# Patient Record
Sex: Male | Born: 1950
Health system: Southern US, Community
[De-identification: ages and names within clinical notes are randomized; demographics above are authoritative.]

## PROBLEM LIST (undated history)

## (undated) DIAGNOSIS — L719 Rosacea, unspecified: Secondary | ICD-10-CM

## (undated) DIAGNOSIS — N400 Enlarged prostate without lower urinary tract symptoms: Secondary | ICD-10-CM

## (undated) DIAGNOSIS — I5020 Unspecified systolic (congestive) heart failure: Secondary | ICD-10-CM

## (undated) DIAGNOSIS — K2100 Gastro-esophageal reflux disease with esophagitis, without bleeding: Secondary | ICD-10-CM

## (undated) DIAGNOSIS — Z87442 Personal history of urinary calculi: Secondary | ICD-10-CM

## (undated) DIAGNOSIS — N200 Calculus of kidney: Secondary | ICD-10-CM

## (undated) DIAGNOSIS — Z8619 Personal history of other infectious and parasitic diseases: Secondary | ICD-10-CM

## (undated) DIAGNOSIS — H409 Unspecified glaucoma: Secondary | ICD-10-CM

## (undated) HISTORY — PX: TONSILLECTOMY: SUR1361

## (undated) HISTORY — DX: Gastro-esophageal reflux disease with esophagitis: K21.0

## (undated) HISTORY — PX: TONSILLECTOMY AND ADENOIDECTOMY: SHX28

## (undated) HISTORY — PX: COLON SURGERY: SHX602

## (undated) HISTORY — DX: Rosacea, unspecified: L71.9

## (undated) HISTORY — DX: Gastro-esophageal reflux disease with esophagitis, without bleeding: K21.00

## (undated) HISTORY — DX: Unspecified systolic (congestive) heart failure: I50.20

## (undated) HISTORY — DX: Benign prostatic hyperplasia without lower urinary tract symptoms: N40.0

## (undated) HISTORY — DX: Calculus of kidney: N20.0

## (undated) HISTORY — DX: Unspecified glaucoma: H40.9

## (undated) HISTORY — PX: COLONOSCOPY: SHX174

---
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
## 1992-08-26 HISTORY — PX: KNEE SURGERY: SHX244

## 2003-08-27 HISTORY — PX: CHOLECYSTECTOMY: SHX55

## 2003-10-17 ENCOUNTER — Encounter: Admission: RE | Admit: 2003-10-17 | Discharge: 2003-10-17 | Payer: Self-pay | Admitting: Family Medicine

## 2003-12-12 ENCOUNTER — Observation Stay (HOSPITAL_COMMUNITY): Admission: RE | Admit: 2003-12-12 | Discharge: 2003-12-13 | Payer: Self-pay | Admitting: Surgery

## 2003-12-19 ENCOUNTER — Observation Stay (HOSPITAL_COMMUNITY): Admission: EM | Admit: 2003-12-19 | Discharge: 2003-12-20 | Payer: Self-pay | Admitting: Emergency Medicine

## 2004-08-13 ENCOUNTER — Ambulatory Visit: Payer: Self-pay | Admitting: Family Medicine

## 2004-08-26 HISTORY — PX: RIGHT COLECTOMY: SHX853

## 2005-07-22 ENCOUNTER — Ambulatory Visit: Payer: Self-pay | Admitting: General Surgery

## 2005-08-07 ENCOUNTER — Inpatient Hospital Stay: Payer: Self-pay | Admitting: General Surgery

## 2006-03-26 ENCOUNTER — Ambulatory Visit: Payer: Self-pay | Admitting: Gastroenterology

## 2006-08-08 ENCOUNTER — Ambulatory Visit: Payer: Self-pay | Admitting: General Surgery

## 2007-05-03 DIAGNOSIS — B351 Tinea unguium: Secondary | ICD-10-CM | POA: Insufficient documentation

## 2008-08-26 HISTORY — PX: UPPER GI ENDOSCOPY: SHX6162

## 2008-08-26 HISTORY — PX: OTHER SURGICAL HISTORY: SHX169

## 2008-12-05 ENCOUNTER — Ambulatory Visit: Payer: Self-pay | Admitting: General Surgery

## 2008-12-05 HISTORY — PX: OTHER SURGICAL HISTORY: SHX169

## 2008-12-13 ENCOUNTER — Ambulatory Visit: Payer: Self-pay | Admitting: General Surgery

## 2008-12-13 LAB — HM COLONOSCOPY

## 2009-01-02 ENCOUNTER — Ambulatory Visit: Payer: Self-pay | Admitting: General Surgery

## 2009-01-12 ENCOUNTER — Ambulatory Visit: Payer: Self-pay | Admitting: General Surgery

## 2009-05-09 ENCOUNTER — Ambulatory Visit: Payer: Self-pay | Admitting: General Surgery

## 2009-05-10 DIAGNOSIS — K222 Esophageal obstruction: Secondary | ICD-10-CM | POA: Insufficient documentation

## 2009-05-23 ENCOUNTER — Ambulatory Visit: Payer: Self-pay | Admitting: General Surgery

## 2010-09-18 IMAGING — CT CT ABD-PELV W/ CM
1 of 3 series · 13 of 32 positions shown, 18 images · non-contrast
Comparison: none

REASON FOR EXAM: ABD WALL MASS HX COLON CA [id number]
COMMENTS:

[Series 2: abdomen · axial · 0.83mm/px · z∈[-199,+191]mm · 13 of 90 slices shown, 18 images]
[im 6/90  soft-tissue]
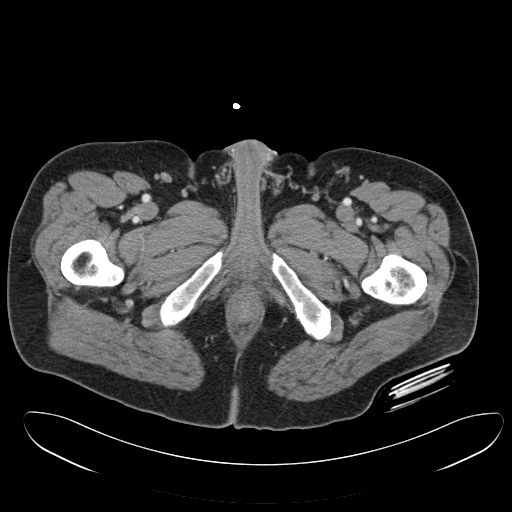
[im 6/90  bone]
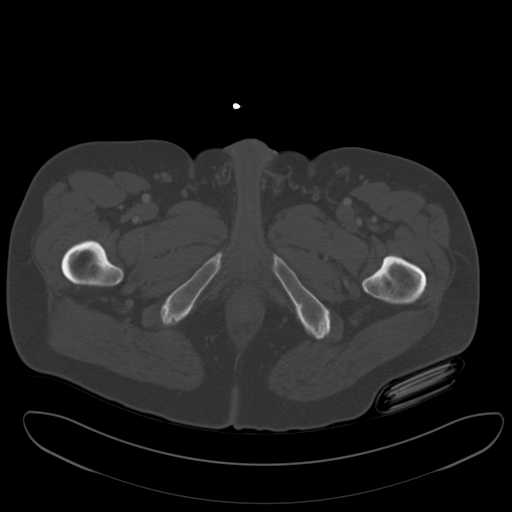
[im 16/90  soft-tissue]
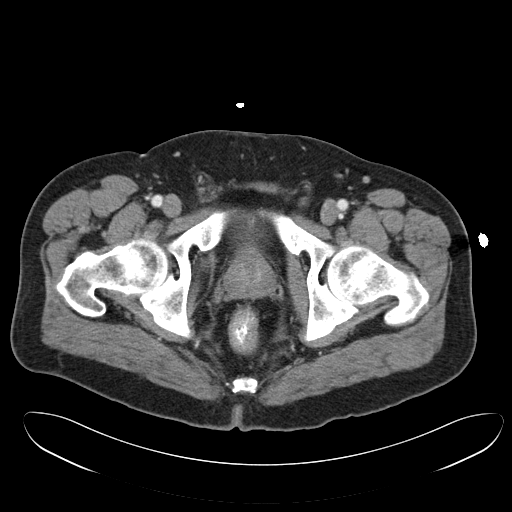
[im 21/90  soft-tissue]
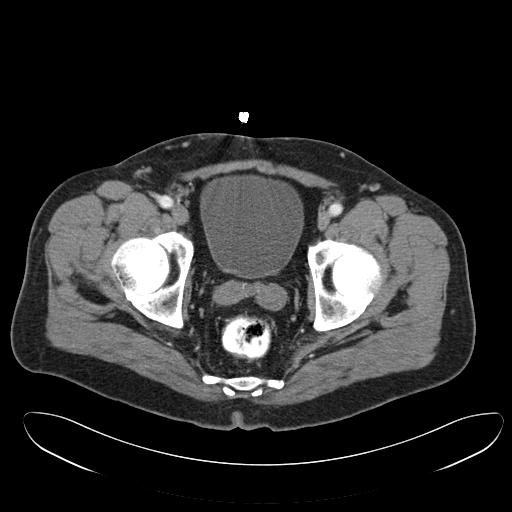
[im 27/90  soft-tissue]
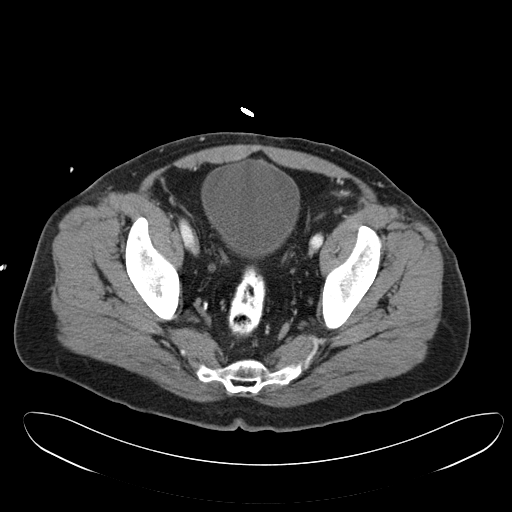
[im 37/90  soft-tissue]
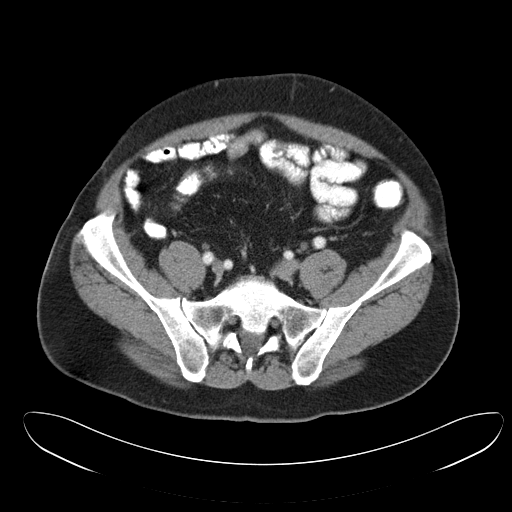
[im 42/90  soft-tissue]
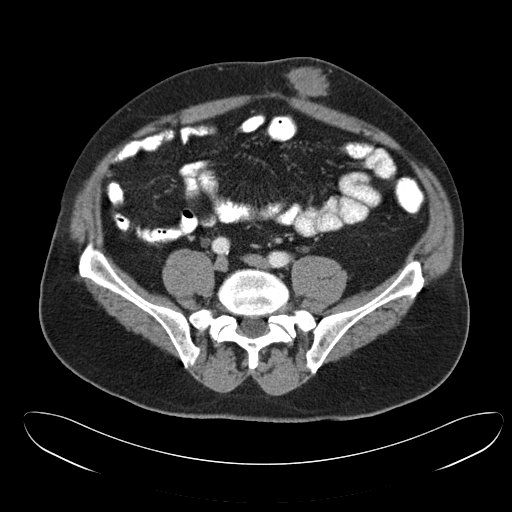
[im 48/90  soft-tissue]
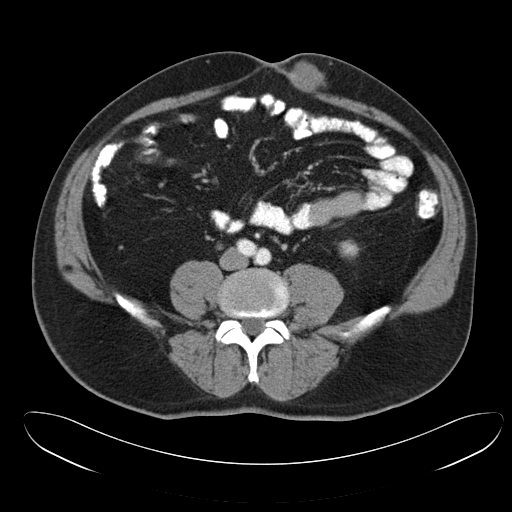
[im 58/90  soft-tissue]
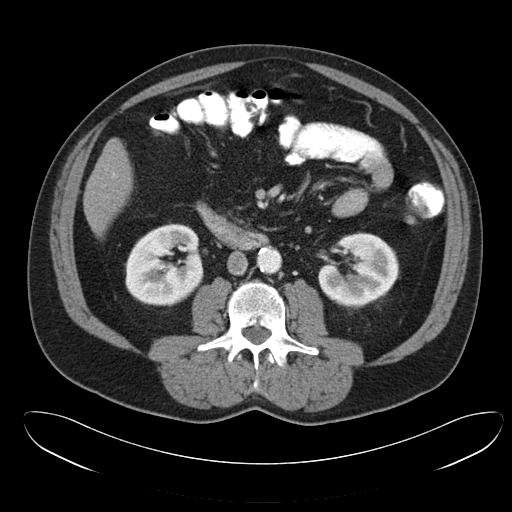
[im 63/90  soft-tissue]
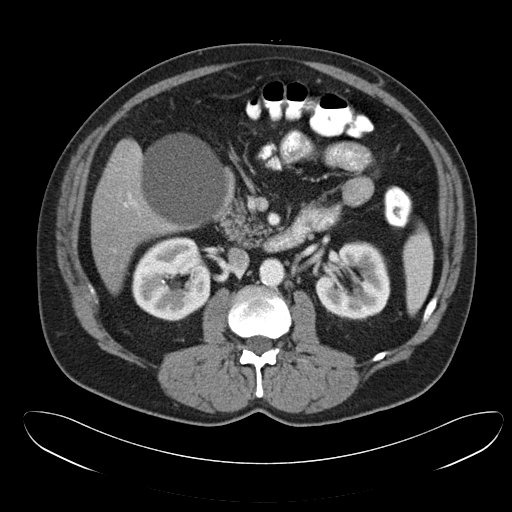
[im 63/90  bone]
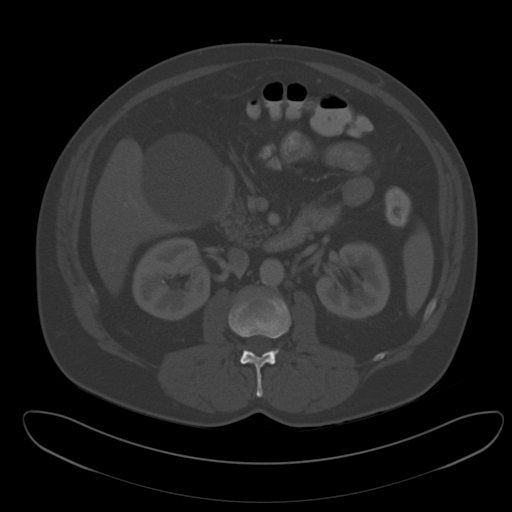
[im 69/90  soft-tissue]
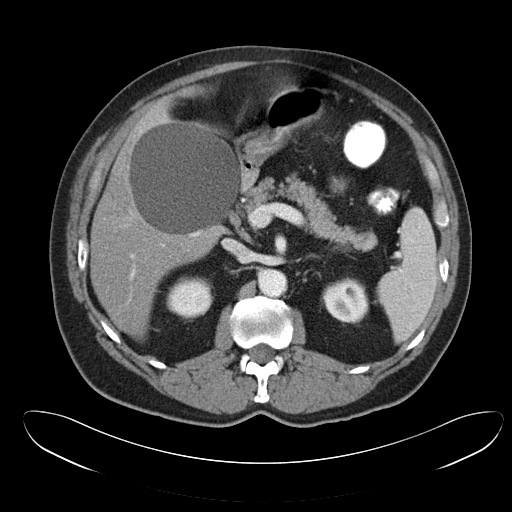
[im 69/90  lung]
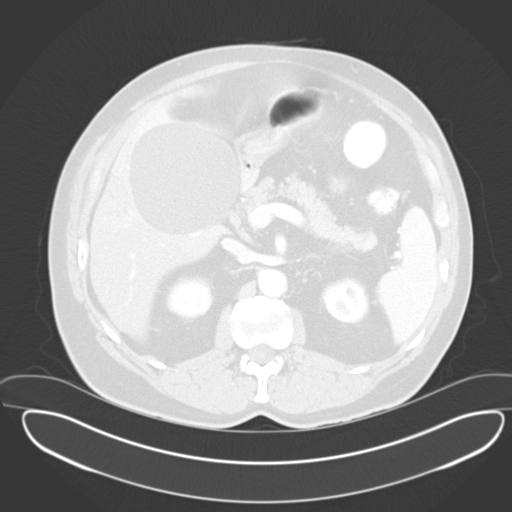
[im 74/90  lung]
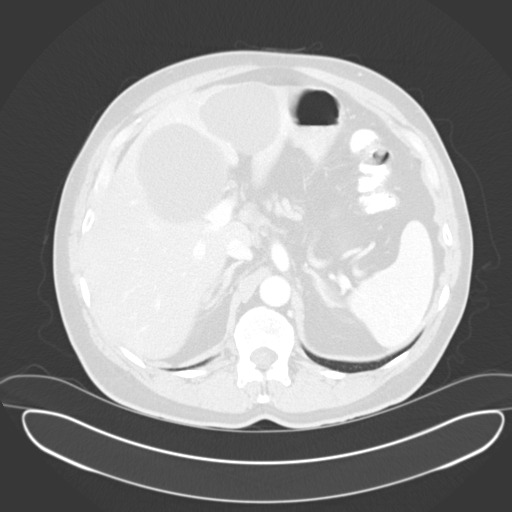
[im 79/90  soft-tissue]
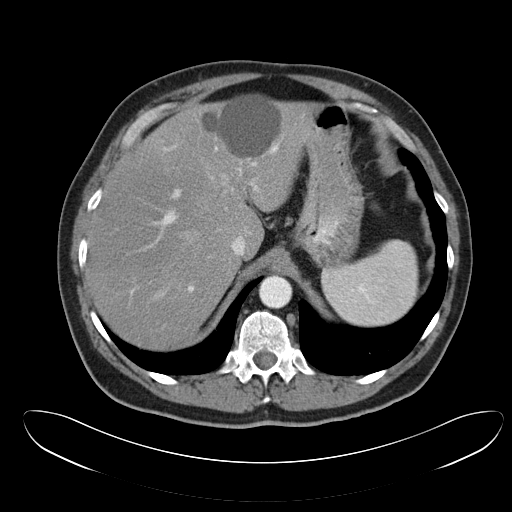
[im 79/90  lung]
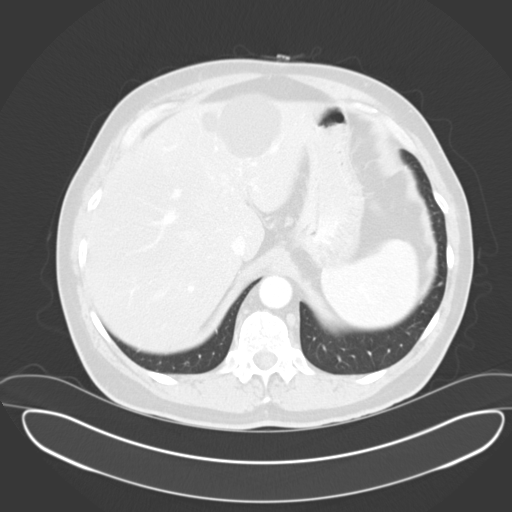
[im 84/90  soft-tissue]
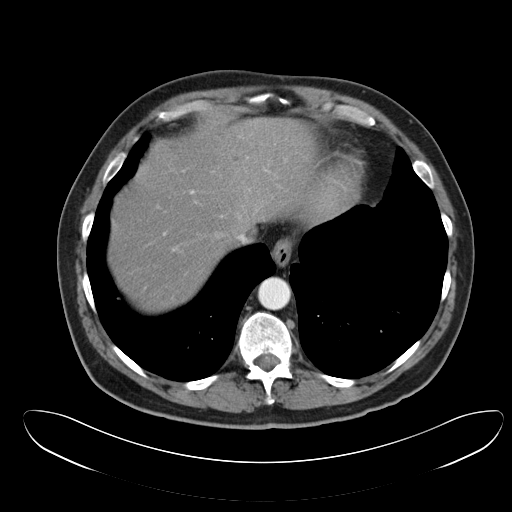
[im 84/90  lung]
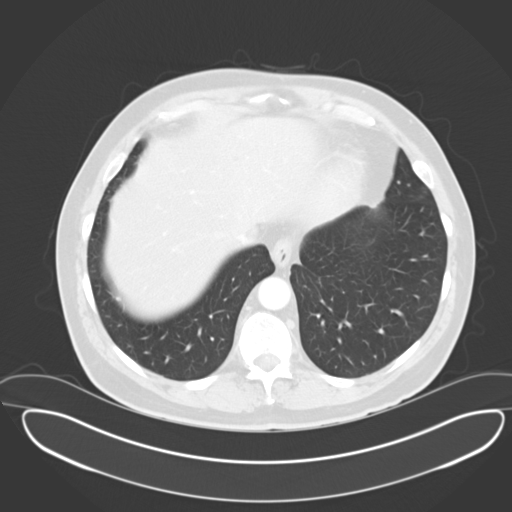

[13 of 32 positions shown; findings below may reference images not displayed]

PROCEDURE:     CT  - CT ABDOMEN / PELVIS  W  - December 05, 2008  [DATE]

RESULT:     Helical, 5 mm sections were obtained from the lung bases through
the pubic symphysis status post intravenous administration of 100 ml of
Dsovue-AZR and oral contrast.

Evaluation of the lung bases demonstrates no gross abnormalities.

The liver demonstrates multiple, low attenuating foci scattered throughout
the liver. Immediate IV and delayed imaging was obtained of the liver. The
areas in question demonstrate Hounsfield units on the post and delayed
imaging consistent with cysts and likely represent large liver cysts. The
largest measures 9 x 9 cm and projects within the inferior aspect of the
right lobe of the liver. No further liver masses are identified.

The spleen, adrenals, pancreas and kidneys are unremarkable. There is no CT
evidence of intra-abdominal free fluid, drainable loculated fluid
collections, masses or adenopathy or evidence of an abdominal aortic
aneurysm. A 2.42 x 3.98 cm soft tissue appearing mass projects just lateral
to the umbilicus on the left. This area demonstrates Hounsfield units of 40
and 57. Considering the patient's history, differential considerations are a
post op seroma complex or a resolving hematoma, if clinically appropriate,
if chronologically appropriate with the patient's surgical history.
Alternatively, more ominous etiologies such as a soft tissue mass
particularly considering the patient's history of colon neoplasm cannot be
excluded. No further masses, free fluid or drainable, loculated fluid
collections are identified. Evaluation of the pelvis demonstrates no
evidence of free fluid, drainable loculated fluid collections, masses or
adenopathy. There is no CT evidence of bowel obstruction or diverticulitis.
IMPRESSION: 1.     Soft tissue mass projecting in the region of palpable concern.
Differential considerations are benign etiologies which may be primarily
post surgical though more ominous etiologies as described above,
particularly if chronologically the benign etiologies do not appear
appropriate is also a diagnostic consideration.
2.     Cysts involving the liver.
3.     No further focal or acute abnormalities.
4.     If clinically warranted, tissue sampling of the periumbilical mass is
recommended.

## 2012-02-13 LAB — HM COLONOSCOPY: HM Colonoscopy: NORMAL

## 2013-03-11 ENCOUNTER — Encounter: Payer: Self-pay | Admitting: *Deleted

## 2014-07-06 ENCOUNTER — Ambulatory Visit: Payer: Self-pay | Admitting: Family Medicine

## 2014-07-11 ENCOUNTER — Ambulatory Visit: Payer: Self-pay | Admitting: Internal Medicine

## 2014-07-20 ENCOUNTER — Ambulatory Visit: Payer: Self-pay | Admitting: Interventional Cardiology

## 2014-07-25 DIAGNOSIS — E782 Mixed hyperlipidemia: Secondary | ICD-10-CM | POA: Insufficient documentation

## 2014-07-26 HISTORY — PX: CARDIAC CATHETERIZATION: SHX172

## 2014-08-26 DIAGNOSIS — I5020 Unspecified systolic (congestive) heart failure: Secondary | ICD-10-CM

## 2014-08-26 HISTORY — DX: Unspecified systolic (congestive) heart failure: I50.20

## 2014-12-12 DIAGNOSIS — I10 Essential (primary) hypertension: Secondary | ICD-10-CM | POA: Insufficient documentation

## 2014-12-14 LAB — HEPATIC FUNCTION PANEL
ALT: 36 U/L (ref 10–40)
AST: 24 U/L (ref 14–40)

## 2014-12-14 LAB — PSA: PSA: 2.3

## 2014-12-14 LAB — BASIC METABOLIC PANEL
BUN: 15 mg/dL (ref 4–21)
Creatinine: 0.8 mg/dL (ref 0.6–1.3)
GLUCOSE: 102 mg/dL
POTASSIUM: 4.9 mmol/L (ref 3.4–5.3)
SODIUM: 137 mmol/L (ref 137–147)

## 2014-12-14 LAB — CBC AND DIFFERENTIAL
HCT: 43 % (ref 41–53)
Hemoglobin: 15 g/dL (ref 13.5–17.5)
PLATELETS: 316 10*3/uL (ref 150–399)
WBC: 7.1 10^3/mL

## 2014-12-14 LAB — LIPID PANEL
Cholesterol: 173 mg/dL (ref 0–200)
HDL: 57 mg/dL (ref 35–70)
LDL CALC: 94 mg/dL
Triglycerides: 111 mg/dL (ref 40–160)

## 2014-12-14 LAB — TSH: TSH: 1.38 u[IU]/mL (ref 0.41–5.90)

## 2015-01-30 ENCOUNTER — Other Ambulatory Visit: Payer: Self-pay | Admitting: Family Medicine

## 2015-04-27 ENCOUNTER — Ambulatory Visit (INDEPENDENT_AMBULATORY_CARE_PROVIDER_SITE_OTHER): Payer: BLUE CROSS/BLUE SHIELD | Admitting: Family Medicine

## 2015-04-27 ENCOUNTER — Encounter: Payer: Self-pay | Admitting: Family Medicine

## 2015-04-27 VITALS — BP 122/74 | HR 54 | Temp 98.8°F | Resp 16 | Ht 73.0 in | Wt 223.0 lb

## 2015-04-27 DIAGNOSIS — Z85038 Personal history of other malignant neoplasm of large intestine: Secondary | ICD-10-CM | POA: Insufficient documentation

## 2015-04-27 DIAGNOSIS — R0602 Shortness of breath: Secondary | ICD-10-CM | POA: Insufficient documentation

## 2015-04-27 DIAGNOSIS — Z8619 Personal history of other infectious and parasitic diseases: Secondary | ICD-10-CM

## 2015-04-27 DIAGNOSIS — R0609 Other forms of dyspnea: Secondary | ICD-10-CM

## 2015-04-27 DIAGNOSIS — I5022 Chronic systolic (congestive) heart failure: Secondary | ICD-10-CM

## 2015-04-27 DIAGNOSIS — R5383 Other fatigue: Secondary | ICD-10-CM | POA: Insufficient documentation

## 2015-04-27 DIAGNOSIS — J329 Chronic sinusitis, unspecified: Secondary | ICD-10-CM

## 2015-04-27 DIAGNOSIS — R079 Chest pain, unspecified: Secondary | ICD-10-CM | POA: Insufficient documentation

## 2015-04-27 DIAGNOSIS — Z87442 Personal history of urinary calculi: Secondary | ICD-10-CM | POA: Insufficient documentation

## 2015-04-27 DIAGNOSIS — N400 Enlarged prostate without lower urinary tract symptoms: Secondary | ICD-10-CM | POA: Insufficient documentation

## 2015-04-27 DIAGNOSIS — I5020 Unspecified systolic (congestive) heart failure: Secondary | ICD-10-CM | POA: Insufficient documentation

## 2015-04-27 DIAGNOSIS — H9319 Tinnitus, unspecified ear: Secondary | ICD-10-CM | POA: Insufficient documentation

## 2015-04-27 HISTORY — DX: Personal history of other infectious and parasitic diseases: Z86.19

## 2015-04-27 MED ORDER — AMOXICILLIN 500 MG PO CAPS: 1000.0000 mg | ORAL_CAPSULE | Freq: Two times a day (BID) | ORAL | Status: AC

## 2015-04-27 NOTE — Progress Notes (Signed)
Patient: Derek Jackson Male    DOB: [birth year]   64 y.o.   MRN: [id number] Visit Date: 04/27/2015  Today's Provider: Lelon Huh, MD   Chief Complaint  Patient presents with  . Cough  . Sinusitis   Subjective:    Cough This is a new problem. The current episode started 1 to 4 weeks ago (3 weeks ago). The problem has been gradually worsening. The problem occurs every few minutes. The cough is productive of sputum. Associated symptoms include headaches, a sore throat and wheezing. Pertinent negatives include no chest pain, chills, ear congestion, ear pain, fever, shortness of breath or sweats. The symptoms are aggravated by other (first thing in the morning). He has tried OTC cough suppressant for the symptoms. The treatment provided mild relief.  Sinusitis Associated symptoms include coughing, headaches, sinus pressure and a sore throat. Pertinent negatives include no chills, ear pain or shortness of breath.      No Known Allergies Previous Medications   LISINOPRIL (PRINIVIL,ZESTRIL) 20 MG TABLET    Take 1 tablet by mouth daily.   METOPROLOL TARTRATE (LOPRESSOR) 25 MG TABLET    Take 1 tablet by mouth 2 (two) times daily.   RANITIDINE (ZANTAC) 300 MG TABLET    TAKE ONE TABLET BY MOUTH TWICE DAILY   SPIRONOLACTONE (ALDACTONE) 25 MG TABLET    Take 25 mg by mouth daily.   TERBINAFINE (LAMISIL) 250 MG TABLET    Take 2 tablets by mouth. Daily for 7 days each month    Review of Systems  Constitutional: Negative for fever and chills.  HENT: Positive for sinus pressure and sore throat. Negative for ear pain and trouble swallowing.   Respiratory: Positive for cough and wheezing. Negative for shortness of breath.   Cardiovascular: Negative for chest pain and palpitations.  Neurological: Positive for light-headedness and headaches.    Social History  Substance Use Topics  . Smoking status: Former Smoker    Quit date: 04/26/1990  . Smokeless tobacco: Never Used  . Alcohol Use:  0.0 oz/week    0 Standard drinks or equivalent per week   Objective:   BP 122/74 mmHg  Pulse 54  Temp(Src) 98.8 F (37.1 C) (Oral)  Resp 16  Ht 6\' 1"  (1.854 m)  Wt 223 lb (101.152 kg)  BMI 29.43 kg/m2  SpO2 96%  Physical Exam  General Appearance:    Alert, cooperative, no distress  HENT:   bilateral TM normal without fluid or infection, neck without nodes, throat normal without erythema or exudate and bilateral frontal sinus tender. Bilateral frontal sinus tenderness.   Eyes:    PERRL, conjunctiva/corneas clear, EOM's intact       Lungs:     Clear to auscultation bilaterally, respirations unlabored  Heart:    Regular rate and rhythm  Neurologic:   Awake, alert, oriented x 3. No apparent focal neurological           defect.           Assessment & Plan:     1. Sinusitis, unspecified chronicity, unspecified location  - amoxicillin (AMOXIL) 500 MG capsule; Take 2 capsules (1,000 mg total) by mouth 2 (two) times daily.  Dispense: 40 capsule; Refill: 0  2. Chronic systolic heart failure He was prescribed aldactone by Dr. Nehemiah Massed last week and is supposed to have renal functions checked after 4 weeks, He prefers to have it drawn at [place] we can order lab around September 25th if he  likes, and will send copy to Dr. Nehemiah Massed.        Lelon Huh, MD  [facility]

## 2015-05-15 ENCOUNTER — Telehealth: Payer: Self-pay | Admitting: Family Medicine

## 2015-05-15 ENCOUNTER — Other Ambulatory Visit: Payer: Self-pay | Admitting: Family Medicine

## 2015-05-15 DIAGNOSIS — I5022 Chronic systolic (congestive) heart failure: Secondary | ICD-10-CM

## 2015-05-15 NOTE — Telephone Encounter (Signed)
Please advise patient it is time to check kidney function panel. Have printed lab order. Please leave at front desk for patient to pick up. Does not need to fast.

## 2015-05-15 NOTE — Telephone Encounter (Signed)
-----   Message from Birdie Sons, MD sent at 04/27/2015 10:29 AM EDT ----- Regarding: make sure checks renal panel before 9-25 Since Dr. Raliegh Ip started on spironolactone

## 2015-05-16 NOTE — Telephone Encounter (Signed)
Printed lab requisitions as directed below and placed at front desk.  Advised pt as directed below, pt verbalized fully understanding.  Thanks,

## 2015-05-17 LAB — RENAL FUNCTION PANEL
Albumin: 4.4 g/dL (ref 3.6–4.8)
BUN/Creatinine Ratio: 19 (ref 10–22)
BUN: 14 mg/dL (ref 8–27)
CHLORIDE: 98 mmol/L (ref 97–108)
CO2: 24 mmol/L (ref 18–29)
Calcium: 9.9 mg/dL (ref 8.6–10.2)
Creatinine, Ser: 0.75 mg/dL — ABNORMAL LOW (ref 0.76–1.27)
GFR, EST AFRICAN AMERICAN: 112 mL/min/{1.73_m2} (ref >59)
GFR, EST NON AFRICAN AMERICAN: 97 mL/min/{1.73_m2} (ref >59)
GLUCOSE: 118 mg/dL — AB (ref 65–99)
PHOSPHORUS: 2.8 mg/dL (ref 2.5–4.5)
POTASSIUM: 4.5 mmol/L (ref 3.5–5.2)
SODIUM: 137 mmol/L (ref 134–144)

## 2015-06-09 ENCOUNTER — Other Ambulatory Visit: Payer: Self-pay | Admitting: Family Medicine

## 2015-11-23 ENCOUNTER — Other Ambulatory Visit: Payer: Self-pay

## 2015-11-23 MED ORDER — RANITIDINE HCL 300 MG PO TABS: 300.0000 mg | ORAL_TABLET | Freq: Two times a day (BID) | ORAL | Status: DC

## 2015-11-23 NOTE — Telephone Encounter (Signed)
Chris from [facility] is requesting Zantac to be filled for patient. He reports the reason they did not send a e-fill is because he is new to the pharmacy. Last time medication was filled was 12/14/2014. Patient is requesting 3 month supply qty of 180 capsules. Thanks!

## 2015-12-18 ENCOUNTER — Encounter: Payer: Self-pay | Admitting: Family Medicine

## 2015-12-18 ENCOUNTER — Ambulatory Visit (INDEPENDENT_AMBULATORY_CARE_PROVIDER_SITE_OTHER): Payer: PPO | Admitting: Family Medicine

## 2015-12-18 VITALS — BP 94/60 | HR 63 | Temp 98.3°F | Resp 16 | Ht 73.5 in | Wt 224.0 lb

## 2015-12-18 DIAGNOSIS — Z0000 Encounter for general adult medical examination without abnormal findings: Secondary | ICD-10-CM | POA: Diagnosis not present

## 2015-12-18 DIAGNOSIS — R0609 Other forms of dyspnea: Secondary | ICD-10-CM

## 2015-12-18 DIAGNOSIS — Z125 Encounter for screening for malignant neoplasm of prostate: Secondary | ICD-10-CM

## 2015-12-18 DIAGNOSIS — K2100 Gastro-esophageal reflux disease with esophagitis, without bleeding: Secondary | ICD-10-CM

## 2015-12-18 DIAGNOSIS — R5382 Chronic fatigue, unspecified: Secondary | ICD-10-CM

## 2015-12-18 DIAGNOSIS — Z23 Encounter for immunization: Secondary | ICD-10-CM

## 2015-12-18 DIAGNOSIS — I5022 Chronic systolic (congestive) heart failure: Secondary | ICD-10-CM | POA: Diagnosis not present

## 2015-12-18 NOTE — Progress Notes (Signed)
Patient: Derek Jackson, Male    DOB: 1951/01/19, 65 y.o.   MRN: [id number] Visit Date: 12/18/2015  Today's Provider: Lelon Huh, MD   Chief Complaint  Patient presents with  . Annual Exam  . Gastroesophageal Reflux    follow up  . Congestive Heart Failure    follow up  . Benign Prostatic Hypertrophy    follow up   Subjective:   Yearly physical   Derek Jackson is a 65 y.o. male who presents today for his Annual fphysical. He feels fairly well. He reports exercising 4 days a week walking. He reports he is sleeping well.   Follow up Reflux: Last office visit was 1 year ago and no changes were made. Patient reports good compliance with treatment. He states he retired last month, but is still working 3 days a week. He has been more fatigued lately with shortness of breath on exertion. He states Dr. Nehemiah Massed has scheduled echo, aortic ultrasound and stress test.   Follow up CHF: Last office visit was 7 months ago and no changes were made. Patient reports that he has a follow up appointment to see Dr. Nehemiah Massed soon. He states that they will be doing a  Stress test on him.   Review of Systems  Constitutional: Positive for activity change, appetite change and fatigue. Negative for fever and chills.  HENT: Positive for hearing loss, nosebleeds, rhinorrhea, sinus pressure and sneezing. Negative for congestion, ear pain and trouble swallowing.   Eyes: Negative for pain and visual disturbance.  Respiratory: Positive for shortness of breath. Negative for cough and chest tightness.   Cardiovascular: Negative for chest pain, palpitations and leg swelling.  Gastrointestinal: Positive for abdominal distention. Negative for nausea, vomiting, abdominal pain, diarrhea, constipation and blood in stool.  Endocrine: Negative for polydipsia, polyphagia and polyuria.  Genitourinary: Positive for difficulty urinating. Negative for dysuria and flank pain.  Musculoskeletal: Negative for  myalgias, back pain, joint swelling, arthralgias and neck stiffness.  Skin: Negative for color change, rash and wound.  Neurological: Positive for weakness. Negative for dizziness, tremors, seizures, speech difficulty, light-headedness and headaches.  Psychiatric/Behavioral: Negative for behavioral problems, confusion, sleep disturbance, dysphoric mood and decreased concentration. The patient is not nervous/anxious.   All other systems reviewed and are negative.   Social History   Social History  . Marital Status: Single    Spouse Name: N/A  . Number of Children: 3  . Years of Education: N/A   Occupational History  . Employed     Works at [employer]  . Smoking status: Former Smoker -- 1.00 packs/day for 8 years    Types: Cigarettes    Quit date: 04/26/1990  . Smokeless tobacco: Never Used  . Alcohol Use: No  . Drug Use: Yes  . Sexual Activity: Not on file   Other Topics Concern  . Not on file   Social History Narrative    Past Medical History  Diagnosis Date  . Stroke ([place])   . Kidney stone   . Systolic CHF ([place])   . BPH (benign prostatic hypertrophy)   . Esophagitis, reflux   . Rosacea      Patient Active Problem List   Diagnosis Date Noted  . BPH (benign prostatic hypertrophy) 04/27/2015  . Chest pain 04/27/2015  . Dyspnea on exertion 04/27/2015  . Fatigue 04/27/2015  . History of colon cancer 04/27/2015  . History of shingles 04/27/2015  .  History of kidney stones 04/27/2015  . Systolic CHF ([place]) 0000000  . Tinnitus 04/27/2015  . Stricture and stenosis of esophagus 05/10/2009  . Fam hx-ischem heart disease 07/13/2008  . Dermatophytosis, nail 05/03/2007  . Adjustment disorder with mixed anxiety and depressed mood 09/09/2006  . Esophagitis, reflux 06/20/2006  . Rosacea 06/20/2006    Past Surgical History  Procedure Laterality Date  . Upper gi endoscopy  2010  . Colonoscopy  2010  . Knee surgery  1994      Arthroscopy. Surgery by Dr. Marry Guan  . Abdominal wall mass excision  2010    Dr. Bary Castilla  . Right colectomy  2006    Dr.Byrnett  . Cholecystectomy  2005    Lapraroscopic  . Tonsillectomy and adenoidectomy  1960's  . Cardiac catheterization  07/2014    No blockages. per patient one side of heart was not functioning well, CHF  . Ct scan of abdomen  12/05/2008    Soft Tissue mass 2.42cm x 3.98 cm of Abdominal wall lateral to umbilicus, pathology shows fibromatosis. Multiple large liver cysts up to 9cm x 9cm    His family history includes Aneurysm in his brother; CAD in his father; Colon polyps in his father; Congestive Heart Failure in his mother; Dementia in his father; Diabetes in his father and paternal grandmother; Heart attack in his maternal grandfather and paternal grandfather; Heart disease in his brother; Stroke in his father.    Previous Medications   LISINOPRIL (PRINIVIL,ZESTRIL) 20 MG TABLET    Take 1 tablet by mouth daily.   METOPROLOL TARTRATE (LOPRESSOR) 25 MG TABLET    Take 1 tablet by mouth 2 (two) times daily.   RANITIDINE (ZANTAC) 300 MG TABLET    Take 1 tablet (300 mg total) by mouth 2 (two) times daily.   SPIRONOLACTONE (ALDACTONE) 25 MG TABLET    Take 25 mg by mouth daily.    Patient Care Team: Birdie Sons, MD as PCP - General (Family Medicine) Corey Skains, MD as Consulting Physician (Internal Medicine) Robert Bellow, MD (General Surgery)     Objective:   Vitals: BP 94/60 mmHg  Pulse 63  Temp(Src) 98.3 F (36.8 C) (Oral)  Resp 16  Ht 6' 1.5" (1.867 m)  Wt 224 lb (101.606 kg)  BMI 29.15 kg/m2  SpO2 96%  Physical Exam   General Appearance:    Alert, cooperative, no distress, appears stated age  Head:    Normocephalic, without obvious abnormality, atraumatic  Eyes:    PERRL, conjunctiva/corneas clear, EOM's intact, fundi    benign, both eyes       Ears:    Normal TM's and external ear canals, both ears  Nose:   Nares normal, septum  midline, mucosa normal, no drainage   or sinus tenderness  Throat:   Lips, mucosa, and tongue normal; teeth and gums normal  Neck:   Supple, symmetrical, trachea midline, no adenopathy;       thyroid:  No enlargement/tenderness/nodules; no carotid   bruit or JVD  Back:     Symmetric, no curvature, ROM normal, no CVA tenderness  Lungs:     Clear to auscultation bilaterally, respirations unlabored  Chest wall:    No tenderness or deformity  Heart:    Regular rate and rhythm, S1 and S2 normal, no murmur, rub   or gallop  Abdomen:     Soft, non-tender, bowel sounds active all four quadrants,    no masses, no organomegaly  Genitalia:  deferred  Rectal:    deferred  Extremities:   Extremities normal, atraumatic, no cyanosis or edema  Pulses:   2+ and symmetric all extremities  Skin:   Skin color, texture, turgor normal, no rashes or lesions  Lymph nodes:   Cervical, supraclavicular, and axillary nodes normal  Neurologic:   CNII-XII intact. Normal strength, sensation and reflexes      throughout     Visual Acuity Screening   Right eye Left eye Both eyes  Without correction: 20/40 20/40 20/30   With correction: 20/20 20/13 20/13   Comments: Patient saw all colors   Activities of Daily Living In your present state of health, do you have any difficulty performing the following activities: 12/18/2015  Hearing? N  Vision? N  Difficulty concentrating or making decisions? Y  Walking or climbing stairs? Y  Dressing or bathing? N  Doing errands, shopping? N    Fall Risk Assessment Fall Risk  12/18/2015  Falls in the past year? No     Depression Screen PHQ 2/9 Scores 12/18/2015  PHQ - 2 Score 2  PHQ- 9 Score 4    Cognitive Testing - 6-CIT  Correct? Score   What year is it? yes 0 0 or 4  What month is it? yes 0 0 or 3  Memorize:    Pia Mau,  41,  [place],      What time is it? (within 1 hour) yes 0 0 or 3  Count backwards from 20 yes 0 0, 2, or 4  Name the months  of the year yes 0 0, 2, or 4  Repeat name & address above no 3 0, 2, 4, 6, 8, or 10       TOTAL SCORE  3/28   Interpretation:  Normal  Normal (0-7) Abnormal (8-28)    Audit-C Alcohol Use Screening  Question Answer Points  How often do you have alcoholic drink? 1 times monthly 1  On days you do drink alcohol, how many drinks do you typically consume? 1 or 2 0  How oftey will you drink 6 or more in a total? never 0  Total Score:  1   A score of 3 or more in women, and 4 or more in men indicates increased risk for alcohol abuse, EXCEPT if all of the points are from question 1.   Current Exercise Habits: Home exercise routine, Type of exercise: walking, Time (Minutes): 40, Frequency (Times/Week): 4, Weekly Exercise (Minutes/Week): 160, Intensity: Moderate Exercise limited by: None identified   Assessment & Plan:    Health maintenence Reviewed patient's Family Medical History Reviewed and updated list of patient's medical providers Assessment of cognitive impairment was done Assessed patient's functional ability Established a written schedule for health screening [place] Completed and Reviewed  Exercise Activities and Dietary recommendations Goals    None      Immunization History  Administered Date(s) Administered  . Td 11/25/2003  . Zoster 10/22/2010    Health Maintenance  Topic Date Due  . HIV Screening  05/26/1965  . TETANUS/TDAP  11/24/2013  . INFLUENZA VACCINE  03/26/2016  . PNA vac Low Risk Adult (2 of 2 - PPSV23) 12/17/2016  . COLONOSCOPY  02/12/2017  . ZOSTAVAX  Completed  . Hepatitis C Screening  Completed      Discussed health benefits of physical activity, and encouraged him to engage in regular exercise appropriate for his age and condition.    ------------------------------------------------------------------------------------------------------------  1. Annual physical  exam  - Comprehensive metabolic panel  2. Chronic  systolic congestive heart failure (HCC) Asymptomatic. Compliant with medication.  Continue aggressive risk factor modification.  Continue routine follow up with Dr, Nehemiah Massed - Lipid panel  3. Prostate cancer screening   - PSA  4. Dyspnea on exertion Is already scheduled for echo and cardiology follow up next week.   5. Esophagitis, reflux States that ranitidine continues to work well .   6. Chronic fatigue  - CBC - TSH  7. Need for pneumococcal vaccination  - Pneumococcal conjugate vaccine 13-valent IM

## 2015-12-19 LAB — TSH: TSH: 1.88 u[IU]/mL (ref 0.450–4.500)

## 2015-12-19 LAB — COMPREHENSIVE METABOLIC PANEL
A/G RATIO: 2.3 — AB (ref 1.2–2.2)
ALT: 50 IU/L — AB (ref 0–44)
AST: 33 IU/L (ref 0–40)
Albumin: 4.6 g/dL (ref 3.6–4.8)
Alkaline Phosphatase: 120 IU/L — ABNORMAL HIGH (ref 39–117)
BILIRUBIN TOTAL: 0.5 mg/dL (ref 0.0–1.2)
BUN/Creatinine Ratio: 22 (ref 10–24)
BUN: 21 mg/dL (ref 8–27)
CHLORIDE: 98 mmol/L (ref 96–106)
CO2: 23 mmol/L (ref 18–29)
Calcium: 9.5 mg/dL (ref 8.6–10.2)
Creatinine, Ser: 0.97 mg/dL (ref 0.76–1.27)
GFR, EST AFRICAN AMERICAN: 94 mL/min/{1.73_m2} (ref >59)
GFR, EST NON AFRICAN AMERICAN: 82 mL/min/{1.73_m2} (ref >59)
GLOBULIN, TOTAL: 2 g/dL (ref 1.5–4.5)
Glucose: 110 mg/dL — ABNORMAL HIGH (ref 65–99)
POTASSIUM: 5.5 mmol/L — AB (ref 3.5–5.2)
SODIUM: 136 mmol/L (ref 134–144)
TOTAL PROTEIN: 6.6 g/dL (ref 6.0–8.5)

## 2015-12-19 LAB — CBC
HEMATOCRIT: 44.8 % (ref 37.5–51.0)
HEMOGLOBIN: 15 g/dL (ref 12.6–17.7)
MCH: 30.5 pg (ref 26.6–33.0)
MCHC: 33.5 g/dL (ref 31.5–35.7)
MCV: 91 fL (ref 79–97)
Platelets: 305 10*3/uL (ref 150–379)
RBC: 4.91 x10E6/uL (ref 4.14–5.80)
RDW: 13.8 % (ref 12.3–15.4)
WBC: 6.6 10*3/uL (ref 3.4–10.8)

## 2015-12-19 LAB — LIPID PANEL
CHOL/HDL RATIO: 3.7 ratio (ref 0.0–5.0)
Cholesterol, Total: 179 mg/dL (ref 100–199)
HDL: 49 mg/dL (ref >39)
LDL CALC: 94 mg/dL (ref 0–99)
TRIGLYCERIDES: 179 mg/dL — AB (ref 0–149)
VLDL Cholesterol Cal: 36 mg/dL (ref 5–40)

## 2015-12-19 LAB — PSA: Prostate Specific Ag, Serum: 3 ng/mL (ref 0.0–4.0)

## 2015-12-20 ENCOUNTER — Telehealth: Payer: Self-pay

## 2015-12-20 NOTE — Telephone Encounter (Signed)
-----   Message from Birdie Sons, MD sent at 12/19/2015  4:07 PM EDT ----- His potassium level is a little high at 5.5, should be under 5.2   This is a side effect of spironolactone and lisinopril. AVOIDhigh potassium foods such as bananas and fruit juices, He should discuss medications when he follows up with his cardiologist. Otherwise labs are all completely normal.

## 2015-12-20 NOTE — Telephone Encounter (Signed)
Patient advised as directed below. Patient verbalized understanding.  

## 2016-01-12 DIAGNOSIS — I5022 Chronic systolic (congestive) heart failure: Secondary | ICD-10-CM | POA: Diagnosis not present

## 2016-01-12 DIAGNOSIS — I10 Essential (primary) hypertension: Secondary | ICD-10-CM | POA: Diagnosis not present

## 2016-01-12 DIAGNOSIS — E875 Hyperkalemia: Secondary | ICD-10-CM | POA: Diagnosis not present

## 2016-01-12 DIAGNOSIS — E782 Mixed hyperlipidemia: Secondary | ICD-10-CM | POA: Diagnosis not present

## 2016-01-15 DIAGNOSIS — H2513 Age-related nuclear cataract, bilateral: Secondary | ICD-10-CM | POA: Diagnosis not present

## 2016-01-29 DIAGNOSIS — K7689 Other specified diseases of liver: Secondary | ICD-10-CM | POA: Diagnosis not present

## 2016-01-29 DIAGNOSIS — E782 Mixed hyperlipidemia: Secondary | ICD-10-CM | POA: Diagnosis not present

## 2016-01-29 DIAGNOSIS — I5022 Chronic systolic (congestive) heart failure: Secondary | ICD-10-CM | POA: Diagnosis not present

## 2016-01-29 DIAGNOSIS — I10 Essential (primary) hypertension: Secondary | ICD-10-CM | POA: Diagnosis not present

## 2016-03-04 DIAGNOSIS — K7689 Other specified diseases of liver: Secondary | ICD-10-CM | POA: Diagnosis not present

## 2016-03-05 ENCOUNTER — Other Ambulatory Visit: Payer: Self-pay | Admitting: Student

## 2016-03-05 DIAGNOSIS — K7689 Other specified diseases of liver: Secondary | ICD-10-CM

## 2016-03-11 ENCOUNTER — Ambulatory Visit
Admission: RE | Admit: 2016-03-11 | Discharge: 2016-03-11 | Disposition: A | Discharge status: Home / Self Care | Payer: PPO | Source: Ambulatory Visit | Attending: Student | Admitting: Student

## 2016-03-11 DIAGNOSIS — K7689 Other specified diseases of liver: Secondary | ICD-10-CM | POA: Diagnosis not present

## 2016-04-18 IMAGING — CR DG CHEST 2V
2 series · 2 of 2 positions shown · non-contrast
Comparison: None.

CLINICAL DATA: Shortness of breath upon exertion. ; no history of
cardiopulmonary abnormality; nonsmoker

EXAM:
CHEST  2 VIEW

[left lateral]
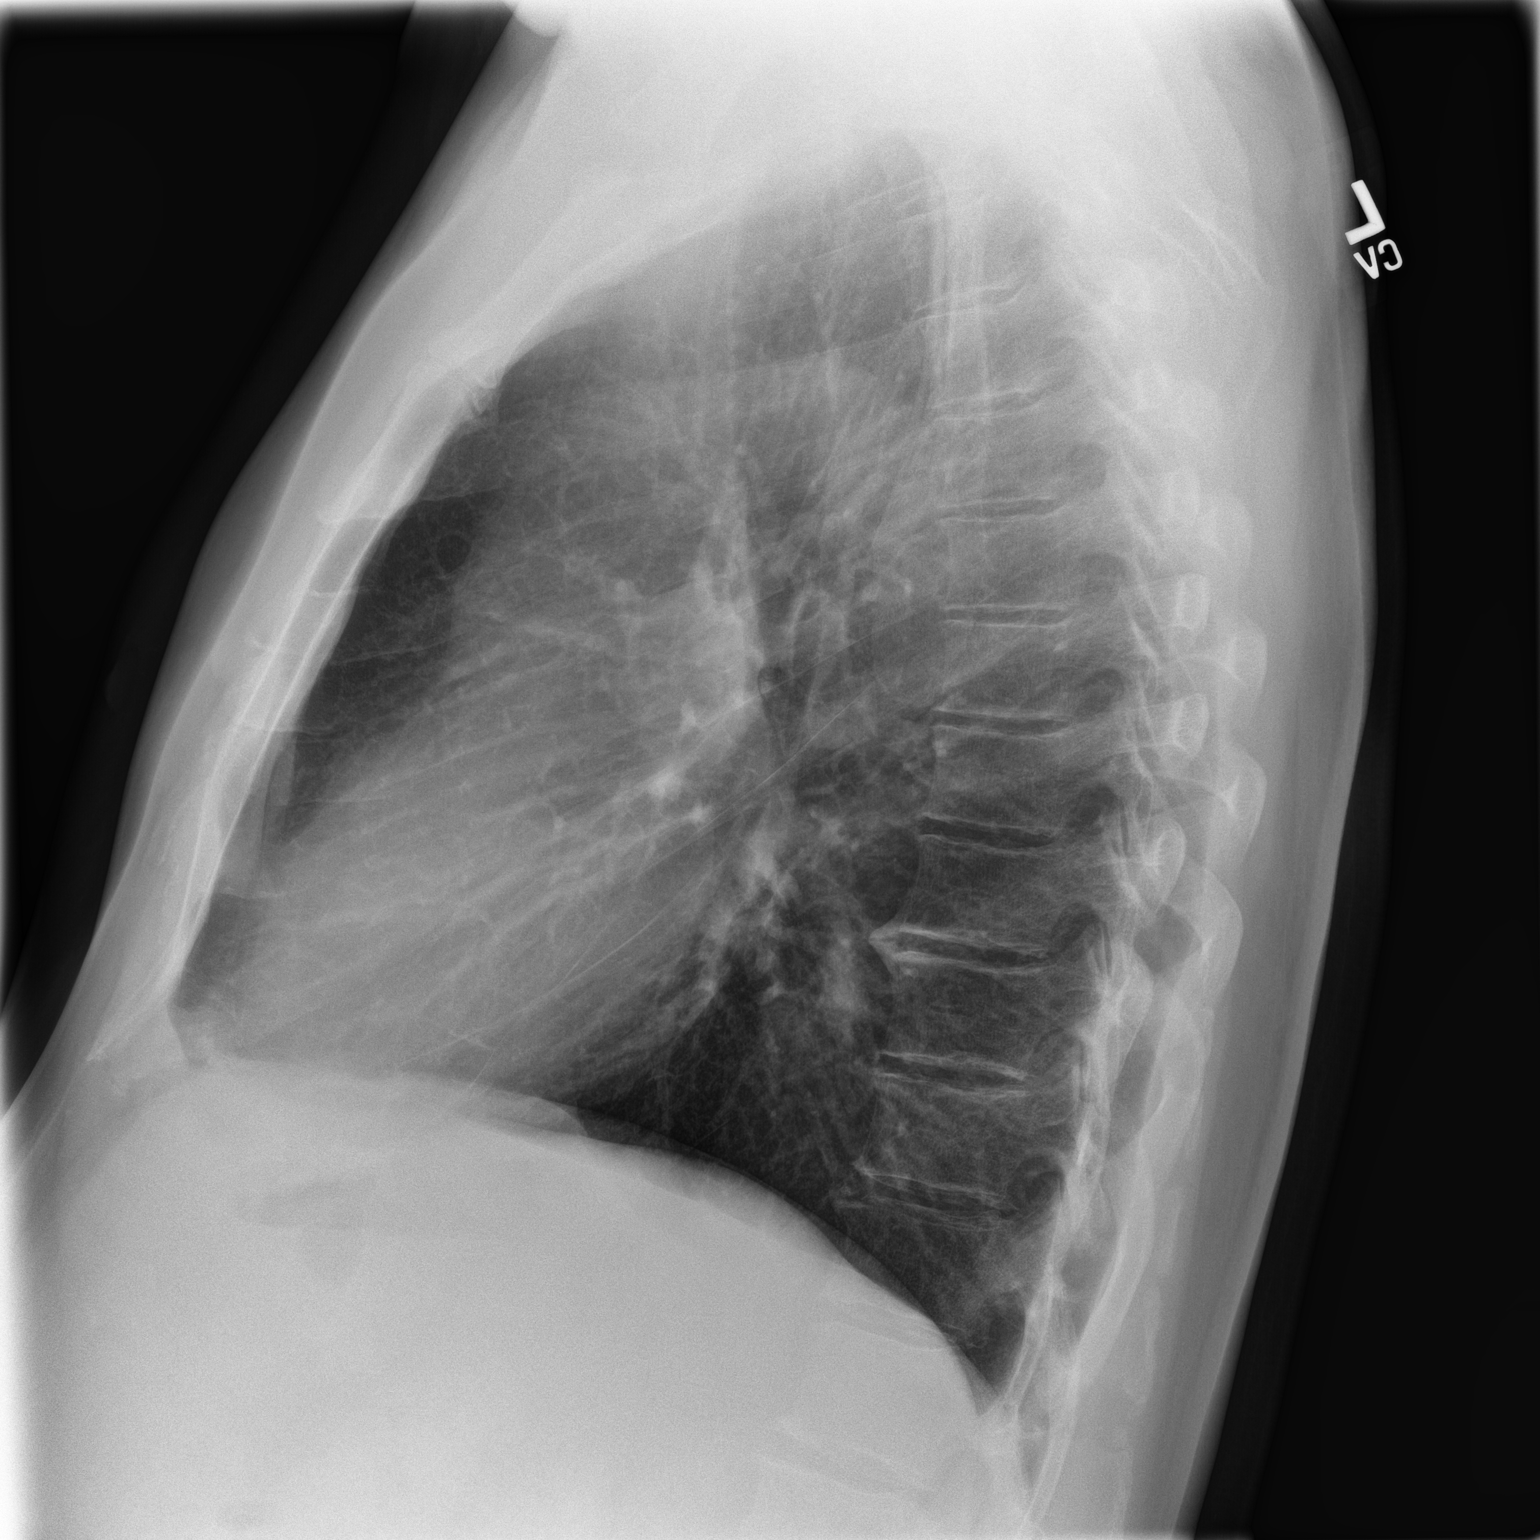

[kdxr chest pa (or ap) and lat]
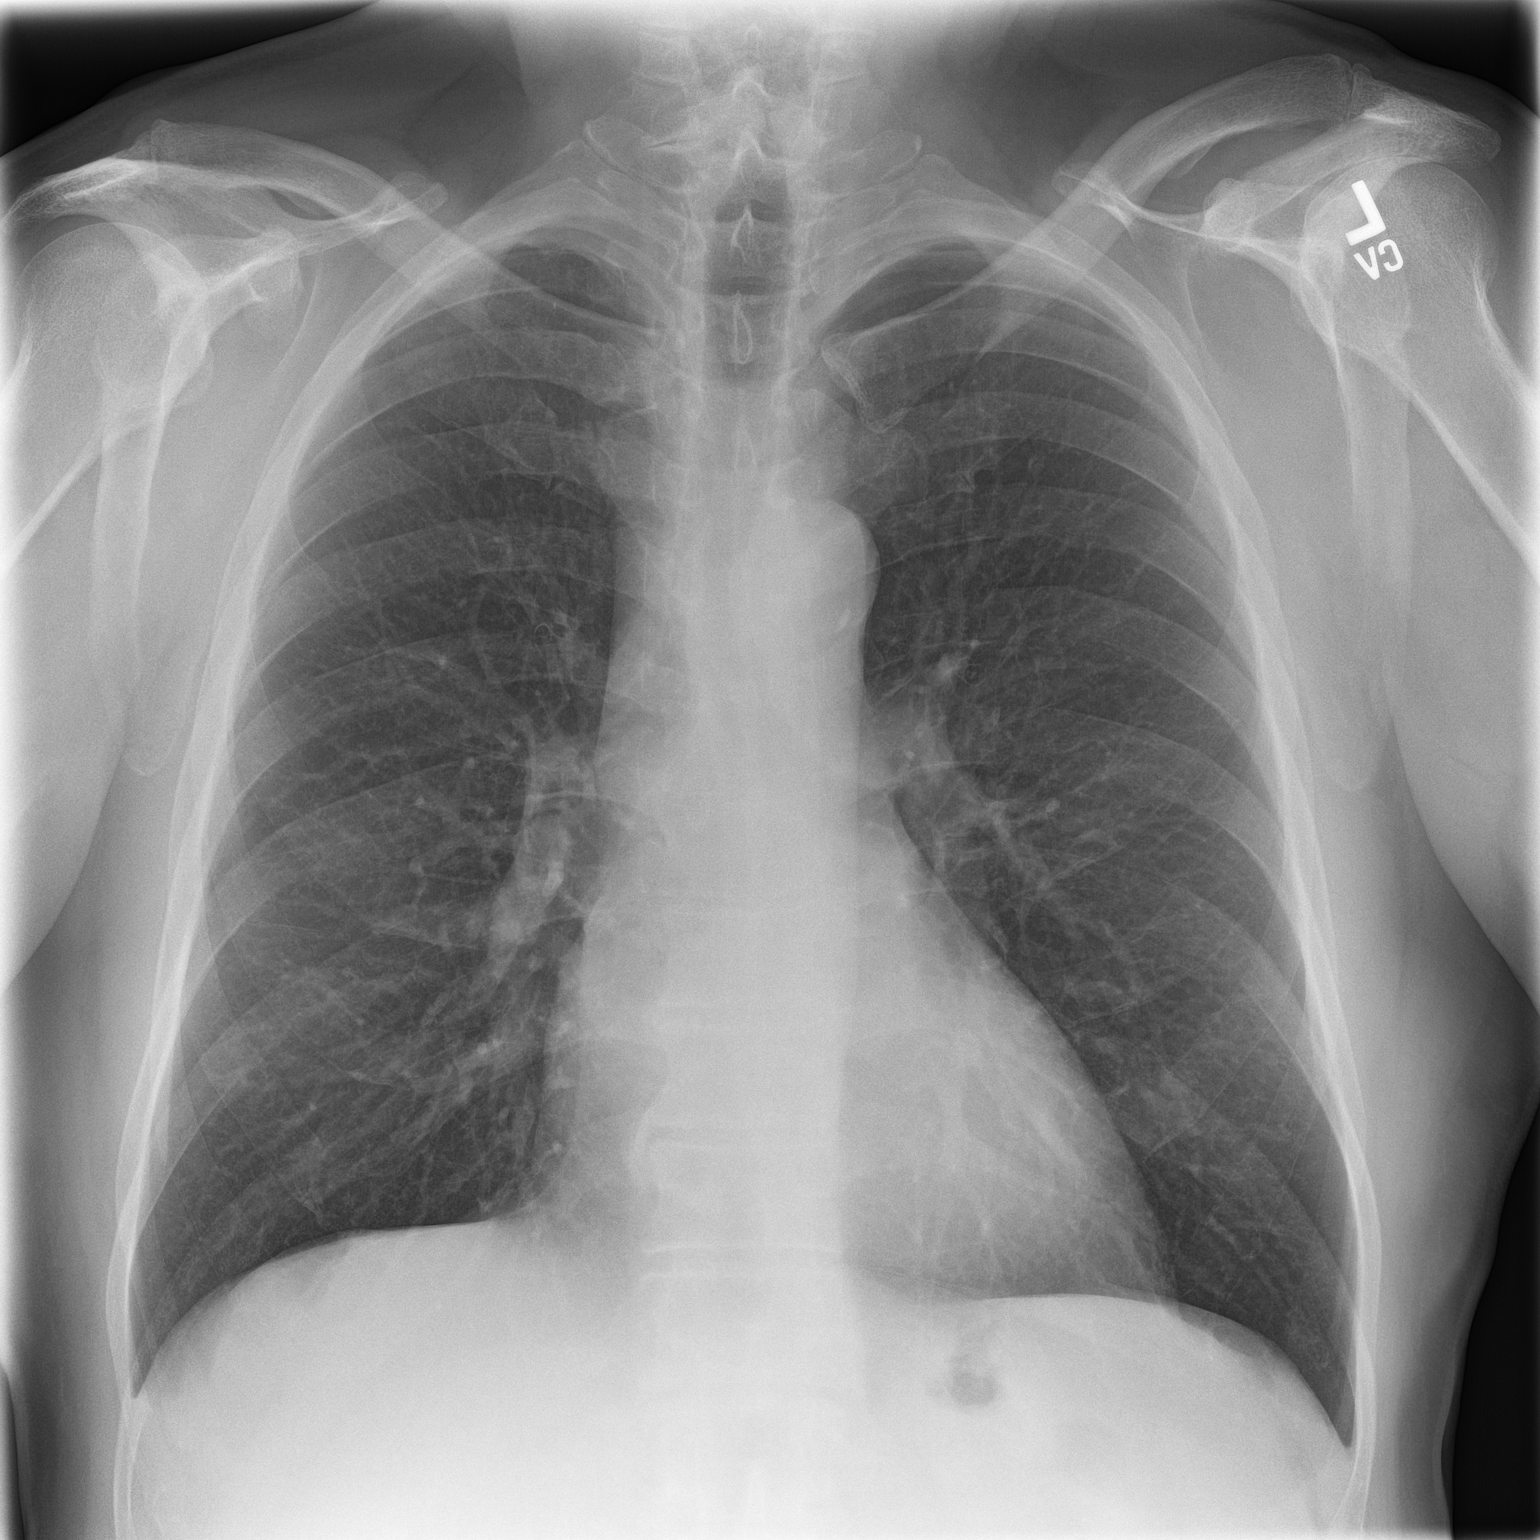

[2 of 2 positions shown; findings below may reference images not displayed]

FINDINGS: The lungs are well-expanded and clear. The heart and pulmonary
vascularity are normal. The mediastinum is normal in width. There is
no pleural effusion or pneumothorax. The bony thorax is
unremarkable.
IMPRESSION: There is no active cardiopulmonary disease.

## 2016-05-08 DIAGNOSIS — L570 Actinic keratosis: Secondary | ICD-10-CM | POA: Diagnosis not present

## 2016-05-08 DIAGNOSIS — L821 Other seborrheic keratosis: Secondary | ICD-10-CM | POA: Diagnosis not present

## 2016-05-08 DIAGNOSIS — L565 Disseminated superficial actinic porokeratosis (DSAP): Secondary | ICD-10-CM | POA: Diagnosis not present

## 2016-05-08 DIAGNOSIS — Z85828 Personal history of other malignant neoplasm of skin: Secondary | ICD-10-CM | POA: Diagnosis not present

## 2016-05-08 DIAGNOSIS — X32XXXA Exposure to sunlight, initial encounter: Secondary | ICD-10-CM | POA: Diagnosis not present

## 2016-06-10 DIAGNOSIS — K7689 Other specified diseases of liver: Secondary | ICD-10-CM | POA: Diagnosis not present

## 2016-06-10 DIAGNOSIS — Z85038 Personal history of other malignant neoplasm of large intestine: Secondary | ICD-10-CM | POA: Diagnosis not present

## 2016-06-10 DIAGNOSIS — I5022 Chronic systolic (congestive) heart failure: Secondary | ICD-10-CM | POA: Diagnosis not present

## 2016-06-11 ENCOUNTER — Other Ambulatory Visit: Payer: Self-pay | Admitting: Family Medicine

## 2016-06-28 DIAGNOSIS — R932 Abnormal findings on diagnostic imaging of liver and biliary tract: Secondary | ICD-10-CM | POA: Diagnosis not present

## 2016-06-28 DIAGNOSIS — K7689 Other specified diseases of liver: Secondary | ICD-10-CM | POA: Diagnosis not present

## 2016-06-28 DIAGNOSIS — N281 Cyst of kidney, acquired: Secondary | ICD-10-CM | POA: Diagnosis not present

## 2016-07-29 DIAGNOSIS — I10 Essential (primary) hypertension: Secondary | ICD-10-CM | POA: Diagnosis not present

## 2016-07-29 DIAGNOSIS — R9431 Abnormal electrocardiogram [ECG] [EKG]: Secondary | ICD-10-CM | POA: Diagnosis not present

## 2016-07-29 DIAGNOSIS — E782 Mixed hyperlipidemia: Secondary | ICD-10-CM | POA: Diagnosis not present

## 2016-07-29 DIAGNOSIS — I5022 Chronic systolic (congestive) heart failure: Secondary | ICD-10-CM | POA: Diagnosis not present

## 2016-07-29 DIAGNOSIS — E875 Hyperkalemia: Secondary | ICD-10-CM | POA: Diagnosis not present

## 2016-08-30 ENCOUNTER — Ambulatory Visit (INDEPENDENT_AMBULATORY_CARE_PROVIDER_SITE_OTHER): Payer: PPO | Admitting: Family Medicine

## 2016-08-30 ENCOUNTER — Encounter: Payer: Self-pay | Admitting: Family Medicine

## 2016-08-30 VITALS — BP 130/74 | HR 72 | Temp 98.4°F | Resp 16 | Wt 226.0 lb

## 2016-08-30 DIAGNOSIS — S46911A Strain of unspecified muscle, fascia and tendon at shoulder and upper arm level, right arm, initial encounter: Secondary | ICD-10-CM

## 2016-08-30 NOTE — Progress Notes (Signed)
       Patient: Derek Jackson Male    DOB: 09-13-1950   66 y.o.   MRN: [id number] Visit Date: 08/30/2016  Today's Provider: Lelon Huh, MD   Chief Complaint  Patient presents with  . Shoulder Pain   Subjective:    HPI Patient presents today c/o pain in his right shoulder. Patient reports that he did fall about 3 months ago at [facility]. He tripped off of a wall he was walking on and tried to catch himself with his right arm. Patient reports that the pain is constant. However, the pain can be severe depending on the way he moves his right arm. Patient reports that he has been taking Aleve for the pain which provides mild relief for a couple of hours.     No Known Allergies   Current Outpatient Prescriptions:  .  lisinopril (PRINIVIL,ZESTRIL) 20 MG tablet, Take 1 tablet by mouth daily., Disp: , Rfl:  .  metoprolol tartrate (LOPRESSOR) 25 MG tablet, Take 1 tablet by mouth 2 (two) times daily., Disp: , Rfl:  .  ranitidine (ZANTAC) 300 MG tablet, TAKE 1 TABLET BY MOUTH TWICE A DAY, Disp: 180 tablet, Rfl: 4 .  spironolactone (ALDACTONE) 25 MG tablet, Take 25 mg by mouth daily., Disp: , Rfl:   Review of Systems  Constitutional: Positive for activity change.  Musculoskeletal: Positive for arthralgias and joint swelling.  Neurological: Negative for weakness and numbness.    Social History  Substance Use Topics  . Smoking status: Former Smoker    Packs/day: 1.00    Years: 8.00    Types: Cigarettes    Quit date: 04/26/1990  . Smokeless tobacco: Never Used  . Alcohol use No   Objective:   BP 130/74 (BP Location: Left Arm, Patient Position: Sitting, Cuff Size: Large)   Pulse 72   Temp 98.4 F (36.9 C)   Resp 16   Wt 226 lb (102.5 kg)   BMI 29.41 kg/m   Physical Exam  General appearance: alert, well developed, well nourished, cooperative and in no distress Head: Normocephalic, without obvious abnormality, atraumatic Respiratory: Respirations even and unlabored, normal  respiratory rate Extremities: Pain with active and passive right shoulder rotation. Pain with biceps flexion. Tender at proximal aspect of biceps.  Skin: Skin color, texture, turgor normal. No rashes seen  Psych: Appropriate mood and affect. Neurologic: Mental status: Alert, oriented to person, place, and time, thought content appropriate.     Assessment & Plan:     1. Strain of right shoulder, initial encounter Suspect proximal biceps strain or tear. Not improving 3 months after injury on OTC NSAIDs.  - Ambulatory referral to [place], MD  [facility]

## 2016-09-04 DIAGNOSIS — M7541 Impingement syndrome of right shoulder: Secondary | ICD-10-CM | POA: Diagnosis not present

## 2016-09-30 DIAGNOSIS — M25611 Stiffness of right shoulder, not elsewhere classified: Secondary | ICD-10-CM | POA: Diagnosis not present

## 2016-09-30 DIAGNOSIS — M7541 Impingement syndrome of right shoulder: Secondary | ICD-10-CM | POA: Diagnosis not present

## 2016-09-30 DIAGNOSIS — M25511 Pain in right shoulder: Secondary | ICD-10-CM | POA: Diagnosis not present

## 2016-11-01 ENCOUNTER — Encounter: Payer: Self-pay | Admitting: Family Medicine

## 2016-11-01 ENCOUNTER — Ambulatory Visit (INDEPENDENT_AMBULATORY_CARE_PROVIDER_SITE_OTHER): Payer: PPO | Admitting: Family Medicine

## 2016-11-01 VITALS — BP 132/80 | HR 69 | Temp 98.5°F | Resp 16 | Wt 225.0 lb

## 2016-11-01 DIAGNOSIS — R3911 Hesitancy of micturition: Secondary | ICD-10-CM | POA: Diagnosis not present

## 2016-11-01 DIAGNOSIS — R300 Dysuria: Secondary | ICD-10-CM

## 2016-11-01 DIAGNOSIS — N410 Acute prostatitis: Secondary | ICD-10-CM

## 2016-11-01 LAB — POCT URINALYSIS DIPSTICK
BILIRUBIN UA: NEGATIVE
Glucose, UA: NEGATIVE
KETONES UA: NEGATIVE
LEUKOCYTES UA: NEGATIVE
Nitrite, UA: NEGATIVE
PH UA: 7
PROTEIN UA: NEGATIVE
RBC UA: NEGATIVE
SPEC GRAV UA: 1.01
Urobilinogen, UA: 0.2

## 2016-11-01 MED ORDER — TAMSULOSIN HCL 0.4 MG PO CAPS: 0.4000 mg | ORAL_CAPSULE | Freq: Every day | ORAL | 0 refills | Status: DC

## 2016-11-01 MED ORDER — CIPROFLOXACIN HCL 500 MG PO TABS: 500.0000 mg | ORAL_TABLET | Freq: Two times a day (BID) | ORAL | 1 refills | Status: AC

## 2016-11-01 NOTE — Progress Notes (Signed)
Patient: Derek Jackson Male    DOB: 12/17/1950   66 y.o.   MRN: [id number] Visit Date: 11/01/2016  Today's Provider: Lelon Huh, MD   Chief Complaint  Patient presents with  . Dysuria    x 3 days   Subjective:    Dysuria   This is a new problem. Episode onset: 3 days ago. The problem occurs every urination. The problem has been gradually worsening. The quality of the pain is described as burning. There has been no fever. Associated symptoms include chills, frequency, hesitancy and urgency. Pertinent negatives include no discharge, flank pain, hematuria, nausea, sweats or vomiting. He has tried increased fluids for the symptoms. The treatment provided no relief.       No Known Allergies   Current Outpatient Prescriptions:  .  lisinopril (PRINIVIL,ZESTRIL) 20 MG tablet, Take 1 tablet by mouth daily., Disp: , Rfl:  .  metoprolol tartrate (LOPRESSOR) 25 MG tablet, Take 1 tablet by mouth 2 (two) times daily., Disp: , Rfl:  .  ranitidine (ZANTAC) 300 MG tablet, TAKE 1 TABLET BY MOUTH TWICE A DAY, Disp: 180 tablet, Rfl: 4 .  spironolactone (ALDACTONE) 25 MG tablet, Take 25 mg by mouth daily., Disp: , Rfl:   Review of Systems  Constitutional: Positive for chills. Negative for appetite change and fever.  Respiratory: Negative for chest tightness, shortness of breath and wheezing.   Cardiovascular: Negative for chest pain and palpitations.  Gastrointestinal: Negative for abdominal pain, nausea and vomiting.  Genitourinary: Positive for difficulty urinating, dysuria, frequency, hesitancy and urgency. Negative for discharge, flank pain and hematuria.       Urinary incontinence    Social History  Substance Use Topics  . Smoking status: Former Smoker    Packs/day: 1.00    Years: 8.00    Types: Cigarettes    Quit date: 04/26/1990  . Smokeless tobacco: Never Used  . Alcohol use No   Objective:   BP 132/80 (BP Location: Left Arm, Patient Position: Sitting, Cuff Size: Large)    Pulse 69   Temp 98.5 F (36.9 C) (Oral)   Resp 16   Wt 225 lb (102.1 kg)   BMI 29.28 kg/m  Vitals:   11/01/16 1056  Resp: 16  Weight: 225 lb (102.1 kg)     Physical Exam  General appearance: alert, well developed, well nourished, cooperative and in no distress Head: Normocephalic, without obvious abnormality, atraumatic Respiratory: Respirations even and unlabored, normal respiratory rate Extremities: No gross deformities Skin: Skin color, texture, turgor normal. No rashes seen  Psych: Appropriate mood and affect. Neurologic: Mental status: Alert, oriented to person, place, and time, thought content appropriate. Results for orders placed or performed in visit on 11/01/16  POCT Urinalysis Dipstick  Result Value Ref Range   Color, UA yellow    Clarity, UA clear    Glucose, UA negative    Bilirubin, UA negative    Ketones, UA negative    Spec Grav, UA 1.010    Blood, UA negative    pH, UA 7.0    Protein, UA negative    Urobilinogen, UA 0.2    Nitrite, UA negative    Leukocytes, UA Negative Negative       Assessment & Plan:     1. Dysuria  - POCT Urinalysis Dipstick  2. Urinary hesitancy Rapid onset of symptoms suggestive of prostatitis - tamsulosin (FLOMAX) 0.4 MG CAPS capsule; Take 1 capsule (0.4 mg total) by mouth daily.  Dispense:  30 capsule; Refill: 0  3. Acute prostatitis  - ciprofloxacin (CIPRO) 500 MG tablet; Take 1 tablet (500 mg total) by mouth 2 (two) times daily.  Dispense: 30 tablet; Refill: 1  Call if symptoms change or if not rapidly improving. Call if symptom return after finishing medication.  The entirety of the information documented in the History of Present Illness, Review of Systems and Physical Exam were personally obtained by me. Portions of this information were initially documented by Meyer Cory, CMA and reviewed by me for thoroughness and accuracy.        Lelon Huh, MD  [facility]

## 2016-11-01 NOTE — Patient Instructions (Signed)
Prostatitis Prostatitis is swelling or inflammation of the prostate gland. The prostate is a walnut-sized gland that is involved in the production of semen. It is located below a man's bladder, in front of the rectum. There are four types of prostatitis:  Chronic nonbacterial prostatitis. This is the most common type of prostatitis. It may be associated with a viral infection or autoimmune disorder.  Acute bacterial prostatitis. This is the least common type of prostatitis. It starts quickly and is usually associated with a bladder infection, high fever, and shaking chills. It can occur at any age.  Chronic bacterial prostatitis. This type usually results from acute bacterial prostatitis that happens repeatedly (is recurrent) or has not been treated properly. It can occur in men of any age but is most common among middle-aged men whose prostate has begun to get larger. The symptoms are not as severe as symptoms caused by acute bacterial prostatitis.  Prostatodynia or chronic pelvic pain syndrome (CPPS). This type is also called pelvic floor disorder. It is associated with increased muscular tone in the pelvis surrounding the prostate. What are the causes? Bacterial prostatitis is caused by infection from bacteria. Chronic nonbacterial prostatitis may be caused by:  Urinary tract infections (UTIs).  Nerve damage.  A response by the body's disease-fighting system (autoimmune response).  Chemicals in the urine. The causes of the other types of prostatitis are usually not known. What are the signs or symptoms? Symptoms of this condition vary depending upon the type of prostatitis. If you have acute bacterial prostatitis, you may experience:  Urinary symptoms, such as:  Painful urination.  Burning during urination.  Frequent and sudden urges to urinate.  Inability to start urinating.  A weak or interrupted stream of urine.  Vomiting.  Nausea.  Fever.  Chills.  Inability to  empty the bladder completely.  Pain in the:  Muscles or joints.  Lower back.  Lower abdomen. If you have any of the other types of prostatitis, you may experience:  Urinary symptoms, such as:  Sudden urges to urinate.  Frequent urination.  Difficulty starting urination.  Weak urine stream.  Dribbling after urination.  Discharge from the urethra. The urethra is a tube that opens at the end of the penis.  Pain in the:  Testicles.  Penis or tip of the penis.  Rectum.  Area in front of the rectum and below the scrotum (perineum).  Problems with sexual function.  Painful ejaculation.  Bloody semen. How is this diagnosed? This condition may be diagnosed based on:  A physical and medical exam.  Your symptoms.  A urine test to check for bacteria.  An exam in which a health care provider uses a finger to feel the prostate (digital rectal exam).  A test of a sample of semen.  Blood tests.  Ultrasound.  Removal of prostate tissue to be examined under a microscope (biopsy).  Tests to check how your body handles urine (urodynamic tests).  A test to look inside your bladder or urethra (cystoscopy). How is this treated? Treatment for this condition depends on the type of prostatitis. Treatment may involve:  Medicines to relieve pain or inflammation.  Medicines to help relax your muscles.  Physical therapy.  Heat therapy.  Techniques to help you control certain body functions (biofeedback).  Relaxation exercises.  Antibiotic medicine, if your condition is caused by bacteria.  Warm water baths (sitz baths). Sitz baths help with relaxing your pelvic floor muscles, which helps to relieve pressure on the prostate. Follow   these instructions at home:  Take over-the-counter and prescription medicines only as told by your health care provider.  If you were prescribed an antibiotic, take it as told by your health care provider. Do not stop taking the  antibiotic even if you start to feel better.  If physical therapy, biofeedback, or relaxation exercises were prescribed, do exercises as instructed.  Take sitz baths as directed by your health care provider. For a sitz bath, sit in warm water that is deep enough to cover your hips and buttocks.  Keep all follow-up visits as told by your health care provider. This is important. Contact a health care provider if:  Your symptoms get worse.  You have a fever. Get help right away if:  You have chills.  You feel nauseous.  You vomit.  You feel light-headed or feel like you are going to faint.  You are unable to urinate.  You have blood or blood clots in your urine. This information is not intended to replace advice given to you by your health care provider. Make sure you discuss any questions you have with your health care provider. Document Released: 08/09/2000 Document Revised: 05/02/2016 Document Reviewed: 05/02/2016 Elsevier Interactive Patient Education  2017 Elsevier Inc[employer]  

## 2016-12-18 ENCOUNTER — Ambulatory Visit (INDEPENDENT_AMBULATORY_CARE_PROVIDER_SITE_OTHER): Payer: PPO

## 2016-12-18 VITALS — BP 142/62 | HR 64 | Temp 99.1°F | Ht 74.0 in | Wt 224.4 lb

## 2016-12-18 DIAGNOSIS — Z0000 Encounter for general adult medical examination without abnormal findings: Secondary | ICD-10-CM

## 2016-12-18 NOTE — Patient Instructions (Signed)
[name] , Thank you for taking time to come for your Medicare Wellness Visit. I appreciate your ongoing commitment to your health goals. Please review the following plan we discussed and let me know if I can assist you in the future.   Screening recommendations/referrals: Colonoscopy: completed 02/13/12, due 01/2017 Recommended yearly ophthalmology/optometry visit for glaucoma screening and checkup Recommended yearly dental visit for hygiene and checkup  Vaccinations: Influenza vaccine: up to date, due 04/2017 Pneumococcal vaccine: up to date on Prevnar 13 (12/18/15), Pneumovax 23 due now Tdap vaccine: declined Shingles vaccine: completed on 10/22/10    Advanced directives: Please bring a copy of your POA (Power of [place]) and/or Living Will to your next appointment.   Conditions/risks identified: Increase water intake. Recommend increasing water intake to 4 glasses a day.  Next appointment: 12/23/16 @ 9 AM  Preventive Care 67 Years and Older, Male Preventive care refers to lifestyle choices and visits with your health care provider that can promote health and wellness. What does preventive care include?  A yearly physical exam. This is also called an annual well check.  Dental exams once or twice a year.  Routine eye exams. Ask your health care provider how often you should have your eyes checked.  Personal lifestyle choices, including:  Daily care of your teeth and gums.  Regular physical activity.  Eating a healthy diet.  Avoiding tobacco and drug use.  Limiting alcohol use.  Practicing safe sex.  Taking low doses of aspirin every day.  Taking vitamin and mineral supplements as recommended by your health care provider. What happens during an annual well check? The services and screenings done by your health care provider during your annual well check will depend on your age, overall health, lifestyle risk factors, and family history of disease. Counseling  Your  health care provider may ask you questions about your:  Alcohol use.  Tobacco use.  Drug use.  Emotional well-being.  Home and relationship well-being.  Sexual activity.  Eating habits.  History of falls.  Memory and ability to understand (cognition).  Work and work Statistician. Screening  You may have the following tests or measurements:  Height, weight, and BMI.  Blood pressure.  Lipid and cholesterol levels. These may be checked every 5 years, or more frequently if you are over 46 years old.  Skin check.  Lung cancer screening. You may have this screening every year starting at age 80 if you have a 30-pack-year history of smoking and currently smoke or have quit within the past 15 years.  Fecal occult blood test (FOBT) of the stool. You may have this test every year starting at age 74.  Flexible sigmoidoscopy or colonoscopy. You may have a sigmoidoscopy every 5 years or a colonoscopy every 10 years starting at age 67.  Prostate cancer screening. Recommendations will vary depending on your family history and other risks.  Hepatitis C blood test.  Hepatitis B blood test.  Sexually transmitted disease (STD) testing.  Diabetes screening. This is done by checking your blood sugar (glucose) after you have not eaten for a while (fasting). You may have this done every 1-3 years.  Abdominal aortic aneurysm (AAA) screening. You may need this if you are a current or former smoker.  Osteoporosis. You may be screened starting at age 16 if you are at high risk. Talk with your health care provider about your test results, treatment options, and if necessary, the need for more tests. Vaccines  Your health care provider  may recommend certain vaccines, such as:  Influenza vaccine. This is recommended every year.  Tetanus, diphtheria, and acellular pertussis (Tdap, Td) vaccine. You may need a Td booster every 10 years.  Zoster vaccine. You may need this after age  67.  Pneumococcal 13-valent conjugate (PCV13) vaccine. One dose is recommended after age 67.  Pneumococcal polysaccharide (PPSV23) vaccine. One dose is recommended after age 67. Talk to your health care provider about which screenings and vaccines you need and how often you need them. This information is not intended to replace advice given to you by your health care provider. Make sure you discuss any questions you have with your health care provider. Document Released: 09/08/2015 Document Revised: 05/01/2016 Document Reviewed: 06/13/2015 Elsevier Interactive Patient Education  2017 [employer] Prevention in the Home Falls can cause injuries. They can happen to people of all ages. There are many things you can do to make your home safe and to help prevent falls. What can I do on the outside of my home?  Regularly fix the edges of walkways and driveways and fix any cracks.  Remove anything that might make you trip as you walk through a door, such as a raised step or threshold.  Trim any bushes or trees on the path to your home.  Use bright outdoor lighting.  Clear any walking paths of anything that might make someone trip, such as rocks or tools.  Regularly check to see if handrails are loose or broken. Make sure that both sides of any steps have handrails.  Any raised decks and porches should have guardrails on the edges.  Have any leaves, snow, or ice cleared regularly.  Use sand or salt on walking paths during winter.  Clean up any spills in your garage right away. This includes oil or grease spills. What can I do in the bathroom?  Use night lights.  Install grab bars by the toilet and in the tub and shower. Do not use towel bars as grab bars.  Use non-skid mats or decals in the tub or shower.  If you need to sit down in the shower, use a plastic, non-slip stool.  Keep the floor dry. Clean up any water that spills on the floor as soon as it happens.  Remove  soap buildup in the tub or shower regularly.  Attach bath mats securely with double-sided non-slip rug tape.  Do not have throw rugs and other things on the floor that can make you trip. What can I do in the bedroom?  Use night lights.  Make sure that you have a light by your bed that is easy to reach.  Do not use any sheets or blankets that are too big for your bed. They should not hang down onto the floor.  Have a firm chair that has side arms. You can use this for support while you get dressed.  Do not have throw rugs and other things on the floor that can make you trip. What can I do in the kitchen?  Clean up any spills right away.  Avoid walking on wet floors.  Keep items that you use a lot in easy-to-reach places.  If you need to reach something above you, use a strong step stool that has a grab bar.  Keep electrical cords out of the way.  Do not use floor polish or wax that makes floors slippery. If you must use wax, use non-skid floor wax.  Do not have throw rugs  and other things on the floor that can make you trip. What can I do with my stairs?  Do not leave any items on the stairs.  Make sure that there are handrails on both sides of the stairs and use them. Fix handrails that are broken or loose. Make sure that handrails are as long as the stairways.  Check any carpeting to make sure that it is firmly attached to the stairs. Fix any carpet that is loose or worn.  Avoid having throw rugs at the top or bottom of the stairs. If you do have throw rugs, attach them to the floor with carpet tape.  Make sure that you have a light switch at the top of the stairs and the bottom of the stairs. If you do not have them, ask someone to add them for you. What else can I do to help prevent falls?  Wear shoes that:  Do not have high heels.  Have rubber bottoms.  Are comfortable and fit you well.  Are closed at the toe. Do not wear sandals.  If you use a  stepladder:  Make sure that it is fully opened. Do not climb a closed stepladder.  Make sure that both sides of the stepladder are locked into place.  Ask someone to hold it for you, if possible.  Clearly mark and make sure that you can see:  Any grab bars or handrails.  First and last steps.  Where the edge of each step is.  Use tools that help you move around (mobility aids) if they are needed. These include:  Canes.  Walkers.  Scooters.  Crutches.  Turn on the lights when you go into a dark area. Replace any light bulbs as soon as they burn out.  Set up your furniture so you have a clear path. Avoid moving your furniture around.  If any of your floors are uneven, fix them.  If there are any pets around you, be aware of where they are.  Review your medicines with your doctor. Some medicines can make you feel dizzy. This can increase your chance of falling. Ask your doctor what other things that you can do to help prevent falls. This information is not intended to replace advice given to you by your health care provider. Make sure you discuss any questions you have with your health care provider. Document Released: 06/08/2009 Document Revised: 01/18/2016 Document Reviewed: 09/16/2014 Elsevier Interactive Patient Education  2017 [employer].

## 2016-12-18 NOTE — Progress Notes (Signed)
Subjective:   Derek Jackson is a 66 y.o. male who presents for an Initial Medicare Annual Wellness Visit.  Review of Systems  N/A  Cardiac Risk Factors include: advanced age (>34men, >90 women);dyslipidemia;hypertension;male gender    Objective:    Today's Vitals   12/18/16 1305 12/18/16 1312  BP: (!) 142/62   Pulse: 64   Temp: 99.1 F (37.3 C)   TempSrc: Oral   Weight: 224 lb 6.4 oz (101.8 kg)   Height: 6\' 2"  (1.88 m)   PainSc: 0-No pain 0-No pain   Body mass index is 28.81 kg/m.  Current Medications (verified) Outpatient Encounter Prescriptions as of 12/18/2016  Medication Sig  . lisinopril (PRINIVIL,ZESTRIL) 20 MG tablet Take 1 tablet by mouth daily.  . metoprolol tartrate (LOPRESSOR) 25 MG tablet Take 1 tablet by mouth 2 (two) times daily.  . Multiple Vitamin (MULTIVITAMIN) capsule Take 1 capsule by mouth daily.  . ranitidine (ZANTAC) 300 MG tablet TAKE 1 TABLET BY MOUTH TWICE A DAY  . spironolactone (ALDACTONE) 25 MG tablet Take 12.5 mg by mouth daily.    No facility-administered encounter medications on file as of 12/18/2016.     Allergies (verified) Patient has no known allergies.   History: Past Medical History:  Diagnosis Date  . BPH (benign prostatic hypertrophy)   . Esophagitis, reflux   . Kidney stone   . Rosacea   . Stroke ([place])   . Systolic CHF [facility])    Past Surgical History:  Procedure Laterality Date  . Abdominal wall mass excision  2010   Dr. Bary Castilla  . CARDIAC CATHETERIZATION  07/2014   No blockages. per patient one side of heart was not functioning well, CHF  . CHOLECYSTECTOMY  2005   Lapraroscopic  . COLONOSCOPY  2010  . CT Scan of Abdomen  12/05/2008   Soft Tissue mass 2.42cm x 3.98 cm of Abdominal wall lateral to umbilicus, pathology shows fibromatosis. Multiple large liver cysts up to 9cm x 9cm  . KNEE SURGERY  1994   Arthroscopy. Surgery by Dr. Marry Guan  . RIGHT COLECTOMY  2006   Dr.Byrnett  . TONSILLECTOMY AND ADENOIDECTOMY   1960's  . UPPER GI ENDOSCOPY  2010   Family History  Problem Relation Age of Onset  . Colon polyps Father   . Diabetes Father   . CAD Father     had CABG  . Stroke Father   . Dementia Father   . Congestive Heart Failure Mother   . Heart attack Maternal Grandfather   . Diabetes Paternal Grandmother   . Heart attack Paternal Grandfather   . Heart disease Brother     also had history of VSD  . Aneurysm Brother    Social History   Occupational History  . Employed     Works at [employer]  . Smoking status: Former Smoker    Packs/day: 1.00    Years: 8.00    Types: Cigarettes    Quit date: 04/26/1990  . Smokeless tobacco: Never Used  . Alcohol use No  . Drug use: No  . Sexual activity: Not on file   Tobacco Counseling Counseling given: Not Answered   Activities of Daily Living In your present state of health, do you have any difficulty performing the following activities: 12/18/2016  Hearing? N  Vision? N  Difficulty concentrating or making decisions? N  Walking or climbing stairs? N  Dressing or bathing? N  Doing errands, shopping? N  Preparing Food and eating ? N  Using the Toilet? N  In the past six months, have you accidently leaked urine? N  Do you have problems with loss of bowel control? N  Managing your Medications? N  Managing your Finances? N  Housekeeping or managing your Housekeeping? N  Some recent data might be hidden    Immunizations and Health Maintenance Immunization History  Administered Date(s) Administered  . Pneumococcal Conjugate-13 12/18/2015  . Td 03/21/1999, 11/25/2003  . Zoster 10/22/2010   Health Maintenance Due  Topic Date Due  . PNA vac Low Risk Adult (2 of 2 - PPSV23) 12/17/2016    Patient Care Team: Birdie Sons, MD as PCP - General (Family Medicine) Corey Skains, MD as Consulting Physician (Internal Medicine) Robert Bellow, MD (General Surgery)  Indicate any recent  Medical Services you may have received from other than [facility] providers in the past year (date may be approximate).    Assessment:   This is a routine wellness examination for Derek Jackson.   Hearing/Vision screen Vision Screening Comments: Pt [facility] checks yearly.   Dietary issues and exercise activities discussed: Current Exercise Habits: Home exercise routine, Type of exercise: walking, Time (Minutes): 35, Frequency (Times/Week): 2, Weekly Exercise (Minutes/Week): 70, Intensity: Mild  Goals    . Increase water intake          Recommend increasing water intake to 4 glasses a day.      Depression Screen PHQ 2/9 Scores 12/18/2016 12/18/2015  PHQ - 2 Score 5 2  PHQ- 9 Score 8 4    Fall Risk Fall Risk  12/18/2016 12/18/2015  Falls in the past year? No No    Cognitive Function:     6CIT Screen 12/18/2016  What Year? 0 points  What month? 0 points  What time? 0 points  Count back from 20 0 points  Months in reverse 0 points  Repeat phrase 4 points  Total Score 4    Screening Tests Health Maintenance  Topic Date Due  . PNA vac Low Risk Adult (2 of 2 - PPSV23) 12/17/2016  . TETANUS/TDAP  12/22/2016 (Originally 11/24/2013)  . COLONOSCOPY  02/12/2017  . INFLUENZA VACCINE  03/26/2017  . Hepatitis C Screening  Completed        Plan:  I have personally reviewed and addressed the Medicare Annual Wellness questionnaire and have noted the following in the patient's chart:  A. Medical and social history B. Use of alcohol, tobacco or illicit drugs  C. Current medications and supplements D. Functional ability and status E.  Nutritional status F.  Physical activity G. Advance directives H. List of other physicians I.  Hospitalizations, surgeries, and ER visits in previous 12 months J.  [place] such as hearing and vision if needed, cognitive and depression L. Referrals and appointments - none  In addition, I have reviewed and discussed  with patient certain preventive protocols, quality metrics, and best practice recommendations. A written personalized care plan for preventive services as well as general preventive health recommendations were provided to patient.  See attached scanned questionnaire for additional information.   Signed,  Fabio Neighbors, LPN Nurse Health Advisor   MD Recommendations: Pt to check with insurance on coverage for tdap and will decide by next appointment on 12/23/16 if he will receive this. Pt is also waiting until 12/23/16 for Pneumovax 23 vaccine. Need to follow up on depression screening, failed today.  .I have reviewed the  health advisor's note, was available for consultation, and agree with documentation and plan  Lelon Huh, MD

## 2016-12-23 ENCOUNTER — Encounter: Payer: Self-pay | Admitting: Family Medicine

## 2016-12-23 ENCOUNTER — Ambulatory Visit (INDEPENDENT_AMBULATORY_CARE_PROVIDER_SITE_OTHER): Payer: PPO | Admitting: Family Medicine

## 2016-12-23 VITALS — BP 108/70 | HR 50 | Temp 98.0°F | Resp 16 | Ht 73.0 in | Wt 220.0 lb

## 2016-12-23 DIAGNOSIS — Z8249 Family history of ischemic heart disease and other diseases of the circulatory system: Secondary | ICD-10-CM

## 2016-12-23 DIAGNOSIS — R5383 Other fatigue: Secondary | ICD-10-CM

## 2016-12-23 DIAGNOSIS — I10 Essential (primary) hypertension: Secondary | ICD-10-CM

## 2016-12-23 DIAGNOSIS — R3911 Hesitancy of micturition: Secondary | ICD-10-CM

## 2016-12-23 DIAGNOSIS — R351 Nocturia: Secondary | ICD-10-CM | POA: Diagnosis not present

## 2016-12-23 DIAGNOSIS — N401 Enlarged prostate with lower urinary tract symptoms: Secondary | ICD-10-CM

## 2016-12-23 DIAGNOSIS — Z0000 Encounter for general adult medical examination without abnormal findings: Secondary | ICD-10-CM

## 2016-12-23 DIAGNOSIS — E782 Mixed hyperlipidemia: Secondary | ICD-10-CM | POA: Diagnosis not present

## 2016-12-23 DIAGNOSIS — I5022 Chronic systolic (congestive) heart failure: Secondary | ICD-10-CM | POA: Diagnosis not present

## 2016-12-23 DIAGNOSIS — Z23 Encounter for immunization: Secondary | ICD-10-CM | POA: Diagnosis not present

## 2016-12-23 DIAGNOSIS — Z125 Encounter for screening for malignant neoplasm of prostate: Secondary | ICD-10-CM | POA: Diagnosis not present

## 2016-12-23 MED ORDER — TAMSULOSIN HCL 0.4 MG PO CAPS: 0.4000 mg | ORAL_CAPSULE | Freq: Every day | ORAL | 11 refills | Status: DC

## 2016-12-23 NOTE — Progress Notes (Signed)
Patient: Derek Jackson, Male    DOB: [birth year], 66 y.o.   MRN: [id number] Visit Date: 12/23/2016  Today's Provider: Lelon Huh, MD   Chief Complaint  Patient presents with  . Annual Exam  . Congestive Heart Failure    follow up  . Gastroesophageal Reflux    follow up   Subjective:    Annual physical exam Derek Jackson is a 66 y.o. male who presents today for health maintenance and complete physical. He feels fairly well. He reports never exercising. He reports he is sleeping fairly well.  ----------------------------------------------------------------- Follow up of CHF:  Patient was last seen for this problem 1 year ago. Labs were checked which showed elevated Potassium levels. Patient was advised to avoid Potassium rich foods, and to discuss medications with Cardiology.   Follow up of GERD:  Patient was last seen for this problem 1 year ago and no changes were made. Patient reports good compliance with treatment, good tolerance and good symptom control.   Follow up prostatitis.  He was treated for prostatitis about six weeks ago with cipro and tamsulosin. He states that tamsulosin made a world of difference. He was sleeping through the night without having to get up to void and found it much easier to begin urination and empty his bladder more completely. Since finishing medication he has been getting up at night again to urinate, sometimes 2-3 times  He also report very little motivation or interest the last month or two. He scored 8 on PHQ-0 at his recent AWV. He states he he had some issues with one of his children a few months ago that he thinks set off some depression, and that hasn't been as physically active due to weather. He states he thinks he will snap out of it soon.   Review of Systems  Constitutional: Positive for diaphoresis. Negative for appetite change, chills, fatigue and fever.  HENT: Negative for congestion, ear pain, hearing loss, nosebleeds  and trouble swallowing.   Eyes: Negative for pain and visual disturbance.  Respiratory: Negative for cough, chest tightness and shortness of breath.   Cardiovascular: Negative for chest pain, palpitations and leg swelling.  Gastrointestinal: Negative for abdominal pain, blood in stool, constipation, diarrhea, nausea and vomiting.  Endocrine: Positive for polyuria. Negative for polydipsia and polyphagia.  Genitourinary: Positive for frequency. Negative for dysuria and flank pain.  Musculoskeletal: Negative for arthralgias, back pain, joint swelling, myalgias and neck stiffness.  Skin: Negative for color change, rash and wound.  Neurological: Negative for dizziness, tremors, seizures, speech difficulty, weakness, light-headedness and headaches.  Psychiatric/Behavioral: Positive for dysphoric mood. Negative for behavioral problems, confusion, decreased concentration and sleep disturbance. The patient is not nervous/anxious.   All other systems reviewed and are negative.   Social History      He  reports that he quit smoking about 26 years ago. His smoking use included Cigarettes. He has a 8.00 pack-year smoking history. He has never used smokeless tobacco. He reports that he does not drink alcohol or use drugs.       Social History   Social History  . Marital status: Single    Spouse name: N/A  . Number of children: 3  . Years of education: N/A   Occupational History  . Employed     Works at [employer]  . Smoking status: Former Smoker    Packs/day: 1.00    Years: 8.00  Types: Cigarettes    Quit date: 04/26/1990  . Smokeless tobacco: Never Used  . Alcohol use No  . Drug use: No  . Sexual activity: Not Asked   Other Topics Concern  . None   Social History Narrative   Retired April 2017, still working 3 days a week    Past Medical History:  Diagnosis Date  . BPH (benign prostatic hypertrophy)   . Esophagitis, reflux   . Kidney  stone   . Rosacea   . Stroke ([place])   . Systolic CHF [facility])      Patient Active Problem List   Diagnosis Date Noted  . BPH (benign prostatic hypertrophy) 04/27/2015  . Chest pain 04/27/2015  . Dyspnea on exertion 04/27/2015  . Fatigue 04/27/2015  . History of colon cancer 04/27/2015  . History of shingles 04/27/2015  . History of kidney stones 04/27/2015  . Systolic CHF ([place]) 39/10/90  . Tinnitus 04/27/2015  . Benign essential hypertension 12/12/2014  . Hyperlipemia, mixed 07/25/2014  . Stricture and stenosis of esophagus 05/10/2009  . Fam hx-ischem heart disease 07/13/2008  . Dermatophytosis, nail 05/03/2007  . Adjustment disorder with mixed anxiety and depressed mood 09/09/2006  . Esophagitis, reflux 06/20/2006  . Rosacea 06/20/2006    Past Surgical History:  Procedure Laterality Date  . Abdominal wall mass excision  2010   Dr. Bary Castilla  . CARDIAC CATHETERIZATION  07/2014   No blockages. per patient one side of heart was not functioning well, CHF  . CHOLECYSTECTOMY  2005   Lapraroscopic  . COLONOSCOPY  2010  . CT Scan of Abdomen  12/05/2008   Soft Tissue mass 2.42cm x 3.98 cm of Abdominal wall lateral to umbilicus, pathology shows fibromatosis. Multiple large liver cysts up to 9cm x 9cm  . KNEE SURGERY  1994   Arthroscopy. Surgery by Dr. Marry Guan  . RIGHT COLECTOMY  2006   Dr.Byrnett  . TONSILLECTOMY AND ADENOIDECTOMY  1960's  . UPPER GI ENDOSCOPY  2010    Family History        Family Status  Relation Status  . Father Deceased at age 43   Cause of death: CVA  . Mother Deceased at age 16   Cause of Death: CHF. was also prediabetic  . Maternal Grandfather Deceased   MI  . Paternal Grandmother Deceased   Cause of death: Complications of Diabetes  . Paternal Grandfather Deceased   MI  . Brother Deceased at age 50   Cause of Death: Heart Disease  . Sister Alive  . Brother Alive   PRE- Diabetes  . Daughter Alive   has had surgery for VSD  . Maternal  Grandmother Deceased   OLD age        His family history includes Aneurysm in his brother; CAD in his father; Colon polyps in his father; Congestive Heart Failure in his mother; Dementia in his father; Diabetes in his father and paternal grandmother; Heart attack in his maternal grandfather and paternal grandfather; Heart disease in his brother; Stroke in his father.     No Known Allergies   Current Outpatient Prescriptions:  .  lisinopril (PRINIVIL,ZESTRIL) 20 MG tablet, Take 1 tablet by mouth daily., Disp: , Rfl:  .  metoprolol tartrate (LOPRESSOR) 25 MG tablet, Take 1 tablet by mouth 2 (two) times daily., Disp: , Rfl:  .  Multiple Vitamin (MULTIVITAMIN) capsule, Take 1 capsule by mouth daily., Disp: , Rfl:  .  ranitidine (ZANTAC) 300 MG tablet, TAKE 1 TABLET BY MOUTH TWICE  A DAY, Disp: 180 tablet, Rfl: 4 .  spironolactone (ALDACTONE) 25 MG tablet, Take 12.5 mg by mouth daily. , Disp: , Rfl:    Patient Care Team: Birdie Sons, MD as PCP - General (Family Medicine) Corey Skains, MD as Consulting Physician (Internal Medicine) Robert Bellow, MD (General Surgery)      Objective:   Vitals: BP 108/70 (BP Location: Right Arm, Patient Position: Sitting, Cuff Size: Large)   Pulse (!) 50   Temp 98 F (36.7 C) (Oral)   Resp 16   Ht 6\' 1"  (1.854 m)   Wt 220 lb (99.8 kg)   SpO2 98% Comment: room air  BMI 29.03 kg/m   There were no vitals filed for this visit.   Physical Exam   General Appearance:    Alert, cooperative, no distress, appears stated age  Head:    Normocephalic, without obvious abnormality, atraumatic  Eyes:    PERRL, conjunctiva/corneas clear, EOM's intact, fundi    benign, both eyes       Ears:    Normal TM's and external ear canals, both ears  Nose:   Nares normal, septum midline, mucosa normal, no drainage   or sinus tenderness  Throat:   Lips, mucosa, and tongue normal; teeth and gums normal  Neck:   Supple, symmetrical, trachea midline, no adenopathy;        thyroid:  No enlargement/tenderness/nodules; no carotid   bruit or JVD  Back:     Symmetric, no curvature, ROM normal, no CVA tenderness  Lungs:     Clear to auscultation bilaterally, respirations unlabored  Chest wall:    No tenderness or deformity  Heart:    Regular rate and rhythm, S1 and S2 normal, no murmur, rub   or gallop  Abdomen:     Soft, non-tender, bowel sounds active all four quadrants,    no masses, no organomegaly  Genitalia:    deferred  Rectal:    deferred  Extremities:   Extremities normal, atraumatic, no cyanosis or edema  Pulses:   2+ and symmetric all extremities  Skin:   Skin color, texture, turgor normal, no rashes or lesions  Lymph nodes:   Cervical, supraclavicular, and axillary nodes normal  Neurologic:   CNII-XII intact. Normal strength, sensation and reflexes      throughout    Depression Screen PHQ 2/9 Scores 12/18/2016 12/18/2015  PHQ - 2 Score 5 2  PHQ- 9 Score 8 4      Assessment & Plan:     Routine Health Maintenance and Physical Exam  Exercise Activities and Dietary recommendations Goals    . Increase water intake          Recommend increasing water intake to 4 glasses a day.       Immunization History  Administered Date(s) Administered  . Pneumococcal Conjugate-13 12/18/2015  . Td 03/21/1999, 11/25/2003  . Zoster 10/22/2010    Health Maintenance  Topic Date Due  . TETANUS/TDAP  11/24/2013  . PNA vac Low Risk Adult (2 of 2 - PPSV23) 12/17/2016  . COLONOSCOPY  02/12/2017  . INFLUENZA VACCINE  03/26/2017  . Hepatitis C Screening  Completed     Discussed health benefits of physical activity, and encouraged him to engage in regular exercise appropriate for his age and condition.    --------------------------------------------------------------------  1. Annual physical exam   2. Chronic systolic congestive heart failure (HCC) Stable. Has follow up with cardiology in June  3. Hyperlipemia, mixed Not currently on  statin.  - Lipid panel  4. Fam hx-ischem heart disease   5. Benign essential hypertension Well controlled.  Continue current medications.    6. Urinary hesitancy Did much better while taking tamsulosin in march.  - tamsulosin (FLOMAX) 0.4 MG CAPS capsule; Take 1 capsule (0.4 mg total) by mouth daily.  Dispense: 30 capsule; Refill: 11  7. Other fatigue He likely has some mild depression which he feels is transient and expects to improve with seasonal change. Consider SSRI if not improving in 102 months.  - Comprehensive metabolic panel - CBC - T4 AND TSH - Testosterone,Free and Total  8. Benign prostatic hyperplasia with nocturia Restart alpha blocker as above.   10. Need for pneumococcal vaccination  - Pneumococcal polysaccharide vaccine 23-valent greater than or equal to 2yo subcutaneous/IM  11. Prostate cancer screening PSA  The entirety of the information documented in the History of Present Illness, Review of Systems and Physical Exam were personally obtained by me. Portions of this information were initially documented by Meyer Cory, CMA and reviewed by me for thoroughness and accuracy.    Lelon Huh, MD  [facility]

## 2016-12-24 ENCOUNTER — Telehealth: Payer: Self-pay

## 2016-12-24 ENCOUNTER — Other Ambulatory Visit: Payer: Self-pay | Admitting: Family Medicine

## 2016-12-24 DIAGNOSIS — R972 Elevated prostate specific antigen [PSA]: Secondary | ICD-10-CM

## 2016-12-24 LAB — PSA: Prostate Specific Ag, Serum: 4.2 ng/mL — ABNORMAL HIGH (ref 0.0–4.0)

## 2016-12-24 NOTE — Telephone Encounter (Signed)
Opened in error.  Thanks,  -

## 2016-12-25 LAB — COMPREHENSIVE METABOLIC PANEL
A/G RATIO: 2.4 — AB (ref 1.2–2.2)
ALT: 33 IU/L (ref 0–44)
AST: 28 IU/L (ref 0–40)
Albumin: 4.5 g/dL (ref 3.6–4.8)
Alkaline Phosphatase: 105 IU/L (ref 39–117)
BILIRUBIN TOTAL: 0.5 mg/dL (ref 0.0–1.2)
BUN / CREAT RATIO: 21 (ref 10–24)
BUN: 19 mg/dL (ref 8–27)
CALCIUM: 9.6 mg/dL (ref 8.6–10.2)
CO2: 24 mmol/L (ref 18–29)
Chloride: 101 mmol/L (ref 96–106)
Creatinine, Ser: 0.89 mg/dL (ref 0.76–1.27)
GFR, EST AFRICAN AMERICAN: 103 mL/min/{1.73_m2} (ref >59)
GFR, EST NON AFRICAN AMERICAN: 89 mL/min/{1.73_m2} (ref >59)
GLOBULIN, TOTAL: 1.9 g/dL (ref 1.5–4.5)
Glucose: 113 mg/dL — ABNORMAL HIGH (ref 65–99)
POTASSIUM: 5.1 mmol/L (ref 3.5–5.2)
SODIUM: 140 mmol/L (ref 134–144)
TOTAL PROTEIN: 6.4 g/dL (ref 6.0–8.5)

## 2016-12-25 LAB — CBC
Hematocrit: 42.7 % (ref 37.5–51.0)
Hemoglobin: 14.4 g/dL (ref 13.0–17.7)
MCH: 30.2 pg (ref 26.6–33.0)
MCHC: 33.7 g/dL (ref 31.5–35.7)
MCV: 90 fL (ref 79–97)
PLATELETS: 348 10*3/uL (ref 150–379)
RBC: 4.77 x10E6/uL (ref 4.14–5.80)
RDW: 14.7 % (ref 12.3–15.4)
WBC: 7.4 10*3/uL (ref 3.4–10.8)

## 2016-12-25 LAB — T4 AND TSH
T4, Total: 8 ug/dL (ref 4.5–12.0)
TSH: 1.6 u[IU]/mL (ref 0.450–4.500)

## 2016-12-25 LAB — LIPID PANEL
CHOLESTEROL TOTAL: 186 mg/dL (ref 100–199)
Chol/HDL Ratio: 3.6 ratio (ref 0.0–5.0)
HDL: 52 mg/dL (ref >39)
LDL CALC: 108 mg/dL — AB (ref 0–99)
Triglycerides: 132 mg/dL (ref 0–149)
VLDL Cholesterol Cal: 26 mg/dL (ref 5–40)

## 2016-12-25 LAB — TESTOSTERONE,FREE AND TOTAL
TESTOSTERONE FREE: 12 pg/mL (ref 6.6–18.1)
Testosterone: 855 ng/dL (ref 264–916)

## 2016-12-30 ENCOUNTER — Other Ambulatory Visit: Payer: Self-pay | Admitting: Family Medicine

## 2016-12-30 DIAGNOSIS — R3911 Hesitancy of micturition: Secondary | ICD-10-CM

## 2016-12-30 MED ORDER — TAMSULOSIN HCL 0.4 MG PO CAPS: 0.4000 mg | ORAL_CAPSULE | Freq: Every day | ORAL | 4 refills | Status: DC

## 2016-12-30 NOTE — Telephone Encounter (Signed)
[facility] faxed a request for a 90-day supply on the following medication.   tamsulosin (FLOMAX) 0.4 MG CAPS capsule  Take ! Capsule by mouth once daily.

## 2016-12-30 NOTE — Telephone Encounter (Signed)
Requesting 90 supply

## 2017-02-10 DIAGNOSIS — R0602 Shortness of breath: Secondary | ICD-10-CM | POA: Diagnosis not present

## 2017-02-10 DIAGNOSIS — I447 Left bundle-branch block, unspecified: Secondary | ICD-10-CM | POA: Insufficient documentation

## 2017-02-10 DIAGNOSIS — E782 Mixed hyperlipidemia: Secondary | ICD-10-CM | POA: Diagnosis not present

## 2017-02-10 DIAGNOSIS — I10 Essential (primary) hypertension: Secondary | ICD-10-CM | POA: Diagnosis not present

## 2017-02-25 ENCOUNTER — Encounter: Payer: Self-pay | Admitting: *Deleted

## 2017-02-27 ENCOUNTER — Ambulatory Visit: Payer: Self-pay | Admitting: General Surgery

## 2017-03-03 ENCOUNTER — Encounter: Payer: Self-pay | Admitting: General Surgery

## 2017-03-03 ENCOUNTER — Ambulatory Visit (INDEPENDENT_AMBULATORY_CARE_PROVIDER_SITE_OTHER): Payer: PPO | Admitting: General Surgery

## 2017-03-03 VITALS — BP 136/74 | HR 76 | Resp 14 | Ht 73.0 in | Wt 220.0 lb

## 2017-03-03 DIAGNOSIS — Z85038 Personal history of other malignant neoplasm of large intestine: Secondary | ICD-10-CM

## 2017-03-03 NOTE — Patient Instructions (Addendum)
Patient will be scheduled for a colonoscopy following his cardiac clearance from his cardiologist-Dr. Nehemiah Massed.   Colonoscopy, Adult A colonoscopy is an exam to look at the entire large intestine. During the exam, a lubricated, bendable tube is inserted into the anus and then passed into the rectum, colon, and other parts of the large intestine. A colonoscopy is often done as a part of normal colorectal screening or in response to certain symptoms, such as anemia, persistent diarrhea, abdominal pain, and blood in the stool. The exam can help screen for and diagnose medical problems, including:  Tumors.  Polyps.  Inflammation.  Areas of bleeding.  Tell a health care provider about:  Any allergies you have.  All medicines you are taking, including vitamins, herbs, eye drops, creams, and over-the-counter medicines.  Any problems you or family members have had with anesthetic medicines.  Any blood disorders you have.  Any surgeries you have had.  Any medical conditions you have.  Any problems you have had passing stool. What are the risks? Generally, this is a safe procedure. However, problems may occur, including:  Bleeding.  A tear in the intestine.  A reaction to medicines given during the exam.  Infection (rare).  What happens before the procedure? Eating and drinking restrictions Follow instructions from your health care provider about eating and drinking, which may include:  A few days before the procedure - follow a low-fiber diet. Avoid nuts, seeds, dried fruit, raw fruits, and vegetables.  1-3 days before the procedure - follow a clear liquid diet. Drink only clear liquids, such as clear broth or bouillon, black coffee or tea, clear juice, clear soft drinks or sports drinks, gelatin dessert, and popsicles. Avoid any liquids that contain red or purple dye.  On the day of the procedure - do not eat or drink anything during the 2 hours before the procedure, or  within the time period that your health care provider recommends.  Bowel prep If you were prescribed an oral bowel prep to clean out your colon:  Take it as told by your health care provider. Starting the day before your procedure, you will need to drink a large amount of medicated liquid. The liquid will cause you to have multiple loose stools until your stool is almost clear or light green.  If your skin or anus gets irritated from diarrhea, you may use these to relieve the irritation: ? Medicated wipes, such as adult wet wipes with aloe and vitamin E. ? A skin soothing-product like petroleum jelly.  If you vomit while drinking the bowel prep, take a break for up to 60 minutes and then begin the bowel prep again. If vomiting continues and you cannot take the bowel prep without vomiting, call your health care provider.  General instructions  Ask your health care provider about changing or stopping your regular medicines. This is especially important if you are taking diabetes medicines or blood thinners.  Plan to have someone take you home from the hospital or clinic. What happens during the procedure?  An IV tube may be inserted into one of your veins.  You will be given medicine to help you relax (sedative).  To reduce your risk of infection: ? Your health care team will wash or sanitize their hands. ? Your anal area will be washed with soap.  You will be asked to lie on your side with your knees bent.  Your health care provider will lubricate a long, thin, flexible tube. The tube will  have a camera and a light on the end.  The tube will be inserted into your anus.  The tube will be gently eased through your rectum and colon.  Air will be delivered into your colon to keep it open. You may feel some pressure or cramping.  The camera will be used to take images during the procedure.  A small tissue sample may be removed from your body to be examined under a microscope  (biopsy). If any potential problems are found, the tissue will be sent to a lab for testing.  If small polyps are found, your health care provider may remove them and have them checked for cancer cells.  The tube that was inserted into your anus will be slowly removed. The procedure may vary among health care providers and hospitals. What happens after the procedure?  Your blood pressure, heart rate, breathing rate, and blood oxygen level will be monitored until the medicines you were given have worn off.  Do not drive for 24 hours after the exam.  You may have a small amount of blood in your stool.  You may pass gas and have mild abdominal cramping or bloating due to the air that was used to inflate your colon during the exam.  It is up to you to get the results of your procedure. Ask your health care provider, or the department performing the procedure, when your results will be ready. This information is not intended to replace advice given to you by your health care provider. Make sure you discuss any questions you have with your health care provider. Document Released: 08/09/2000 Document Revised: 06/12/2016 Document Reviewed: 10/24/2015 Elsevier Interactive Patient Education  2018 [employer].

## 2017-03-03 NOTE — Progress Notes (Signed)
Patient ID: Derek Jackson, male   DOB: 02/05/1951, 66 y.o.   MRN: [id number]  Chief Complaint  Patient presents with  . Colonoscopy    HPI Derek Jackson is a 66 y.o. male is here for a 5 year follow up colonoscopy. Last one was completed in 2013. Patient reports no gastrointestinal issues.Moves his bowels regularly. Patient was diagnosed in 2015 with Congestive heart failure. Patient's cardiologist Dr. Nehemiah Massed. Patient has a heart ultrasound scheduled for 03/17/17, patient states may be a candidate for a Psychologist, forensic. HPI  Past Medical History:  Diagnosis Date  . BPH (benign prostatic hypertrophy)   . Esophagitis, reflux   . Kidney stone   . Rosacea   . Systolic CHF ([place]) 5784    Past Surgical History:  Procedure Laterality Date  . Abdominal wall mass excision  2010   Dr. Bary Castilla  . CARDIAC CATHETERIZATION  07/2014   No blockages. per patient one side of heart was not functioning well, CHF-Dr. Nehemiah Massed  . CHOLECYSTECTOMY  2005   Lapraroscopic  . COLONOSCOPY  [id number]  . CT Scan of Abdomen  12/05/2008   Soft Tissue mass 2.42cm x 3.98 cm of Abdominal wall lateral to umbilicus, pathology shows fibromatosis. Multiple large liver cysts up to 9cm x 9cm  . KNEE SURGERY  1994   Arthroscopy. Surgery by Dr. Marry Guan  . RIGHT COLECTOMY  2006   Dr.Byrnett  . TONSILLECTOMY AND ADENOIDECTOMY  1960's  . UPPER GI ENDOSCOPY  2010    Family History  Problem Relation Age of Onset  . Colon polyps Father   . Diabetes Father   . CAD Father        had CABG  . Stroke Father   . Dementia Father   . Congestive Heart Failure Mother   . Heart attack Maternal Grandfather   . Diabetes Paternal Grandmother   . Heart attack Paternal Grandfather   . Heart disease Brother        also had history of VSD  . Aneurysm Brother     Social History Social History  Substance Use Topics  . Smoking status: Former Smoker    Packs/day: 1.00    Years: 8.00    Types: Cigarettes    Quit date: 04/26/1990   . Smokeless tobacco: Never Used  . Alcohol use No    No Known Allergies  Current Outpatient Prescriptions  Medication Sig Dispense Refill  . lisinopril (PRINIVIL,ZESTRIL) 20 MG tablet Take 1 tablet by mouth daily.    . metoprolol tartrate (LOPRESSOR) 25 MG tablet Take 1 tablet by mouth 2 (two) times daily.    . Multiple Vitamin (MULTIVITAMIN) capsule Take 1 capsule by mouth daily.    . ranitidine (ZANTAC) 300 MG tablet TAKE 1 TABLET BY MOUTH TWICE A DAY 180 tablet 4  . spironolactone (ALDACTONE) 25 MG tablet Take 12.5 mg by mouth daily.     . tamsulosin (FLOMAX) 0.4 MG CAPS capsule Take 0.4 mg by mouth.     No current facility-administered medications for this visit.     Review of Systems Review of Systems  Constitutional: Negative.   Respiratory: Negative.   Cardiovascular: Negative.   Gastrointestinal: Negative.     Blood pressure 136/74, pulse 76, resp. rate 14, height 6\' 1"  (1.854 m), weight 220 lb (99.8 kg).  Physical Exam Physical Exam  Constitutional: He is oriented to person, place, and time. He appears well-developed and well-nourished.  Eyes: Conjunctivae are normal. No scleral icterus.  Neck: Neck supple. No  thyromegaly present.  Cardiovascular: Normal rate, regular rhythm and normal heart sounds.   No distal edema.  Pulmonary/Chest: Effort normal and breath sounds normal.  Abdominal: Soft. Bowel sounds are normal.    Neurological: He is alert and oriented to person, place, and time.  Skin: Skin is warm and dry.    Data Reviewed Colonoscopy completed 02/13/2012 was normal. Plan for repeat in 5 years. Screening colonoscopy in 2006 showed a large mass in the Appendix flexure with foci of carcinoma without invasion. Node negative.  Assessment    Candidate for follow-up colonoscopy based on past history.  Interval development of CHF, well controlled, possible need for pacemaker placement.    Plan    Based on the patient's clinical history it sounds  that the patient may have had a component of alcoholic cardiomyopathy which is improved since he discontinued drinking.    We will put plans for colonoscopy on hold until the patient has his follow-up visit with Dr. Nehemiah Massed from cardiology. Once he is cleared, we'll go ahead and schedule it without need for preprocedure visit.  Colonoscopy with possible biopsy/polypectomy prn: Information regarding the procedure, including its potential risks and complications (including but not limited to perforation of the bowel, which may require emergency surgery to repair, and bleeding) was verbally given to the patient. Educational information regarding lower intestinal endoscopy was given to the patient. Written instructions for how to complete the bowel prep using Miralax were provided. The importance of drinking ample fluids to avoid dehydration as a result of the prep emphasized.   HPI, Physical Exam, Assessment and Plan have been scribed under the direction and in the presence of Hervey Ard, MD.  Verlene Mayer, CMA  I have completed the exam and reviewed the above documentation for accuracy and completeness.  I agree with the above.  Haematologist has been used and any errors in dictation or transcription are unintentional.  Hervey Ard, M.D., F.A.C.S.  Patient will be scheduled for a colonoscopy following his cardiac clearance from his cardiologist-Dr. Nehemiah Massed.    Robert Bellow 03/04/2017, 7:38 PM  Colonoscopy instructions were reviewed with the patient today. Miralax prescription will be sent in once date is arranged. Patient will only take his usual blood pressure medications the morning of procedure. Patient aware Dr. Alveria Apley office will need to fax cardiac clearance to our office before we can arrange.   Dominga [place], CMA

## 2017-03-17 ENCOUNTER — Telehealth: Payer: Self-pay | Admitting: Family Medicine

## 2017-03-17 ENCOUNTER — Telehealth: Payer: Self-pay | Admitting: *Deleted

## 2017-03-17 DIAGNOSIS — R972 Elevated prostate specific antigen [PSA]: Secondary | ICD-10-CM

## 2017-03-17 DIAGNOSIS — R0602 Shortness of breath: Secondary | ICD-10-CM | POA: Diagnosis not present

## 2017-03-17 DIAGNOSIS — I447 Left bundle-branch block, unspecified: Secondary | ICD-10-CM | POA: Diagnosis not present

## 2017-03-17 DIAGNOSIS — I5022 Chronic systolic (congestive) heart failure: Secondary | ICD-10-CM | POA: Diagnosis not present

## 2017-03-17 DIAGNOSIS — I10 Essential (primary) hypertension: Secondary | ICD-10-CM | POA: Diagnosis not present

## 2017-03-17 MED ORDER — POLYETHYLENE GLYCOL 3350 17 GM/SCOOP PO POWD: ORAL | 0 refills | Status: DC

## 2017-03-17 NOTE — Telephone Encounter (Signed)
Message left for patient to call the office.   We received cardiac clearance from Dr. Alveria Apley office and can now arrange colonoscopy.   We need to allow at least 2 weeks for insurance approval.   Miralax prescription will need to be sent in once date arranged.

## 2017-03-17 NOTE — Telephone Encounter (Signed)
Please advise patient it is time to recheck PSA (rflx to free PSA) due to elevated PSA when checked in April. Please print order and leave at front desk for him to pick up.

## 2017-03-17 NOTE — Telephone Encounter (Signed)
Patient has been scheduled for a colonoscopy on 04-16-17 at [facility]. Miralax prescription has been sent in to the patient's pharmacy today. Colonoscopy instructions have been reviewed with the patient briefly over the phone today. He was given colonoscopy instructions at the time of his last office visit on 03-03-17.  This patient is aware to call the office if he has further questions.

## 2017-03-17 NOTE — Telephone Encounter (Signed)
Patient advised. Lab slip printed at front desk for pick up.

## 2017-03-18 ENCOUNTER — Other Ambulatory Visit: Payer: Self-pay | Admitting: Family Medicine

## 2017-03-18 ENCOUNTER — Other Ambulatory Visit: Payer: Self-pay | Admitting: General Surgery

## 2017-03-18 DIAGNOSIS — R972 Elevated prostate specific antigen [PSA]: Secondary | ICD-10-CM | POA: Insufficient documentation

## 2017-03-18 DIAGNOSIS — Z85038 Personal history of other malignant neoplasm of large intestine: Principal | ICD-10-CM

## 2017-03-18 DIAGNOSIS — Z1211 Encounter for screening for malignant neoplasm of colon: Secondary | ICD-10-CM

## 2017-03-18 LAB — FPSA% REFLEX
% FREE PSA: 16.2 %
PSA, FREE: 0.76 ng/mL

## 2017-03-18 LAB — PSA TOTAL (REFLEX TO FREE): Prostate Specific Ag, Serum: 4.7 ng/mL — ABNORMAL HIGH (ref 0.0–4.0)

## 2017-03-21 ENCOUNTER — Encounter: Payer: Self-pay | Admitting: Urology

## 2017-03-21 ENCOUNTER — Ambulatory Visit (INDEPENDENT_AMBULATORY_CARE_PROVIDER_SITE_OTHER): Payer: PPO | Admitting: Urology

## 2017-03-21 VITALS — BP 108/65 | HR 61 | Ht 73.0 in | Wt 217.4 lb

## 2017-03-21 DIAGNOSIS — R972 Elevated prostate specific antigen [PSA]: Secondary | ICD-10-CM | POA: Diagnosis not present

## 2017-03-21 NOTE — Progress Notes (Signed)
03/21/2017 9:11 AM   Derek Jackson 10-22-1950 [id number]  Referring provider: Birdie Sons, [place], [place] [address]  Chief Complaint  Patient presents with  . Elevated PSA    HPI: The patient is a 66 year old gentleman presents today for evaluation of his PSA.  1. Elevated PSA His PSA was 4.7 in July 2018. Prior to that it was 4.2 in May 2018. In April 2017, it was 3.0.  No personal or family history of prostate cancer  2. BPH Currently on Flomax. I PSS 14/3. Nocturia 2. He has frequency about half the time. He has minor symptoms and all other categories. His urinary quality of life is mixed.  PMH: Past Medical History:  Diagnosis Date  . BPH (benign prostatic hypertrophy)   . Esophagitis, reflux   . Kidney stone   . Rosacea   . Systolic CHF [facility]) [id number]    Surgical History: Past Surgical History:  Procedure Laterality Date  . Abdominal wall mass excision  2010   Dr. Bary Castilla  . CARDIAC CATHETERIZATION  07/2014   No blockages. per patient one side of heart was not functioning well, CHF-Dr. Nehemiah Massed  . CHOLECYSTECTOMY  2005   Lapraroscopic  . COLONOSCOPY  [id number]  . CT Scan of Abdomen  12/05/2008   Soft Tissue mass 2.42cm x 3.98 cm of Abdominal wall lateral to umbilicus, pathology shows fibromatosis. Multiple large liver cysts up to 9cm x 9cm  . KNEE SURGERY  1994   Arthroscopy. Surgery by Dr. Marry Guan  . RIGHT COLECTOMY  2006   Dr.Byrnett  . TONSILLECTOMY AND ADENOIDECTOMY  1960's  . UPPER GI ENDOSCOPY  2010    Home Medications:  Allergies as of 03/21/2017   No Known Allergies     Medication List       Accurate as of 03/21/17  9:11 AM. Always use your most recent med list.          FLOMAX 0.4 MG Caps capsule Generic drug:  tamsulosin Take 0.4 mg by mouth.   lisinopril 20 MG tablet Commonly known as:  PRINIVIL,ZESTRIL Take 1 tablet by mouth daily.   metoprolol tartrate 25 MG tablet Commonly known as:   LOPRESSOR Take 1 tablet by mouth 2 (two) times daily.   multivitamin capsule Take 1 capsule by mouth daily.   polyethylene glycol powder powder Commonly known as:  GLYCOLAX/MIRALAX 255 grams one bottle for colonoscopy prep   ranitidine 300 MG tablet Commonly known as:  ZANTAC TAKE 1 TABLET BY MOUTH TWICE A DAY   spironolactone 25 MG tablet Commonly known as:  ALDACTONE Take 12.5 mg by mouth daily.       Allergies: No Known Allergies  Family History: Family History  Problem Relation Age of Onset  . Colon polyps Father   . Diabetes Father   . CAD Father        had CABG  . Stroke Father   . Dementia Father   . Congestive Heart Failure Mother   . Heart attack Maternal Grandfather   . Diabetes Paternal Grandmother   . Heart attack Paternal Grandfather   . Heart disease Brother        also had history of VSD  . Aneurysm Brother   . Prostate cancer Neg Hx   . Bladder Cancer Neg Hx   . Kidney cancer Neg Hx     Social History:  reports that he quit smoking about 26 years ago. His smoking use included Cigarettes. He  has a 8.00 pack-year smoking history. He has never used smokeless tobacco. He reports that he does not drink alcohol or use drugs.  ROS: UROLOGY Frequent Urination?: Yes Hard to postpone urination?: Yes Burning/pain with urination?: No Get up at night to urinate?: Yes Leakage of urine?: No Urine stream starts and stops?: No Trouble starting stream?: No Do you have to strain to urinate?: No Blood in urine?: No Urinary tract infection?: No Sexually transmitted disease?: No Injury to kidneys or bladder?: No Painful intercourse?: No Weak stream?: No Erection problems?: No Penile pain?: No  Gastrointestinal Nausea?: No Vomiting?: No Indigestion/heartburn?: No Diarrhea?: No Constipation?: No  Constitutional Fever: No Night sweats?: No Weight loss?: No Fatigue?: Yes  Skin Skin rash/lesions?: No Itching?: No  Eyes Blurred vision?:  No Double vision?: No  Ears/Nose/Throat Sore throat?: Yes Sinus problems?: No  Hematologic/Lymphatic Swollen glands?: No Easy bruising?: Yes  Cardiovascular Leg swelling?: No Chest pain?: No  Respiratory Cough?: No Shortness of breath?: Yes  Endocrine Excessive thirst?: No  Musculoskeletal Back pain?: Yes Joint pain?: No  Neurological Headaches?: No Dizziness?: Yes  Psychologic Depression?: Yes Anxiety?: No  Physical Exam: BP 108/65 (BP Location: Left Arm, Patient Position: Sitting, Cuff Size: Normal)   Pulse 61   Ht 6\' 1"  (1.854 m)   Wt 217 lb 6.4 oz (98.6 kg)   BMI 28.68 kg/m   Constitutional:  Alert and oriented, No acute distress. HEENT: [place] AT, moist mucus membranes.  Trachea midline, no masses. Cardiovascular: No clubbing, cyanosis, or edema. Respiratory: Normal respiratory effort, no increased work of breathing. GI: Abdomen is soft, nontender, nondistended, no abdominal masses GU: No CVA tenderness. Normal phallus. Testicles descended equal bilaterally. Benign. DRE: 2+ smooth benign. Skin: No rashes, bruises or suspicious lesions. Lymph: No cervical or inguinal adenopathy. Neurologic: Grossly intact, no focal deficits, moving all 4 extremities. Psychiatric: Normal mood and affect.  Laboratory Data: Lab Results  Component Value Date   WBC 7.4 12/23/2016   HGB 14.4 12/23/2016   HCT 42.7 12/23/2016   MCV 90 12/23/2016   PLT 348 12/23/2016    Lab Results  Component Value Date   CREATININE 0.89 12/23/2016    Lab Results  Component Value Date   PSA 2.3 12/14/2014    Lab Results  Component Value Date   TESTOSTERONE 855 12/23/2016    No results found for: HGBA1C  Urinalysis    Component Value Date/Time   BILIRUBINUR negative 11/01/2016 1107   PROTEINUR negative 11/01/2016 1107   UROBILINOGEN 0.2 11/01/2016 1107   NITRITE negative 11/01/2016 1107   LEUKOCYTESUR Negative 11/01/2016 1107     Assessment & Plan:   1. Elevated PSA I  discussed the patient that his PSA has risen from last year and is now been elevated twice. This point, we would recommend for him to undergo a prostate biopsy. This test the risks, benefits, indications of this procedure. He understands the risks include but are not limited to bleeding and infection. He noticed expect blood in his urine and stool for 24 hours in his semen for up to 6 weeks. He understands the risk of infection despite antibiotics is approximate 1% that would require hospitalization for IV antibiotics. All questions were answered. The patient has elected to proceed.  Return for prostate biopsy.  Nickie Retort, MD  [facility] [address] [place], [place] [address] [phone]

## 2017-03-31 ENCOUNTER — Other Ambulatory Visit: Payer: Self-pay | Admitting: *Deleted

## 2017-03-31 ENCOUNTER — Telehealth: Payer: Self-pay | Admitting: *Deleted

## 2017-03-31 MED ORDER — POLYETHYLENE GLYCOL 3350 17 GM/SCOOP PO POWD: ORAL | 0 refills | Status: DC

## 2017-03-31 NOTE — Telephone Encounter (Signed)
Patient notified.   He will contact the office if he feels he needs to reschedule.

## 2017-03-31 NOTE — Telephone Encounter (Signed)
Probably okay. Unless he is having severe pain 48 hours prior to the procedure, I think we can go ahead without much trouble. If he is having severe pain 3 days after the procedure, we'll reschedule a colonoscopy.

## 2017-03-31 NOTE — Telephone Encounter (Signed)
Patient called the office to let us know that he needs to have a biopsy done on his prostate due to elevated PSA levels. This is scheduled as an office biopsy on 04-11-17.   He is presently scheduled for a colonoscopy on 04-16-17 and wants to make sure the biopsy that is already scheduled will not interfere with colonoscopy.

## 2017-04-09 ENCOUNTER — Telehealth: Payer: Self-pay | Admitting: *Deleted

## 2017-04-09 NOTE — Telephone Encounter (Signed)
Patient reports no medication changes and he has picked up his Miralax prescription. He has no questions about prep.

## 2017-04-09 NOTE — Telephone Encounter (Signed)
Message left for patient to call the office.   We need to confirm he has had no medication changes since his last office visit. Also, need to make sure that patient has picked up his Miralax prescription.   Colonoscopy scheduled for 04-16-17 at [facility].

## 2017-04-11 ENCOUNTER — Other Ambulatory Visit: Payer: Self-pay | Admitting: Urology

## 2017-04-11 ENCOUNTER — Ambulatory Visit (INDEPENDENT_AMBULATORY_CARE_PROVIDER_SITE_OTHER): Payer: PPO | Admitting: Urology

## 2017-04-11 DIAGNOSIS — R972 Elevated prostate specific antigen [PSA]: Secondary | ICD-10-CM

## 2017-04-11 MED ORDER — LEVOFLOXACIN 500 MG PO TABS
500.0000 mg | ORAL_TABLET | Freq: Once | ORAL | Status: AC
  Administered 2017-04-11: 500 mg via ORAL

## 2017-04-11 MED ORDER — LIDOCAINE HCL 2 % EX GEL
1.0000 "application " | Freq: Once | CUTANEOUS | Status: AC
  Administered 2017-04-11: 1 via URETHRAL

## 2017-04-11 MED ORDER — GENTAMICIN SULFATE 40 MG/ML IJ SOLN
80.0000 mg | Freq: Once | INTRAMUSCULAR | Status: AC
  Administered 2017-04-11: 80 mg via INTRAMUSCULAR

## 2017-04-11 NOTE — Progress Notes (Signed)
03/21/2017 9:11 AM   Derek Jackson Mar 27, 1951 [id number]  Referring provider: Birdie Sons, [place] [address], [place] [address]  Chief Complaint  Patient presents with  . Elevated PSA  Prostate Biopsy   HPI:  1. Elevated PSA - No FX prostate cancer. Rising PSA noted 2018.  11/2015 - 3.0 12/2016 - 4.2 02/2017 - 4.7 ==> TRUS BX 03/2017 53 mL, no median lobe    2. Lower Urinary Tract Symptoms - on tamsulsoin daily for mild obstructive symptoms with good control.   PMH: Past Medical History:  Diagnosis Date  . BPH (benign prostatic hypertrophy)   . Esophagitis, reflux   . Kidney stone   . Rosacea   . Systolic CHF [facility]) 7619    Surgical History: Past Surgical History:  Procedure Laterality Date  . Abdominal wall mass excision  2010   Dr. Bary Castilla  . CARDIAC CATHETERIZATION  07/2014   No blockages. per patient one side of heart was not functioning well, CHF-Dr. Nehemiah Massed  . CHOLECYSTECTOMY  2005   Lapraroscopic  . COLONOSCOPY  [id number]  . CT Scan of Abdomen  12/05/2008   Soft Tissue mass 2.42cm x 3.98 cm of Abdominal wall lateral to umbilicus, pathology shows fibromatosis. Multiple large liver cysts up to 9cm x 9cm  . KNEE SURGERY  1994   Arthroscopy. Surgery by Dr. Marry Guan  . RIGHT COLECTOMY  2006   Dr.Byrnett  . TONSILLECTOMY AND ADENOIDECTOMY  1960's  . UPPER GI ENDOSCOPY  2010    Home Medications:  Allergies as of 03/21/2017   No Known Allergies     Medication List       Accurate as of 03/21/17  9:11 AM. Always use your most recent med list.          FLOMAX 0.4 MG Caps capsule Generic drug:  tamsulosin Take 0.4 mg by mouth.   lisinopril 20 MG tablet Commonly known as:  PRINIVIL,ZESTRIL Take 1 tablet by mouth daily.   metoprolol tartrate 25 MG tablet Commonly known as:  LOPRESSOR Take 1 tablet by mouth 2 (two) times daily.   multivitamin capsule Take 1 capsule by mouth daily.   polyethylene glycol powder  powder Commonly known as:  GLYCOLAX/MIRALAX 255 grams one bottle for colonoscopy prep   ranitidine 300 MG tablet Commonly known as:  ZANTAC TAKE 1 TABLET BY MOUTH TWICE A DAY   spironolactone 25 MG tablet Commonly known as:  ALDACTONE Take 12.5 mg by mouth daily.       Allergies: No Known Allergies  Family History: Family History  Problem Relation Age of Onset  . Colon polyps Father   . Diabetes Father   . CAD Father        had CABG  . Stroke Father   . Dementia Father   . Congestive Heart Failure Mother   . Heart attack Maternal Grandfather   . Diabetes Paternal Grandmother   . Heart attack Paternal Grandfather   . Heart disease Brother        also had history of VSD  . Aneurysm Brother   . Prostate cancer Neg Hx   . Bladder Cancer Neg Hx   . Kidney cancer Neg Hx     Social History:  reports that he quit smoking about 26 years ago. His smoking use included Cigarettes. He has a 8.00 pack-year smoking history. He has never used smokeless tobacco. He reports that he does not drink alcohol or use drugs.  ROS: UROLOGY Frequent  Urination?: Yes Hard to postpone urination?: Yes Burning/pain with urination?: No Get up at night to urinate?: Yes Leakage of urine?: No Urine stream starts and stops?: No Trouble starting stream?: No Do you have to strain to urinate?: No Blood in urine?: No Urinary tract infection?: No Sexually transmitted disease?: No Injury to kidneys or bladder?: No Painful intercourse?: No Weak stream?: No Erection problems?: No Penile pain?: No  Gastrointestinal Nausea?: No Vomiting?: No Indigestion/heartburn?: No Diarrhea?: No Constipation?: No  Constitutional Fever: No Night sweats?: No Weight loss?: No Fatigue?: Yes  Skin Skin rash/lesions?: No Itching?: No  Eyes Blurred vision?: No Double vision?: No  Ears/Nose/Throat Sore throat?: Yes Sinus problems?: No  Hematologic/Lymphatic Swollen glands?: No Easy bruising?:  Yes  Cardiovascular Leg swelling?: No Chest pain?: No  Respiratory Cough?: No Shortness of breath?: Yes  Endocrine Excessive thirst?: No  Musculoskeletal Back pain?: Yes Joint pain?: No  Neurological Headaches?: No Dizziness?: Yes  Psychologic Depression?: Yes Anxiety?: No  Physical Exam: BP 108/65 (BP Location: Left Arm, Patient Position: Sitting, Cuff Size: Normal)   Pulse 61   Ht 6\' 1"  (1.854 m)   Wt 217 lb 6.4 oz (98.6 kg)   BMI 28.68 kg/m   Constitutional:  Alert and oriented, No acute distress. HEENT: [place] AT, moist mucus membranes.  Trachea midline, no masses. Cardiovascular: No clubbing, cyanosis, or edema. Respiratory: Normal respiratory effort, no increased work of breathing. GI: Abdomen is soft, nontender, nondistended, no abdominal masses GU: No CVA tenderness. Normal phallus. Testicles descended equal bilaterally. Benign. DRE: 2+ smooth benign. Skin: No rashes, bruises or suspicious lesions. Lymph: No cervical or inguinal adenopathy. Neurologic: Grossly intact, no focal deficits, moving all 4 extremities. Psychiatric: Normal mood and affect.  Laboratory Data: Lab Results  Component Value Date   WBC 7.4 12/23/2016   HGB 14.4 12/23/2016   HCT 42.7 12/23/2016   MCV 90 12/23/2016   PLT 348 12/23/2016    Lab Results  Component Value Date   CREATININE 0.89 12/23/2016    Lab Results  Component Value Date   PSA 2.3 12/14/2014    Lab Results  Component Value Date   TESTOSTERONE 855 12/23/2016    No results found for: HGBA1C  Urinalysis    Component Value Date/Time   BILIRUBINUR negative 11/01/2016 1107   PROTEINUR negative 11/01/2016 1107   UROBILINOGEN 0.2 11/01/2016 1107   NITRITE negative 11/01/2016 1107   LEUKOCYTESUR Negative 11/01/2016 1107   Prostate Biopsy Procedure   Informed consent was obtained after discussing risks/benefits of the procedure.  A time out was performed to ensure correct patient  identity.  Pre-Procedure: - Gentamicin given prophylactically - Levaquin 500 mg administered PO -Transrectal Ultrasound performed revealing a 53 gm prostate -No significant hypoechoic or median lobe noted  Procedure: - Prostate block performed using 10 cc 1% lidocaine and biopsies taken from sextant areas, a total of 12 under ultrasound guidance.  Post-Procedure: - Patient tolerated the procedure well - He was counseled to seek immediate medical attention if experiences any severe pain, significant bleeding, or fevers - Return in one week to discuss biopsy results  Assessment & Plan:   1. Elevated PSA - s/p BX today as per above. Warned to contact MD for new high fevers, inability to void, or large bleedign with dizziness.    2. Lower Urinary Tract Symptoms - continue tamsulosin.  RTC for face to face path review.    [place] [address] [place], [place] [address] [phone]

## 2017-04-16 ENCOUNTER — Other Ambulatory Visit: Payer: Self-pay | Admitting: Urology

## 2017-04-16 ENCOUNTER — Ambulatory Visit
Admission: RE | Admit: 2017-04-16 | Discharge: 2017-04-16 | Disposition: A | Discharge status: Home / Self Care | Payer: PPO | Source: Ambulatory Visit | Attending: General Surgery | Admitting: General Surgery

## 2017-04-16 ENCOUNTER — Encounter
Admission: RE | Disposition: A | Discharge status: Home / Self Care | Payer: Self-pay | Source: Ambulatory Visit | Attending: General Surgery

## 2017-04-16 ENCOUNTER — Encounter: Payer: Self-pay | Admitting: Anesthesiology

## 2017-04-16 ENCOUNTER — Ambulatory Visit: Payer: PPO | Admitting: Anesthesiology

## 2017-04-16 DIAGNOSIS — Z87891 Personal history of nicotine dependence: Secondary | ICD-10-CM | POA: Diagnosis not present

## 2017-04-16 DIAGNOSIS — Z9889 Other specified postprocedural states: Secondary | ICD-10-CM | POA: Insufficient documentation

## 2017-04-16 DIAGNOSIS — Z85038 Personal history of other malignant neoplasm of large intestine: Secondary | ICD-10-CM | POA: Diagnosis not present

## 2017-04-16 DIAGNOSIS — N400 Enlarged prostate without lower urinary tract symptoms: Secondary | ICD-10-CM | POA: Diagnosis not present

## 2017-04-16 DIAGNOSIS — K5730 Diverticulosis of large intestine without perforation or abscess without bleeding: Secondary | ICD-10-CM

## 2017-04-16 DIAGNOSIS — K210 Gastro-esophageal reflux disease with esophagitis: Secondary | ICD-10-CM | POA: Insufficient documentation

## 2017-04-16 DIAGNOSIS — L719 Rosacea, unspecified: Secondary | ICD-10-CM | POA: Diagnosis not present

## 2017-04-16 DIAGNOSIS — I5020 Unspecified systolic (congestive) heart failure: Secondary | ICD-10-CM | POA: Diagnosis not present

## 2017-04-16 DIAGNOSIS — Z87442 Personal history of urinary calculi: Secondary | ICD-10-CM | POA: Diagnosis not present

## 2017-04-16 DIAGNOSIS — Z9049 Acquired absence of other specified parts of digestive tract: Secondary | ICD-10-CM | POA: Diagnosis not present

## 2017-04-16 DIAGNOSIS — Z1211 Encounter for screening for malignant neoplasm of colon: Secondary | ICD-10-CM | POA: Diagnosis not present

## 2017-04-16 DIAGNOSIS — K219 Gastro-esophageal reflux disease without esophagitis: Secondary | ICD-10-CM | POA: Diagnosis not present

## 2017-04-16 DIAGNOSIS — Z79899 Other long term (current) drug therapy: Secondary | ICD-10-CM | POA: Insufficient documentation

## 2017-04-16 DIAGNOSIS — I509 Heart failure, unspecified: Secondary | ICD-10-CM | POA: Diagnosis not present

## 2017-04-16 HISTORY — PX: COLONOSCOPY WITH PROPOFOL: SHX5780

## 2017-04-16 LAB — PATHOLOGY REPORT

## 2017-04-16 SURGERY — COLONOSCOPY WITH PROPOFOL
Anesthesia: General

## 2017-04-16 MED ORDER — PROPOFOL 500 MG/50ML IV EMUL
INTRAVENOUS | Status: AC
  Filled 2017-04-16: qty 50

## 2017-04-16 MED ORDER — LIDOCAINE HCL (CARDIAC) 20 MG/ML IV SOLN
INTRAVENOUS | Status: DC | PRN
  Administered 2017-04-16: 100 mg via INTRAVENOUS

## 2017-04-16 MED ORDER — SODIUM CHLORIDE 0.9 % IV SOLN
INTRAVENOUS | Status: DC
  Administered 2017-04-16: 1000 mL via INTRAVENOUS

## 2017-04-16 MED ORDER — PROPOFOL 500 MG/50ML IV EMUL
INTRAVENOUS | Status: DC | PRN
Start: 2017-04-16 — End: 2017-04-16
  Administered 2017-04-16: 150 ug/kg/min via INTRAVENOUS

## 2017-04-16 MED ORDER — PROPOFOL 10 MG/ML IV BOLUS
INTRAVENOUS | Status: DC | PRN
  Administered 2017-04-16: 100 mg via INTRAVENOUS

## 2017-04-16 MED ORDER — LIDOCAINE HCL (PF) 2 % IJ SOLN
INTRAMUSCULAR | Status: AC
  Filled 2017-04-16: qty 2

## 2017-04-16 NOTE — Anesthesia Post-op Follow-up Note (Signed)
Anesthesia QCDR form completed.        

## 2017-04-16 NOTE — H&P (Signed)
Derek Jackson [id number] 10/25/1950     HPI: Follow up colonoscopy s/p right colectomy for insitu cancer. Tolerated prep well.   Prescriptions Prior to Admission  Medication Sig Dispense Refill Last Dose  . lisinopril (PRINIVIL,ZESTRIL) 20 MG tablet Take 1 tablet by mouth daily.   04/15/2017 at Unknown time  . metoprolol tartrate (LOPRESSOR) 25 MG tablet Take 1 tablet by mouth 2 (two) times daily.   04/15/2017 at Unknown time  . Multiple Vitamin (MULTIVITAMIN) capsule Take 1 capsule by mouth daily.   04/15/2017 at Unknown time  . polyethylene glycol powder (GLYCOLAX/MIRALAX) powder 255 grams one bottle for colonoscopy prep 255 g 0 04/15/2017 at Unknown time  . ranitidine (ZANTAC) 300 MG tablet TAKE 1 TABLET BY MOUTH TWICE A DAY 180 tablet 4 04/15/2017 at Unknown time  . spironolactone (ALDACTONE) 25 MG tablet Take 12.5 mg by mouth daily.    04/15/2017 at Unknown time  . tamsulosin (FLOMAX) 0.4 MG CAPS capsule Take 0.4 mg by mouth.   04/15/2017 at Unknown time   No Known Allergies Past Medical History:  Diagnosis Date  . BPH (benign prostatic hypertrophy)   . Esophagitis, reflux   . Kidney stone   . Rosacea   . Systolic CHF ([place]) 3762   Past Surgical History:  Procedure Laterality Date  . Abdominal wall mass excision  2010   Dr. Bary Castilla  . CARDIAC CATHETERIZATION  07/2014   No blockages. per patient one side of heart was not functioning well, CHF-Dr. Nehemiah Massed  . CHOLECYSTECTOMY  2005   Lapraroscopic  . COLONOSCOPY  [id number]  . CT Scan of Abdomen  12/05/2008   Soft Tissue mass 2.42cm x 3.98 cm of Abdominal wall lateral to umbilicus, pathology shows fibromatosis. Multiple large liver cysts up to 9cm x 9cm  . KNEE SURGERY  1994   Arthroscopy. Surgery by Dr. Marry Guan  . RIGHT COLECTOMY  2006   Dr.  . TONSILLECTOMY AND ADENOIDECTOMY  1960's  . UPPER GI ENDOSCOPY  2010   Social History   Social History  . Marital status: Single    Spouse name: N/A  . Number of children: 3   . Years of education: N/A   Occupational History  . Employed     Works at [employer]  . Smoking status: Former Smoker    Packs/day: 1.00    Years: 8.00    Types: Cigarettes    Quit date: 04/26/1990  . Smokeless tobacco: Never Used  . Alcohol use No  . Drug use: No  . Sexual activity: Not on file   Other Topics Concern  . Not on file   Social History Narrative   Retired April 2017, still working 3 days a week   Social History   Social History Narrative   Retired April 2017, still working 3 days a week     ROS: Negative.     PE: HEENT: Negative. Lungs: Clear. Cardio: RR.  Assessment/Plan:  Proceed with planned endoscopy. Robert Bellow 04/16/2017

## 2017-04-16 NOTE — Transfer of Care (Signed)
Immediate Anesthesia Transfer of Care Note  Patient: Derek Jackson  Procedure(s) Performed: Procedure(s): COLONOSCOPY WITH PROPOFOL (N/A)  Patient Location: Endoscopy Unit  Anesthesia Type:General  Level of Consciousness: awake and alert   Airway & Oxygen Therapy: Patient Spontanous Breathing and Patient connected to nasal cannula oxygen  Post-op Assessment: Report given to RN and Post -op Vital signs reviewed and stable  Post vital signs: Reviewed and stable  Last Vitals:  Vitals:   04/16/17 0821 04/16/17 0938  BP: 110/84 106/66  Pulse: 66 70  Resp: 16 16  Temp: (!) 35.8 C (!) 36.2 C  SpO2: 99% 100%    Last Pain:  Vitals:   04/16/17 0938  TempSrc: Tympanic         Complications: No apparent anesthesia complications

## 2017-04-16 NOTE — Anesthesia Preprocedure Evaluation (Signed)
Anesthesia Evaluation  Patient identified by MRN, date of birth, ID band Patient awake    Reviewed: Allergy & Precautions, H&P , NPO status , Patient's Chart, lab work & pertinent test results, reviewed documented beta blocker date and time   History of Anesthesia Complications Negative for: history of anesthetic complications  Airway Mallampati: I  TM Distance: >3 FB Neck ROM: full    Dental  (+) Dental Advidsory Given, Teeth Intact Permanent bridge on the top:   Pulmonary neg shortness of breath, sleep apnea (based on history, but never tested) , neg COPD, neg recent URI, former smoker,           Cardiovascular Exercise Tolerance: Good (-) hypertension(-) angina+CHF  (-) CAD, (-) Past MI, (-) Cardiac Stents and (-) CABG (-) dysrhythmias (-) Valvular Problems/Murmurs     Neuro/Psych negative neurological ROS  negative psych ROS   GI/Hepatic Neg liver ROS, GERD  ,  Endo/Other  negative endocrine ROS  Renal/GU Renal disease (kidney stone)  negative genitourinary   Musculoskeletal   Abdominal   Peds  Hematology negative hematology ROS (+)   Anesthesia Other Findings Past Medical History: No date: BPH (benign prostatic hypertrophy) No date: Esophagitis, reflux No date: Kidney stone No date: Rosacea 5188: Systolic CHF ([facility])   Reproductive/Obstetrics negative OB ROS                             Anesthesia Physical Anesthesia Plan  ASA: III  Anesthesia Plan: General   Post-op Pain Management:    Induction: Intravenous  PONV Risk Score and Plan: 2 and Propofol infusion  Airway Management Planned: Nasal Cannula and Natural Airway  Additional Equipment:   Intra-op Plan:   Post-operative Plan:   Informed Consent: I have reviewed the patients History and Physical, chart, labs and discussed the procedure including the risks, benefits and alternatives for the proposed anesthesia with  the patient or authorized representative who has indicated his/her understanding and acceptance.   Dental Advisory Given  Plan Discussed with: Anesthesiologist, CRNA and Surgeon  Anesthesia Plan Comments:         Anesthesia Quick Evaluation

## 2017-04-16 NOTE — Op Note (Signed)
[facility]enterology Patient Name: Derek Jackson Procedure Date: 04/16/2017 9:09 AM MRN: [id number] Account #: [id number] Date of Birth: Aug 02, 1951 Admit Type: Outpatient Age: 66 Room: [facility] ENDO ROOM 1 Gender: Male Note Status: Finalized Procedure:            Colonoscopy Indications:          High risk colon cancer surveillance: Personal history                        of colon cancer Providers:            Robert Bellow, MD Referring MD:         Kirstie Peri. Caryn Section, MD (Referring MD) Medicines:            Monitored Anesthesia Care Complications:        No immediate complications. Procedure:            Pre-Anesthesia Assessment:                       - Prior to the procedure, a History and Physical was                        performed, and patient medications, allergies and                        sensitivities were reviewed. The patient's tolerance of                        previous anesthesia was reviewed.                       - The risks and benefits of the procedure and the                        sedation options and risks were discussed with the                        patient. All questions were answered and informed                        consent was obtained.                       After obtaining informed consent, the colonoscope was                        passed under direct vision. Throughout the procedure,                        the patient's blood pressure, pulse, and oxygen                        saturations were monitored continuously. The Olympus                        CF-H180AL colonoscope ( S#: [id number] ) was introduced                        through the anus and advanced to the the ileocolonic  anastomosis. The colonoscopy was performed without                        difficulty. The patient tolerated the procedure well.                        The quality of the bowel preparation was excellent. Findings:      A few  medium-mouthed diverticula were found in the sigmoid colon.      The retroflexed view of the distal rectum and anal verge was normal and       showed no anal or rectal abnormalities. Impression:           - Diverticulosis in the sigmoid colon.                       - The distal rectum and anal verge are normal on                        retroflexion view.                       - No specimens collected. Recommendation:       - High fiber diet indefinitely.                       - Repeat colonoscopy in 5 years for surveillance. Procedure Code(s):    --- Professional ---                       [phone], Colonoscopy, flexible; diagnostic, including                        collection of specimen(s) by brushing or washing, when                        performed (separate procedure) Diagnosis Code(s):    --- Professional ---                       O75.643, Personal history of other malignant neoplasm                        of large intestine                       K57.30, Diverticulosis of large intestine without                        perforation or abscess without bleeding CPT copyright 2016 [employer] Association. All rights reserved. The codes documented in this report are preliminary and upon coder review may  be revised to meet current compliance requirements. Robert Bellow, MD 04/16/2017 9:31:24 AM This report has been signed electronically. Number of Addenda: 0 Note Initiated On: 04/16/2017 9:09 AM Scope Withdrawal Time: 0 hours 7 minutes 20 seconds  Total Procedure Duration: 0 hours 10 minutes 24 seconds       [facility]

## 2017-04-16 NOTE — Anesthesia Postprocedure Evaluation (Signed)
Anesthesia Post Note  Patient: Derek Jackson  Procedure(s) Performed: Procedure(s) (LRB): COLONOSCOPY WITH PROPOFOL (N/A)  Patient location during evaluation: Endoscopy Anesthesia Type: General Level of consciousness: awake and alert Pain management: pain level controlled Vital Signs Assessment: post-procedure vital signs reviewed and stable Respiratory status: spontaneous breathing, nonlabored ventilation, respiratory function stable and patient connected to nasal cannula oxygen Cardiovascular status: blood pressure returned to baseline and stable Postop Assessment: no signs of nausea or vomiting Anesthetic complications: no     Last Vitals:  Vitals:   04/16/17 0958 04/16/17 1008  BP: 120/79 134/84  Pulse: 65 65  Resp: 11 11  Temp:    SpO2: 98% 98%    Last Pain:  Vitals:   04/16/17 0938  TempSrc: Tympanic                 Martha Clan

## 2017-04-17 ENCOUNTER — Encounter: Payer: Self-pay | Admitting: General Surgery

## 2017-04-25 ENCOUNTER — Ambulatory Visit: Payer: PPO

## 2017-05-05 ENCOUNTER — Other Ambulatory Visit: Payer: Self-pay | Admitting: Urology

## 2017-05-05 ENCOUNTER — Ambulatory Visit: Payer: PPO | Admitting: Urology

## 2017-05-05 ENCOUNTER — Encounter: Payer: Self-pay | Admitting: Urology

## 2017-05-05 DIAGNOSIS — R972 Elevated prostate specific antigen [PSA]: Secondary | ICD-10-CM

## 2017-05-05 NOTE — Progress Notes (Signed)
Patient's recent prostate biopsy was negative.  Spoke with him on the phone about the results and will reschedule him to follow-up with [place] in 6 months with a PSA prior.

## 2017-05-07 DIAGNOSIS — L570 Actinic keratosis: Secondary | ICD-10-CM | POA: Diagnosis not present

## 2017-05-07 DIAGNOSIS — D2261 Melanocytic nevi of right upper limb, including shoulder: Secondary | ICD-10-CM | POA: Diagnosis not present

## 2017-05-07 DIAGNOSIS — L821 Other seborrheic keratosis: Secondary | ICD-10-CM | POA: Diagnosis not present

## 2017-05-07 DIAGNOSIS — D225 Melanocytic nevi of trunk: Secondary | ICD-10-CM | POA: Diagnosis not present

## 2017-05-07 DIAGNOSIS — X32XXXA Exposure to sunlight, initial encounter: Secondary | ICD-10-CM | POA: Diagnosis not present

## 2017-05-07 DIAGNOSIS — Z85828 Personal history of other malignant neoplasm of skin: Secondary | ICD-10-CM | POA: Diagnosis not present

## 2017-05-29 ENCOUNTER — Encounter: Payer: Self-pay | Admitting: General Surgery

## 2017-06-03 ENCOUNTER — Encounter: Payer: Self-pay | Admitting: General Surgery

## 2017-06-04 ENCOUNTER — Encounter: Payer: Self-pay | Admitting: General Surgery

## 2017-06-25 ENCOUNTER — Other Ambulatory Visit: Payer: Self-pay | Admitting: Family Medicine

## 2017-09-22 DIAGNOSIS — I10 Essential (primary) hypertension: Secondary | ICD-10-CM | POA: Diagnosis not present

## 2017-09-22 DIAGNOSIS — I447 Left bundle-branch block, unspecified: Secondary | ICD-10-CM | POA: Diagnosis not present

## 2017-09-22 DIAGNOSIS — R9431 Abnormal electrocardiogram [ECG] [EKG]: Secondary | ICD-10-CM | POA: Diagnosis not present

## 2017-09-22 DIAGNOSIS — E782 Mixed hyperlipidemia: Secondary | ICD-10-CM | POA: Diagnosis not present

## 2017-09-22 DIAGNOSIS — I5022 Chronic systolic (congestive) heart failure: Secondary | ICD-10-CM | POA: Diagnosis not present

## 2017-09-22 DIAGNOSIS — E875 Hyperkalemia: Secondary | ICD-10-CM | POA: Diagnosis not present

## 2017-10-27 ENCOUNTER — Other Ambulatory Visit: Payer: PPO

## 2017-11-03 ENCOUNTER — Ambulatory Visit: Payer: PPO

## 2017-11-10 ENCOUNTER — Other Ambulatory Visit: Payer: PPO

## 2017-11-10 DIAGNOSIS — R972 Elevated prostate specific antigen [PSA]: Secondary | ICD-10-CM | POA: Diagnosis not present

## 2017-11-11 LAB — FPSA% REFLEX
% FREE PSA: 20.2 %
PSA, FREE: 0.87 ng/mL

## 2017-11-11 LAB — PSA TOTAL (REFLEX TO FREE): PROSTATE SPECIFIC AG, SERUM: 4.3 ng/mL — AB (ref 0.0–4.0)

## 2017-11-17 ENCOUNTER — Encounter: Payer: Self-pay | Admitting: Urology

## 2017-11-17 ENCOUNTER — Ambulatory Visit (INDEPENDENT_AMBULATORY_CARE_PROVIDER_SITE_OTHER): Payer: PPO | Admitting: Urology

## 2017-11-17 VITALS — BP 117/80 | HR 69 | Resp 16 | Ht 73.0 in | Wt 223.4 lb

## 2017-11-17 DIAGNOSIS — R972 Elevated prostate specific antigen [PSA]: Secondary | ICD-10-CM | POA: Diagnosis not present

## 2017-11-17 NOTE — Progress Notes (Signed)
11/17/2017 8:46 AM   Derek Jackson Jul 23, 1951 [id number]  Referring provider: Birdie Sons, MD [address] [place], [place] [address]  Chief complaint:  follow-up  HPI:  a 67 year old male presents for follow-up of elevated PSA.  He underwent prostate biopsy in August 2018 for a PSA of 4.7.  Biopsy was benign.  Prostate volume was 53 mL.  He presents for a 86-month follow-up and has no complaints.  He has no bothersome lower urinary tract symptoms.  Denies dysuria or gross hematuria.  Denies flank, abdominal, pelvic or scrotal pain.  PSA performed last week was stable at 4.3  PMH: Past Medical History:  Diagnosis Date  . BPH (benign prostatic hypertrophy)   . Esophagitis, reflux   . Kidney stone   . Rosacea   . Systolic CHF [facility]) [id number]    Surgical History: Past Surgical History:  Procedure Laterality Date  . Abdominal wall mass excision  2010   Dr. Bary Castilla  . CARDIAC CATHETERIZATION  07/2014   No blockages. per patient one side of heart was not functioning well, CHF-Dr. Nehemiah Massed  . CHOLECYSTECTOMY  2005   Lapraroscopic  . COLONOSCOPY  [id number]  . COLONOSCOPY WITH PROPOFOL N/A 04/16/2017   Procedure: COLONOSCOPY WITH PROPOFOL;  Surgeon: Robert Bellow, MD;  Location: [facility] ENDOSCOPY;  Service: Endoscopy;  Laterality: N/A;  . CT Scan of Abdomen  12/05/2008   Soft Tissue mass 2.42cm x 3.98 cm of Abdominal wall lateral to umbilicus, pathology shows fibromatosis. Multiple large liver cysts up to 9cm x 9cm  . KNEE SURGERY  1994   Arthroscopy. Surgery by Dr. Marry Guan  . RIGHT COLECTOMY  2006   Dr.Byrnett  . TONSILLECTOMY AND ADENOIDECTOMY  1960's  . UPPER GI ENDOSCOPY  2010    Home Medications:  Allergies as of 11/17/2017   No Known Allergies     Medication List        Accurate as of 11/17/17  8:46 AM. Always use your most recent med list.          FLOMAX 0.4 MG Caps capsule Generic drug:  tamsulosin Take 0.4 mg by mouth.   lisinopril 20 MG  tablet Commonly known as:  PRINIVIL,ZESTRIL Take 1 tablet by mouth daily.   metoprolol tartrate 25 MG tablet Commonly known as:  LOPRESSOR Take 1 tablet by mouth 2 (two) times daily.   multivitamin capsule Take 1 capsule by mouth daily.   ranitidine 300 MG tablet Commonly known as:  ZANTAC TAKE 1 TABLET BY MOUTH TWICE A DAY   spironolactone 25 MG tablet Commonly known as:  ALDACTONE Take 12.5 mg by mouth daily.       Allergies: No Known Allergies  Family History: Family History  Problem Relation Age of Onset  . Colon polyps Father   . Diabetes Father   . CAD Father        had CABG  . Stroke Father   . Dementia Father   . Congestive Heart Failure Mother   . Heart attack Maternal Grandfather   . Diabetes Paternal Grandmother   . Heart attack Paternal Grandfather   . Heart disease Brother        also had history of VSD  . Aneurysm Brother   . Prostate cancer Neg Hx   . Bladder Cancer Neg Hx   . Kidney cancer Neg Hx     Social History:  reports that he quit smoking about 27 years ago. His smoking use included cigarettes. He has  a 8.00 pack-year smoking history. He has never used smokeless tobacco. He reports that he does not drink alcohol or use drugs.  ROS: UROLOGY Frequent Urination?: Yes Hard to postpone urination?: No Burning/pain with urination?: No Get up at night to urinate?: Yes Leakage of urine?: No Urine stream starts and stops?: No Trouble starting stream?: No Do you have to strain to urinate?: No Blood in urine?: No Urinary tract infection?: No Sexually transmitted disease?: No Injury to kidneys or bladder?: No Painful intercourse?: No Weak stream?: No Erection problems?: No Penile pain?: No  Gastrointestinal Nausea?: No Vomiting?: No Indigestion/heartburn?: No Diarrhea?: No Constipation?: No  Constitutional Fever: No Night sweats?: No Weight loss?: No Fatigue?: Yes  Skin Skin rash/lesions?: No Itching?: No  Eyes Blurred  vision?: No Double vision?: No  Ears/Nose/Throat Sore throat?: No Sinus problems?: No  Hematologic/Lymphatic Swollen glands?: No Easy bruising?: No  Cardiovascular Leg swelling?: No Chest pain?: No  Respiratory Cough?: No Shortness of breath?: No  Endocrine Excessive thirst?: No  Musculoskeletal Back pain?: Yes Joint pain?: No  Neurological Headaches?: No Dizziness?: No  Psychologic Depression?: No Anxiety?: No  Physical Exam: BP 117/80   Pulse 69   Resp 16   Ht 6\' 1"  (1.854 m)   Wt 223 lb 6.4 oz (101.3 kg)   SpO2 98%   BMI 29.47 kg/m    Constitutional:  Alert and oriented, No acute distress. HEENT: [place] AT, moist mucus membranes.  Trachea midline, no masses. Cardiovascular: No clubbing, cyanosis, or edema. Respiratory: Normal respiratory effort, no increased work of breathing. GI: Abdomen is soft, nontender, nondistended, no abdominal masses GU: No CVA tenderness.  Prostate 50 g, smooth without nodules Lymph: No cervical or inguinal lymphadenopathy. Skin: No rashes, bruises or suspicious lesions. Neurologic: Grossly intact, no focal deficits, moving all 4 extremities. Psychiatric: Normal mood and affect.  Laboratory Data: Lab Results  Component Value Date   WBC 7.4 12/23/2016   HGB 14.4 12/23/2016   HCT 42.7 12/23/2016   MCV 90 12/23/2016   PLT 348 12/23/2016    Lab Results  Component Value Date   CREATININE 0.89 12/23/2016    Assessment & Plan:   67 year old male with stable PSA and benign DRE.  Recommend continued monitoring with a follow-up PSA only in 6 months and PSA/DRE in 1 year.     Abbie Sons, [place] [address], [place], [place] [address] [phone]

## 2017-11-24 ENCOUNTER — Ambulatory Visit: Payer: PPO

## 2017-12-01 ENCOUNTER — Ambulatory Visit (INDEPENDENT_AMBULATORY_CARE_PROVIDER_SITE_OTHER): Payer: PPO

## 2017-12-01 VITALS — BP 110/64 | HR 60 | Temp 98.2°F | Ht 73.0 in | Wt 224.8 lb

## 2017-12-01 DIAGNOSIS — Z0000 Encounter for general adult medical examination without abnormal findings: Secondary | ICD-10-CM | POA: Diagnosis not present

## 2017-12-01 NOTE — Patient Instructions (Addendum)
Derek Jackson , Thank you for taking time to come for your Medicare Wellness Visit. I appreciate your ongoing commitment to your health goals. Please review the following plan we discussed and let me know if I can assist you in the future.   Screening recommendations/referrals: Colonoscopy: Up to date Recommended yearly ophthalmology/optometry visit for glaucoma screening and checkup Recommended yearly dental visit for hygiene and checkup  Vaccinations: Influenza vaccine: N/A Pneumococcal vaccine: Up to date Tdap vaccine: Pt declines today.  Shingles vaccine: Pt declines today.     Advanced directives: Please bring a copy of your POA (Power of Attorney) and/or Living Will to your next appointment.   Conditions/risks identified: Recommend increasing water intake to 4 glasses a day.   Next appointment: 12/29/17 @ 9 AM with Dr Caryn Section.   Preventive Care 62 Years and Older, Male Preventive care refers to lifestyle choices and visits with your health care provider that can promote health and wellness. What does preventive care include?  A yearly physical exam. This is also called an annual well check.  Dental exams once or twice a year.  Routine eye exams. Ask your health care provider how often you should have your eyes checked.  Personal lifestyle choices, including:  Daily care of your teeth and gums.  Regular physical activity.  Eating a healthy diet.  Avoiding tobacco and drug use.  Limiting alcohol use.  Practicing safe sex.  Taking low doses of aspirin every day.  Taking vitamin and mineral supplements as recommended by your health care provider. What happens during an annual well check? The services and screenings done by your health care provider during your annual well check will depend on your age, overall health, lifestyle risk factors, and family history of disease. Counseling  Your health care provider may ask you questions about your:  Alcohol use.  Tobacco  use.  Drug use.  Emotional well-being.  Home and relationship well-being.  Sexual activity.  Eating habits.  History of falls.  Memory and ability to understand (cognition).  Work and work Statistician. Screening  You may have the following tests or measurements:  Height, weight, and BMI.  Blood pressure.  Lipid and cholesterol levels. These may be checked every 5 years, or more frequently if you are over 29 years old.  Skin check.  Lung cancer screening. You may have this screening every year starting at age 42 if you have a 30-pack-year history of smoking and currently smoke or have quit within the past 15 years.  Fecal occult blood test (FOBT) of the stool. You may have this test every year starting at age 18.  Flexible sigmoidoscopy or colonoscopy. You may have a sigmoidoscopy every 5 years or a colonoscopy every 10 years starting at age 62.  Prostate cancer screening. Recommendations will vary depending on your family history and other risks.  Hepatitis C blood test.  Hepatitis B blood test.  Sexually transmitted disease (STD) testing.  Diabetes screening. This is done by checking your blood sugar (glucose) after you have not eaten for a while (fasting). You may have this done every 1-3 years.  Abdominal aortic aneurysm (AAA) screening. You may need this if you are a current or former smoker.  Osteoporosis. You may be screened starting at age 57 if you are at high risk. Talk with your health care provider about your test results, treatment options, and if necessary, the need for more tests. Vaccines  Your health care provider may recommend certain vaccines, such as:  Influenza vaccine. This is recommended every year.  Tetanus, diphtheria, and acellular pertussis (Tdap, Td) vaccine. You may need a Td booster every 10 years.  Zoster vaccine. You may need this after age 17.  Pneumococcal 13-valent conjugate (PCV13) vaccine. One dose is recommended after age  66.  Pneumococcal polysaccharide (PPSV23) vaccine. One dose is recommended after age 72. Talk to your health care provider about which screenings and vaccines you need and how often you need them. This information is not intended to replace advice given to you by your health care provider. Make sure you discuss any questions you have with your health care provider. Document Released: 09/08/2015 Document Revised: 05/01/2016 Document Reviewed: 06/13/2015 Elsevier Interactive Patient Education  2017 [place] Prevention in the Home Falls can cause injuries. They can happen to people of all ages. There are many things you can do to make your home safe and to help prevent falls. What can I do on the outside of my home?  Regularly fix the edges of walkways and driveways and fix any cracks.  Remove anything that might make you trip as you walk through a door, such as a raised step or threshold.  Trim any bushes or trees on the path to your home.  Use bright outdoor lighting.  Clear any walking paths of anything that might make someone trip, such as rocks or tools.  Regularly check to see if handrails are loose or broken. Make sure that both sides of any steps have handrails.  Any raised decks and porches should have guardrails on the edges.  Have any leaves, snow, or ice cleared regularly.  Use sand or salt on walking paths during winter.  Clean up any spills in your garage right away. This includes oil or grease spills. What can I do in the bathroom?  Use night lights.  Install grab bars by the toilet and in the tub and shower. Do not use towel bars as grab bars.  Use non-skid mats or decals in the tub or shower.  If you need to sit down in the shower, use a plastic, non-slip stool.  Keep the floor dry. Clean up any water that spills on the floor as soon as it happens.  Remove soap buildup in the tub or shower regularly.  Attach bath mats securely with double-sided  non-slip rug tape.  Do not have throw rugs and other things on the floor that can make you trip. What can I do in the bedroom?  Use night lights.  Make sure that you have a light by your bed that is easy to reach.  Do not use any sheets or blankets that are too big for your bed. They should not hang down onto the floor.  Have a firm chair that has side arms. You can use this for support while you get dressed.  Do not have throw rugs and other things on the floor that can make you trip. What can I do in the kitchen?  Clean up any spills right away.  Avoid walking on wet floors.  Keep items that you use a lot in easy-to-reach places.  If you need to reach something above you, use a strong step stool that has a grab bar.  Keep electrical cords out of the way.  Do not use floor polish or wax that makes floors slippery. If you must use wax, use non-skid floor wax.  Do not have throw rugs and other things on the floor that  can make you trip. What can I do with my stairs?  Do not leave any items on the stairs.  Make sure that there are handrails on both sides of the stairs and use them. Fix handrails that are broken or loose. Make sure that handrails are as long as the stairways.  Check any carpeting to make sure that it is firmly attached to the stairs. Fix any carpet that is loose or worn.  Avoid having throw rugs at the top or bottom of the stairs. If you do have throw rugs, attach them to the floor with carpet tape.  Make sure that you have a light switch at the top of the stairs and the bottom of the stairs. If you do not have them, ask someone to add them for you. What else can I do to help prevent falls?  Wear shoes that:  Do not have high heels.  Have rubber bottoms.  Are comfortable and fit you well.  Are closed at the toe. Do not wear sandals.  If you use a stepladder:  Make sure that it is fully opened. Do not climb a closed stepladder.  Make sure that both  sides of the stepladder are locked into place.  Ask someone to hold it for you, if possible.  Clearly mark and make sure that you can see:  Any grab bars or handrails.  First and last steps.  Where the edge of each step is.  Use tools that help you move around (mobility aids) if they are needed. These include:  Canes.  Walkers.  Scooters.  Crutches.  Turn on the lights when you go into a dark area. Replace any light bulbs as soon as they burn out.  Set up your furniture so you have a clear path. Avoid moving your furniture around.  If any of your floors are uneven, fix them.  If there are any pets around you, be aware of where they are.  Review your medicines with your doctor. Some medicines can make you feel dizzy. This can increase your chance of falling. Ask your doctor what other things that you can do to help prevent falls. This information is not intended to replace advice given to you by your health care provider. Make sure you discuss any questions you have with your health care provider. Document Released: 06/08/2009 Document Revised: 01/18/2016 Document Reviewed: 09/16/2014 Elsevier Interactive Patient Education  2017 [employer].

## 2017-12-01 NOTE — Progress Notes (Signed)
Subjective:   Derek Jackson is a 67 y.o. male who presents for Medicare Annual/Subsequent preventive examination.  Review of Systems:  N/A  Cardiac Risk Factors include: advanced age (>22men, >22 women);dyslipidemia;hypertension;male gender     Objective:    Vitals: BP 110/64 (BP Location: Left Arm)   Pulse 60   Temp 98.2 F (36.8 C) (Oral)   Ht 6\' 1"  (1.854 m)   Wt 224 lb 12.8 oz (102 kg)   BMI 29.66 kg/m   Body mass index is 29.66 kg/m.  Advanced Directives 12/01/2017 04/16/2017 12/18/2016  Does Patient Have a Medical Advance Directive? Yes Yes Yes  Type of Paramedic of [place];Living will Living will [place];Living will  Copy of [place] in Chart? No - copy requested - No - copy requested    Tobacco Social History   Tobacco Use  Smoking Status Former Smoker  . Packs/day: 1.00  . Years: 8.00  . Pack years: 8.00  . Types: Cigarettes  . Last attempt to quit: 04/26/1990  . Years since quitting: 27.6  Smokeless Tobacco Never Used     Counseling given: Not Answered   Clinical Intake:  Pre-visit preparation completed: Yes  Pain : No/denies pain Pain Score: 0-No pain     Nutritional Status: BMI 25 -29 Overweight Nutritional Risks: None Diabetes: No  How often do you need to have someone help you when you read instructions, pamphlets, or other written materials from your doctor or pharmacy?: 1 - Never  Interpreter Needed?: No  Information entered by :: [facility], LPN  Past Medical History:  Diagnosis Date  . BPH (benign prostatic hypertrophy)   . Esophagitis, reflux   . Kidney stone   . Rosacea   . Systolic CHF ([place]) 3329   Past Surgical History:  Procedure Laterality Date  . Abdominal wall mass excision  2010   Dr. Bary Castilla  . CARDIAC CATHETERIZATION  07/2014   No blockages. per patient one side of heart was not functioning well, CHF-Dr. Nehemiah Massed  . CHOLECYSTECTOMY  2005   Lapraroscopic  . COLONOSCOPY  [id number]  . COLONOSCOPY WITH PROPOFOL N/A 04/16/2017   Procedure: COLONOSCOPY WITH PROPOFOL;  Surgeon: Robert Bellow, MD;  Location: [facility] ENDOSCOPY;  Service: Endoscopy;  Laterality: N/A;  . CT Scan of Abdomen  12/05/2008   Soft Tissue mass 2.42cm x 3.98 cm of Abdominal wall lateral to umbilicus, pathology shows fibromatosis. Multiple large liver cysts up to 9cm x 9cm  . KNEE SURGERY  1994   Arthroscopy. Surgery by Dr. Marry Guan  . RIGHT COLECTOMY  2006   Dr.Byrnett  . TONSILLECTOMY AND ADENOIDECTOMY  1960's  . UPPER GI ENDOSCOPY  2010   Family History  Problem Relation Age of Onset  . Colon polyps Father   . Diabetes Father   . CAD Father        had CABG  . Stroke Father   . Dementia Father   . Congestive Heart Failure Mother   . Heart attack Maternal Grandfather   . Diabetes Paternal Grandmother   . Heart attack Paternal Grandfather   . Heart disease Brother        also had history of VSD  . Aneurysm Brother   . Prostate cancer Neg Hx   . Bladder Cancer Neg Hx   . Kidney cancer Neg Hx    Social History   Socioeconomic History  . Marital status: Single    Spouse name: Not on file  .  Number of children: 3  . Years of education: Not on file  . Highest education level: Some college, no degree  Occupational History  . Occupation: Employed    Comment: Works at [employer]: part time  [place]  . Financial resource strain: Not hard at all  . Food insecurity:    Worry: Never true    Inability: Never true  . Transportation needs:    Medical: No    Non-medical: No  Tobacco Use  . Smoking status: Former Smoker    Packs/day: 1.00    Years: 8.00    Pack years: 8.00    Types: Cigarettes    Last attempt to quit: 04/26/1990    Years since quitting: 27.6  . Smokeless tobacco: Never Used  Substance and Sexual Activity  . Alcohol use: Yes    Alcohol/week: 1.8 - 2.4 oz    Types: 3 - 4 Cans of beer per week     Comment: socially  . Drug use: No  . Sexual activity: Not on file  Lifestyle  . Physical activity:    Days per week: Not on file    Minutes per session: Not on file  . Stress: Only a little  Relationships  . Social connections:    Talks on phone: Not on file    Gets together: Not on file    Attends religious service: Not on file    Active member of club or organization: Not on file    Attends meetings of clubs or organizations: Not on file    Relationship status: Not on file  Other Topics Concern  . Not on file  Social History Narrative   Retired April 2017, still working 3 days a week    Outpatient Encounter Medications as of 12/01/2017  Medication Sig  . Ascorbic Acid (VITAMIN C) 100 MG tablet Take by mouth 3 (three) times a week.  . ferrous sulfate 325 (65 FE) MG tablet Take 325 mg by mouth 3 (three) times a week.  Marland Kitchen lisinopril (PRINIVIL,ZESTRIL) 20 MG tablet Take 1 tablet by mouth daily.  . metoprolol tartrate (LOPRESSOR) 25 MG tablet Take 1 tablet by mouth 2 (two) times daily.  . Multiple Vitamin (MULTIVITAMIN) capsule Take 1 capsule by mouth daily.  . naproxen sodium (ALEVE) 220 MG tablet Take 220 mg by mouth daily as needed.  . ranitidine (ZANTAC) 300 MG tablet TAKE 1 TABLET BY MOUTH TWICE A DAY  . spironolactone (ALDACTONE) 25 MG tablet Take 12.5 mg by mouth daily.   . tamsulosin (FLOMAX) 0.4 MG CAPS capsule Take 0.4 mg by mouth.   No facility-administered encounter medications on file as of 12/01/2017.     Activities of Daily Living In your present state of health, do you have any difficulty performing the following activities: 12/01/2017 12/18/2016  Hearing? N N  Vision? N N  Difficulty concentrating or making decisions? N N  Walking or climbing stairs? Y N  Comment Due to SOB. -  Dressing or bathing? N N  Doing errands, shopping? N N  Preparing Food and eating ? N N  Using the Toilet? N N  In the past six months, have you accidently leaked urine? N N  Do you have  problems with loss of bowel control? N N  Managing your Medications? N N  Managing your Finances? N N  Housekeeping or managing your Housekeeping? N N  Some recent data might be hidden    Patient Care Team:  Birdie Sons, MD as PCP - General (Family Medicine) Byrnett, [name], MD (General Surgery) Barbette Merino, NP as Nurse Practitioner (Nurse Practitioner) Abbie Sons, MD as Consulting Physician (Urology)   Assessment:   This is a routine wellness examination for Derek Jackson.  Exercise Activities and Dietary recommendations Current Exercise Habits: Home exercise routine, Type of exercise: walking;stretching, Time (Minutes): 45, Frequency (Times/Week): 6, Weekly Exercise (Minutes/Week): 270, Intensity: Mild, Exercise limited by: None identified  Goals    . DIET - INCREASE WATER INTAKE     Recommend increasing water intake to 4 glasses a day.        Fall Risk Fall Risk  12/01/2017 12/18/2016 12/18/2015  Falls in the past year? No No No   Is the patient's home free of loose throw rugs in walkways, pet beds, electrical cords, etc?   yes      Grab bars in the bathroom? no      Handrails on the stairs?  yes      Adequate lighting?   yes  Timed Get Up and Go Performed: N/A  Depression Screen PHQ 2/9 Scores 12/01/2017 12/18/2016 12/18/2015  PHQ - 2 Score 1 5 2   PHQ- 9 Score - 8 4    Cognitive Function: Pt declined screening today.      6CIT Screen 12/18/2016  What Year? 0 points  What month? 0 points  What time? 0 points  Count back from 20 0 points  Months in reverse 0 points  Repeat phrase 4 points  Total Score 4    Immunization History  Administered Date(s) Administered  . H1N1 07/13/2008  . Pneumococcal Conjugate-13 12/18/2015  . Pneumococcal Polysaccharide-23 12/23/2016  . Td 03/21/1999, 11/25/2003  . Zoster 10/22/2010    Qualifies for Shingles Vaccine? Due for Shingles vaccine. Declined my offer to administer today. Education has been provided  regarding the importance of this vaccine. Pt has been advised to call her insurance company to determine her out of pocket expense. Advised she may also receive this vaccine at her local pharmacy or Health Dept. Verbalized acceptance and understanding.  Screening Tests Health Maintenance  Topic Date Due  . TETANUS/TDAP  11/24/2013  . INFLUENZA VACCINE  03/26/2018  . COLONOSCOPY  04/16/2022  . Hepatitis C Screening  Completed  . PNA vac Low Risk Adult  Completed   Cancer Screenings: Lung: Low Dose CT Chest recommended if Age 15-80 years, 30 pack-year currently smoking OR have quit w/in 15years. Patient does not qualify. Colorectal: Up to date  Additional Screenings:  Hepatitis C Screening: Up to date      Plan:  I have personally reviewed and addressed the Medicare Annual Wellness questionnaire and have noted the following in the patient's chart:  A. Medical and social history B. Use of alcohol, tobacco or illicit drugs  C. Current medications and supplements D. Functional ability and status E.  Nutritional status F.  Physical activity G. Advance directives H. List of other physicians I.  Hospitalizations, surgeries, and ER visits in previous 12 months J.  [place] such as hearing and vision if needed, cognitive and depression L. Referrals and appointments - none  In addition, I have reviewed and discussed with patient certain preventive protocols, quality metrics, and best practice recommendations. A written personalized care plan for preventive services as well as general preventive health recommendations were provided to patient.  See attached scanned questionnaire for additional information.   Signed,  Fabio Neighbors, LPN Nurse Health Advisor  Nurse Recommendations: Pt declined the tetanus vaccine today.

## 2017-12-29 ENCOUNTER — Ambulatory Visit (INDEPENDENT_AMBULATORY_CARE_PROVIDER_SITE_OTHER): Payer: PPO | Admitting: Family Medicine

## 2017-12-29 ENCOUNTER — Encounter: Payer: Self-pay | Admitting: Family Medicine

## 2017-12-29 VITALS — BP 116/70 | HR 60 | Temp 98.4°F | Resp 16 | Ht 73.0 in | Wt 219.0 lb

## 2017-12-29 DIAGNOSIS — R5383 Other fatigue: Secondary | ICD-10-CM

## 2017-12-29 DIAGNOSIS — Z0000 Encounter for general adult medical examination without abnormal findings: Secondary | ICD-10-CM | POA: Diagnosis not present

## 2017-12-29 DIAGNOSIS — M792 Neuralgia and neuritis, unspecified: Secondary | ICD-10-CM | POA: Diagnosis not present

## 2017-12-29 DIAGNOSIS — E782 Mixed hyperlipidemia: Secondary | ICD-10-CM | POA: Diagnosis not present

## 2017-12-29 NOTE — Patient Instructions (Signed)
   The CDC recommends two doses of Shingrix (the shingles vaccine) separated by 2 to 6 months for adults age 68 years and older. I recommend checking with your insurance plan regarding coverage for this vaccine.   

## 2017-12-29 NOTE — Progress Notes (Signed)
Patient: Derek Jackson, Male    DOB: Jan 16, 1951, 67 y.o.   MRN: [id number] Visit Date: 12/29/2017  Today's Provider: Lelon Huh, MD   Chief Complaint  Patient presents with  . Annual Exam   Subjective:     Complete Physical Derek Jackson is a 67 y.o. male. He feels well. He reports exercising regularly. He reports he is sleeping well. He does feel a little more fatigued over the last several month.  Also states that he gets occasional pain in left sided neck pain that feeling like a shooting pain and last seconds to minutes.  -----------------------------------------------------------   Review of Systems  Constitutional: Positive for appetite change and fatigue. Negative for activity change, chills, diaphoresis, fever and unexpected weight change.  HENT: Positive for tinnitus. Negative for congestion, dental problem, drooling, ear discharge, ear pain, facial swelling, hearing loss, mouth sores, nosebleeds, postnasal drip, rhinorrhea, sinus pressure, sinus pain, sneezing, sore throat, trouble swallowing and voice change.   Eyes: Negative.   Respiratory: Positive for shortness of breath. Negative for apnea, cough, choking, chest tightness, wheezing and stridor.   Cardiovascular: Negative.   Gastrointestinal: Negative.   Endocrine: Negative.   Genitourinary: Positive for frequency. Negative for decreased urine volume, difficulty urinating, discharge, dysuria, enuresis, flank pain, genital sores, hematuria, penile pain, penile swelling, scrotal swelling, testicular pain and urgency.  Musculoskeletal: Positive for arthralgias, back pain, myalgias and neck stiffness. Negative for gait problem, joint swelling and neck pain.  Skin: Positive for wound. Negative for color change, pallor and rash.  Allergic/Immunologic: Negative.   Neurological: Positive for dizziness, light-headedness and headaches. Negative for tremors, seizures, syncope, facial asymmetry, speech difficulty,  weakness and numbness.  Hematological: Negative for adenopathy. Bruises/bleeds easily.  Psychiatric/Behavioral: Negative for agitation, behavioral problems, confusion, decreased concentration, dysphoric mood, hallucinations, self-injury, sleep disturbance and suicidal ideas. The patient is nervous/anxious. The patient is not hyperactive.     Social History   Socioeconomic History  . Marital status: Single    Spouse name: Not on file  . Number of children: 3  . Years of education: Not on file  . Highest education level: Some college, no degree  Occupational History  . Occupation: Employed    Comment: Works at [employer]: part time  [place]  . Financial resource strain: Not hard at all  . Food insecurity:    Worry: Never true    Inability: Never true  . Transportation needs:    Medical: No    Non-medical: No  Tobacco Use  . Smoking status: Former Smoker    Packs/day: 1.00    Years: 8.00    Pack years: 8.00    Types: Cigarettes    Last attempt to quit: 04/26/1990    Years since quitting: 27.6  . Smokeless tobacco: Never Used  Substance and Sexual Activity  . Alcohol use: Yes    Alcohol/week: 1.8 - 2.4 oz    Types: 3 - 4 Cans of beer per week    Comment: socially  . Drug use: No  . Sexual activity: Not on file  Lifestyle  . Physical activity:    Days per week: Not on file    Minutes per session: Not on file  . Stress: Only a little  Relationships  . Social connections:    Talks on phone: Not on file    Gets together: Not on file    Attends religious service: Not on file  Active member of club or organization: Not on file    Attends meetings of clubs or organizations: Not on file    Relationship status: Not on file  . Intimate partner violence:    Fear of current or ex partner: Not on file    Emotionally abused: Not on file    Physically abused: Not on file    Forced sexual activity: Not on file  Other Topics Concern  . Not on  file  Social History Narrative   Retired April 2017, still working 3 days a week    Past Medical History:  Diagnosis Date  . BPH (benign prostatic hypertrophy)   . Esophagitis, reflux   . Kidney stone   . Rosacea   . Systolic CHF [facility]) 6073     Patient Active Problem List   Diagnosis Date Noted  . Elevated PSA 03/18/2017  . BPH (benign prostatic hyperplasia) 04/27/2015  . Chest pain 04/27/2015  . Dyspnea on exertion 04/27/2015  . Fatigue 04/27/2015  . History of colon cancer 04/27/2015  . History of shingles 04/27/2015  . History of kidney stones 04/27/2015  . Systolic CHF ([place]) 71/01/2693  . Tinnitus 04/27/2015  . Benign essential hypertension 12/12/2014  . Hyperlipemia, mixed 07/25/2014  . Stricture and stenosis of esophagus 05/10/2009  . Fam hx-ischem heart disease 07/13/2008  . Dermatophytosis, nail 05/03/2007  . Adjustment disorder with mixed anxiety and depressed mood 09/09/2006  . Esophagitis, reflux 06/20/2006  . Rosacea 06/20/2006    Past Surgical History:  Procedure Laterality Date  . Abdominal wall mass excision  2010   Dr. Bary Castilla  . CARDIAC CATHETERIZATION  07/2014   No blockages. per patient one side of heart was not functioning well, CHF-Dr. Nehemiah Massed  . CHOLECYSTECTOMY  2005   Lapraroscopic  . COLONOSCOPY  [id number]  . COLONOSCOPY WITH PROPOFOL N/A 04/16/2017   Procedure: COLONOSCOPY WITH PROPOFOL;  Surgeon: Robert Bellow, MD;  Location[facility] END[facility]COPY;  Service: Endoscopy;  Laterality: N/A;  . CT Scan of Abdomen  12/05/2008   Soft Tissue mass 2.42cm x 3.98 cm of Abdominal wall lateral to umbilicus, pathology shows fibromatosis. Multiple large liver cysts up to 9cm x 9cm  . KNEE SURGERY  1994   Arthroscopy. Surgery by Dr. Marry Guan  . RIGHT COLECTOMY  2006   Dr.Byrnett  . TONSILLECTOMY AND ADENOIDECTOMY  1960's  . UPPER GI ENDOSCOPY  2010    His family history includes Aneurysm in his brother; CAD in his father; Colon polyps in his father;  Congestive Heart Failure in his mother; Dementia in his father; Diabetes in his father and paternal grandmother; Heart attack in his maternal grandfather and paternal grandfather; Heart disease in his brother; Stroke in his father. There is no history of Prostate cancer, Bladder Cancer, or Kidney cancer.      Current Outpatient Medications:  .  Ascorbic Acid (VITAMIN C) 100 MG tablet, Take by mouth 3 (three) times a week., Disp: , Rfl:  .  ferrous sulfate 325 (65 FE) MG tablet, Take 325 mg by mouth 3 (three) times a week., Disp: , Rfl:  .  lisinopril (PRINIVIL,ZESTRIL) 20 MG tablet, Take 1 tablet by mouth daily., Disp: , Rfl:  .  metoprolol tartrate (LOPRESSOR) 25 MG tablet, Take 1 tablet by mouth 2 (two) times daily., Disp: , Rfl:  .  Multiple Vitamin (MULTIVITAMIN) capsule, Take 1 capsule by mouth daily., Disp: , Rfl:  .  naproxen sodium (ALEVE) 220 MG tablet, Take 220 mg by mouth daily as  needed., Disp: , Rfl:  .  ranitidine (ZANTAC) 300 MG tablet, TAKE 1 TABLET BY MOUTH TWICE A DAY, Disp: 180 tablet, Rfl: 4 .  spironolactone (ALDACTONE) 25 MG tablet, Take 12.5 mg by mouth daily. , Disp: , Rfl:  .  tamsulosin (FLOMAX) 0.4 MG CAPS capsule, Take 0.4 mg by mouth., Disp: , Rfl:   Patient Care Team: Birdie Sons, MD as PCP - General (Family Medicine) Bary Castilla, [name], MD (General Surgery) Barbette Merino, NP as Nurse Practitioner (Nurse Practitioner) Abbie Sons, MD as Consulting Physician (Urology)     Objective:   Vitals: BP 116/70 (BP Location: Right Arm, Patient Position: Sitting, Cuff Size: Normal)   Pulse 60   Temp 98.4 F (36.9 C) (Oral)   Resp 16   Ht 6\' 1"  (1.854 m)   Wt 219 lb (99.3 kg)   BMI 28.89 kg/m   Physical Exam   General Appearance:    Alert, cooperative, no distress, appears stated age  Head:    Normocephalic, without obvious abnormality, atraumatic  Eyes:    PERRL, conjunctiva/corneas clear, EOM's intact, fundi    benign, both eyes       Ears:     Normal TM's and external ear canals, both ears  Nose:   Nares normal, septum midline, mucosa normal, no drainage   or sinus tenderness  Throat:   Lips, mucosa, and tongue normal; teeth and gums normal  Neck:   Supple, symmetrical, trachea midline, no adenopathy;       thyroid:  No enlargement/tenderness/nodules; no carotid   bruit or JVD  Back:     Symmetric, no curvature, ROM normal, no CVA tenderness  Lungs:     Clear to auscultation bilaterally, respirations unlabored  Chest wall:    No tenderness or deformity  Heart:    Regular rate and rhythm, S1 and S2 normal, no murmur, rub   or gallop  Abdomen:     Soft, non-tender, bowel sounds active all four quadrants,    no masses, no organomegaly  Genitalia:    deferred  Rectal:    deferred  Extremities:   Extremities normal, atraumatic, no cyanosis or edema  Pulses:   2+ and symmetric all extremities  Skin:   Skin color, texture, turgor normal, no rashes or lesions  Lymph nodes:   Cervical, supraclavicular, and axillary nodes normal  Neurologic:   CNII-XII intact. Normal strength, sensation and reflexes      throughout    Activities of Daily Living In your present state of health, do you have any difficulty performing the following activities: 12/01/2017  Hearing? N  Vision? N  Difficulty concentrating or making decisions? N  Walking or climbing stairs? Y  Comment Due to SOB.  Dressing or bathing? N  Doing errands, shopping? N  Preparing Food and eating ? N  Using the Toilet? N  In the past six months, have you accidently leaked urine? N  Do you have problems with loss of bowel control? N  Managing your Medications? N  Managing your Finances? N  Housekeeping or managing your Housekeeping? N  Some recent data might be hidden    Fall Risk Assessment Fall Risk  12/01/2017 12/18/2016 12/18/2015  Falls in the past year? No No No     Depression Screen PHQ 2/9 Scores 12/01/2017 12/18/2016 12/18/2015  PHQ - 2 Score 1 5 2   PHQ- 9 Score  - 8 4        Assessment & Plan:  Annual Physical Reviewed patient's Family Medical History Reviewed and updated list of patient's medical providers Assessment of cognitive impairment was done Assessed patient's functional ability Established a written schedule for health screening [place] Completed and Reviewed  Exercise Activities and Dietary recommendations Goals    . DIET - INCREASE WATER INTAKE     Recommend increasing water intake to 4 glasses a day.        Immunization History  Administered Date(s) Administered  . H1N1 07/13/2008  . Pneumococcal Conjugate-13 12/18/2015  . Pneumococcal Polysaccharide-23 12/23/2016  . Td 03/21/1999, 11/25/2003  . Zoster 10/22/2010    Health Maintenance  Topic Date Due  . TETANUS/TDAP  11/24/2013  . INFLUENZA VACCINE  03/26/2018  . COLONOSCOPY  04/16/2022  . Hepatitis C Screening  Completed  . PNA vac Low Risk Adult  Completed     Discussed health benefits of physical activity, and encouraged him to engage in regular exercise appropriate for his age and condition.    ------------------------------------------------------------------------------------------------------------  1. Annual physical exam Generally doing well. Is having a bit of dizziness. Advised him to discuss adjusting BP medications with his cardiologist if labs are normal.   2. Other fatigue  - Comprehensive metabolic panel - CBC - TSH  3. Neuralgia Not bothering very frequently. Advised there are some  Prophylactic medications, but I don't think it is worth potential side effects at this point.    Lelon Huh, MD  [facility]

## 2017-12-30 LAB — COMPREHENSIVE METABOLIC PANEL
ALK PHOS: 105 IU/L (ref 39–117)
ALT: 42 IU/L (ref 0–44)
AST: 28 IU/L (ref 0–40)
Albumin/Globulin Ratio: 2.4 — ABNORMAL HIGH (ref 1.2–2.2)
Albumin: 4.4 g/dL (ref 3.6–4.8)
BILIRUBIN TOTAL: 0.6 mg/dL (ref 0.0–1.2)
BUN/Creatinine Ratio: 22 (ref 10–24)
BUN: 20 mg/dL (ref 8–27)
CHLORIDE: 103 mmol/L (ref 96–106)
CO2: 21 mmol/L (ref 20–29)
CREATININE: 0.91 mg/dL (ref 0.76–1.27)
Calcium: 9.7 mg/dL (ref 8.6–10.2)
GFR calc Af Amer: 100 mL/min/{1.73_m2} (ref >59)
GFR calc non Af Amer: 87 mL/min/{1.73_m2} (ref >59)
GLUCOSE: 115 mg/dL — AB (ref 65–99)
Globulin, Total: 1.8 g/dL (ref 1.5–4.5)
Potassium: 4.9 mmol/L (ref 3.5–5.2)
SODIUM: 138 mmol/L (ref 134–144)
Total Protein: 6.2 g/dL (ref 6.0–8.5)

## 2017-12-30 LAB — CBC
Hematocrit: 44.5 % (ref 37.5–51.0)
Hemoglobin: 14.5 g/dL (ref 13.0–17.7)
MCH: 29.8 pg (ref 26.6–33.0)
MCHC: 32.6 g/dL (ref 31.5–35.7)
MCV: 92 fL (ref 79–97)
PLATELETS: 311 10*3/uL (ref 150–379)
RBC: 4.86 x10E6/uL (ref 4.14–5.80)
RDW: 14 % (ref 12.3–15.4)
WBC: 6 10*3/uL (ref 3.4–10.8)

## 2017-12-30 LAB — TSH: TSH: 1.55 u[IU]/mL (ref 0.450–4.500)

## 2017-12-31 ENCOUNTER — Telehealth: Payer: Self-pay | Admitting: *Deleted

## 2017-12-31 ENCOUNTER — Encounter: Payer: Self-pay | Admitting: Family Medicine

## 2017-12-31 MED ORDER — ATORVASTATIN CALCIUM 40 MG PO TABS: 40.0000 mg | ORAL_TABLET | Freq: Every day | ORAL | 2 refills | Status: DC

## 2017-12-31 NOTE — Telephone Encounter (Signed)
Patient was notified of results. Expressed understanding. Rx was sent to pharmacy. 

## 2017-12-31 NOTE — Telephone Encounter (Signed)
-----   Message from Birdie Sons, MD sent at 12/31/2017  7:52 AM EDT ----- LDL cholesterol is 113 and needs to be under 70 to protect heart from more damage, recommend atorvastatin 40mg  once a day, #30, rf x 5. Rest of labs are normal.  Follow up 6 months.

## 2018-01-05 LAB — SPECIMEN STATUS REPORT

## 2018-01-05 LAB — LIPID PANEL W/O CHOL/HDL RATIO
Cholesterol, Total: 188 mg/dL (ref 100–199)
HDL: 49 mg/dL (ref >39)
LDL CALC: 113 mg/dL — AB (ref 0–99)
Triglycerides: 128 mg/dL (ref 0–149)
VLDL CHOLESTEROL CAL: 26 mg/dL (ref 5–40)

## 2018-03-19 DIAGNOSIS — R0602 Shortness of breath: Secondary | ICD-10-CM | POA: Diagnosis not present

## 2018-03-19 DIAGNOSIS — E875 Hyperkalemia: Secondary | ICD-10-CM | POA: Diagnosis not present

## 2018-03-19 DIAGNOSIS — I10 Essential (primary) hypertension: Secondary | ICD-10-CM | POA: Diagnosis not present

## 2018-03-19 DIAGNOSIS — I447 Left bundle-branch block, unspecified: Secondary | ICD-10-CM | POA: Diagnosis not present

## 2018-03-19 DIAGNOSIS — E782 Mixed hyperlipidemia: Secondary | ICD-10-CM | POA: Diagnosis not present

## 2018-03-19 DIAGNOSIS — I5022 Chronic systolic (congestive) heart failure: Secondary | ICD-10-CM | POA: Diagnosis not present

## 2018-03-25 ENCOUNTER — Other Ambulatory Visit: Payer: Self-pay | Admitting: Family Medicine

## 2018-03-25 DIAGNOSIS — R3911 Hesitancy of micturition: Secondary | ICD-10-CM

## 2018-05-06 DIAGNOSIS — Z85828 Personal history of other malignant neoplasm of skin: Secondary | ICD-10-CM | POA: Diagnosis not present

## 2018-05-06 DIAGNOSIS — Z08 Encounter for follow-up examination after completed treatment for malignant neoplasm: Secondary | ICD-10-CM | POA: Diagnosis not present

## 2018-05-06 DIAGNOSIS — X32XXXA Exposure to sunlight, initial encounter: Secondary | ICD-10-CM | POA: Diagnosis not present

## 2018-05-06 DIAGNOSIS — L565 Disseminated superficial actinic porokeratosis (DSAP): Secondary | ICD-10-CM | POA: Diagnosis not present

## 2018-05-20 ENCOUNTER — Other Ambulatory Visit: Payer: PPO

## 2018-05-20 DIAGNOSIS — R972 Elevated prostate specific antigen [PSA]: Secondary | ICD-10-CM

## 2018-05-21 ENCOUNTER — Telehealth: Payer: Self-pay | Admitting: Urology

## 2018-05-21 ENCOUNTER — Telehealth: Payer: Self-pay | Admitting: Family Medicine

## 2018-05-21 LAB — PSA: PROSTATE SPECIFIC AG, SERUM: 3.7 ng/mL (ref 0.0–4.0)

## 2018-05-21 NOTE — Telephone Encounter (Signed)
LMOM lab is stable.

## 2018-05-21 NOTE — Telephone Encounter (Signed)
Pt returned call and I read message to him.

## 2018-05-21 NOTE — Telephone Encounter (Signed)
-----   Message from Abbie Sons, MD sent at 05/21/2018  7:57 AM EDT ----- PSA looks good at 3.7.  Follow-up as scheduled

## 2018-06-15 ENCOUNTER — Other Ambulatory Visit: Payer: Self-pay | Admitting: Family Medicine

## 2018-07-02 ENCOUNTER — Ambulatory Visit (INDEPENDENT_AMBULATORY_CARE_PROVIDER_SITE_OTHER): Payer: PPO | Admitting: Family Medicine

## 2018-07-02 ENCOUNTER — Encounter: Payer: Self-pay | Admitting: Family Medicine

## 2018-07-02 VITALS — BP 106/70 | HR 60 | Temp 98.0°F | Resp 16 | Ht 73.0 in | Wt 221.0 lb

## 2018-07-02 DIAGNOSIS — E782 Mixed hyperlipidemia: Secondary | ICD-10-CM | POA: Diagnosis not present

## 2018-07-02 DIAGNOSIS — I5022 Chronic systolic (congestive) heart failure: Secondary | ICD-10-CM

## 2018-07-02 NOTE — Progress Notes (Signed)
Patient: Derek Jackson Male    DOB: Dec 29, 1950   67 y.o.   MRN: [id number] Visit Date: 07/02/2018  Today's Provider: Lelon Huh, MD   Chief Complaint  Patient presents with  . Hyperlipidemia   Subjective:    HPI  Lipid/Cholesterol, Follow-up:   Last seen for this 6 months ago.  Management changes since that visit include starting Atorvastatin 40mg  daily. Patient states since the last visit, patient was seen by his Cardiologist who advised him take a 1/2 tablet of the 40mg  daily due to patient experiencing muscle pains. . Last Lipid Panel:    Component Value Date/Time   CHOL 188 12/29/2017 0941   TRIG 128 12/29/2017 0941   HDL 49 12/29/2017 0941   CHOLHDL 3.6 12/23/2016 0953   LDLCALC 113 (H) 12/29/2017 0941    Risk factors for vascular disease include hypercholesterolemia  He reports good compliance with treatment. He is not having side effects (fatigue and muscle aches) Current symptoms include none and have been stable. Weight trend: fluctuating a bit Prior visit with dietician: no Current diet: well balanced Current exercise: walking  Wt Readings from Last 3 Encounters:  07/02/18 221 lb (100.2 kg)  12/29/17 219 lb (99.3 kg)  12/01/17 224 lb 12.8 oz (102 kg)    -------------------------------------------------------------------     No Known Allergies   Current Outpatient Medications:  .  Ascorbic Acid (VITAMIN C) 100 MG tablet, Take by mouth 3 (three) times a week., Disp: , Rfl:  .  atorvastatin (LIPITOR) 40 MG tablet, Take 1 tablet (40 mg total) by mouth daily., Disp: 90 tablet, Rfl: 2 .  ferrous sulfate 325 (65 FE) MG tablet, Take 325 mg by mouth 3 (three) times a week., Disp: , Rfl:  .  lisinopril (PRINIVIL,ZESTRIL) 20 MG tablet, Take 1 tablet by mouth daily., Disp: , Rfl:  .  metoprolol tartrate (LOPRESSOR) 25 MG tablet, Take 1 tablet by mouth 2 (two) times daily., Disp: , Rfl:  .  Multiple Vitamin (MULTIVITAMIN) capsule, Take 1 capsule by  mouth daily., Disp: , Rfl:  .  naproxen sodium (ALEVE) 220 MG tablet, Take 220 mg by mouth daily as needed., Disp: , Rfl:  .  ranitidine (ZANTAC) 300 MG tablet, TAKE 1 TABLET BY MOUTH TWICE (2) DAILY, Disp: 180 tablet, Rfl: 4 .  spironolactone (ALDACTONE) 25 MG tablet, Take 12.5 mg by mouth daily. , Disp: , Rfl:  .  tamsulosin (FLOMAX) 0.4 MG CAPS capsule, TAKE 1 CAPSULE BY MOUTH ONCE DAILY, Disp: 90 capsule, Rfl: 4  Review of Systems  Constitutional: Positive for fatigue. Negative for appetite change, chills and fever.  Respiratory: Negative for chest tightness, shortness of breath and wheezing.   Cardiovascular: Negative for chest pain and palpitations.  Gastrointestinal: Negative for abdominal pain, nausea and vomiting.  Musculoskeletal: Positive for myalgias (improved).    Social History   Tobacco Use  . Smoking status: Former Smoker    Packs/day: 1.00    Years: 8.00    Pack years: 8.00    Types: Cigarettes    Last attempt to quit: 04/26/1990    Years since quitting: 28.2  . Smokeless tobacco: Never Used  Substance Use Topics  . Alcohol use: Yes    Alcohol/week: 3.0 - 4.0 standard drinks    Types: 3 - 4 Cans of beer per week    Comment: socially   Objective:   BP 106/70 (BP Location: Left Arm, Patient Position: Sitting, Cuff Size: Large)   Pulse  60   Temp 98 F (36.7 C) (Oral)   Resp 16   Ht 6\' 1"  (1.854 m)   Wt 221 lb (100.2 kg)   SpO2 98% Comment: room air  BMI 29.16 kg/m     Physical Exam   General appearance: alert, well developed, well nourished, cooperative and in no distress Head: Normocephalic, without obvious abnormality, atraumatic Respiratory: Respirations even and unlabored, normal respiratory rate Extremities: No gross deformities Skin: Skin color, texture, turgor normal. No rashes seen  Psych: Appropriate mood and affect. Neurologic: Mental status: Alert, oriented to person, place, and time, thought content appropriate.     Assessment & Plan:       1. Hyperlipemia, mixed Had some myalgias on 40mg  atorvastatin, but tolerating 20mg  well. He did have normal cardiac cath in 2015, but has strong family history of coronary artery disease. Would at least like to see LDL under 100. Consider trying adding CoQ10 to 40mg  atorvastatin if LDL is not under goal.  - Comprehensive metabolic panel - Lipid panel  2. Chronic systolic congestive heart failure (HCC) Well compensated on optimal medical management. Continue routine follow up with Dr, Nehemiah Massed.        Lelon Huh, MD  [facility]

## 2018-07-03 ENCOUNTER — Telehealth: Payer: Self-pay

## 2018-07-03 LAB — COMPREHENSIVE METABOLIC PANEL
A/G RATIO: 2.8 — AB (ref 1.2–2.2)
ALT: 42 IU/L (ref 0–44)
AST: 31 IU/L (ref 0–40)
Albumin: 4.7 g/dL (ref 3.6–4.8)
Alkaline Phosphatase: 104 IU/L (ref 39–117)
BILIRUBIN TOTAL: 0.7 mg/dL (ref 0.0–1.2)
BUN/Creatinine Ratio: 19 (ref 10–24)
BUN: 21 mg/dL (ref 8–27)
CALCIUM: 9.7 mg/dL (ref 8.6–10.2)
CHLORIDE: 99 mmol/L (ref 96–106)
CO2: 20 mmol/L (ref 20–29)
Creatinine, Ser: 1.11 mg/dL (ref 0.76–1.27)
GFR, EST AFRICAN AMERICAN: 78 mL/min/{1.73_m2} (ref >59)
GFR, EST NON AFRICAN AMERICAN: 68 mL/min/{1.73_m2} (ref >59)
Globulin, Total: 1.7 g/dL (ref 1.5–4.5)
Glucose: 109 mg/dL — ABNORMAL HIGH (ref 65–99)
POTASSIUM: 5 mmol/L (ref 3.5–5.2)
Sodium: 134 mmol/L (ref 134–144)
Total Protein: 6.4 g/dL (ref 6.0–8.5)

## 2018-07-03 LAB — LIPID PANEL
CHOL/HDL RATIO: 2.3 ratio (ref 0.0–5.0)
Cholesterol, Total: 121 mg/dL (ref 100–199)
HDL: 52 mg/dL (ref >39)
LDL Calculated: 51 mg/dL (ref 0–99)
TRIGLYCERIDES: 90 mg/dL (ref 0–149)
VLDL Cholesterol Cal: 18 mg/dL (ref 5–40)

## 2018-07-03 NOTE — Telephone Encounter (Signed)
-----   Message from Birdie Sons, MD sent at 07/03/2018  7:38 AM EST ----- LDL cholesterol is down to 51. Need to keep it at least under. Can keep taking 1/2 of 40mg  atorvastatin for now. We can change to 20mg  tablet if he gets tired of cutting them in half. Check at least once a year with CPE.

## 2018-07-03 NOTE — Telephone Encounter (Signed)
Pt advised.   Thanks,   -Laura  

## 2018-08-24 NOTE — Progress Notes (Deleted)
       Patient: Derek Jackson Male    DOB: May 03, 1951   67 y.o.   MRN: [id number] Visit Date: 08/24/2018  Today's Provider: Vernie Murders, PA   No chief complaint on file.  Subjective:     Cough  Pertinent negatives include no chest pain, chills, fever, shortness of breath or wheezing.    No Known Allergies   Current Outpatient Medications:  .  Ascorbic Acid (VITAMIN C) 100 MG tablet, Take by mouth 3 (three) times a week., Disp: , Rfl:  .  atorvastatin (LIPITOR) 40 MG tablet, Take 1 tablet (40 mg total) by mouth daily., Disp: 90 tablet, Rfl: 2 .  ferrous sulfate 325 (65 FE) MG tablet, Take 325 mg by mouth 3 (three) times a week., Disp: , Rfl:  .  lisinopril (PRINIVIL,ZESTRIL) 20 MG tablet, Take 1 tablet by mouth daily., Disp: , Rfl:  .  metoprolol tartrate (LOPRESSOR) 25 MG tablet, Take 1 tablet by mouth 2 (two) times daily., Disp: , Rfl:  .  Multiple Vitamin (MULTIVITAMIN) capsule, Take 1 capsule by mouth daily., Disp: , Rfl:  .  naproxen sodium (ALEVE) 220 MG tablet, Take 220 mg by mouth daily as needed., Disp: , Rfl:  .  ranitidine (ZANTAC) 300 MG tablet, TAKE 1 TABLET BY MOUTH TWICE (2) DAILY, Disp: 180 tablet, Rfl: 4 .  spironolactone (ALDACTONE) 25 MG tablet, Take 12.5 mg by mouth daily. , Disp: , Rfl:  .  tamsulosin (FLOMAX) 0.4 MG CAPS capsule, TAKE 1 CAPSULE BY MOUTH ONCE DAILY, Disp: 90 capsule, Rfl: 4  Review of Systems  Constitutional: Negative for appetite change, chills and fever.  HENT: Positive for congestion.   Respiratory: Positive for cough. Negative for chest tightness, shortness of breath and wheezing.   Cardiovascular: Negative for chest pain and palpitations.  Gastrointestinal: Negative for abdominal pain, nausea and vomiting.    Social History   Tobacco Use  . Smoking status: Former Smoker    Packs/day: 1.00    Years: 8.00    Pack years: 8.00    Types: Cigarettes    Last attempt to quit: 04/26/1990    Years since quitting: 28.3  . Smokeless  tobacco: Never Used  Substance Use Topics  . Alcohol use: Yes    Alcohol/week: 3.0 - 4.0 standard drinks    Types: 3 - 4 Cans of beer per week    Comment: socially      Objective:   There were no vitals taken for this visit. There were no vitals filed for this visit.   Physical Exam      Assessment & Plan        Vernie Murders, PA  [facility]

## 2018-08-25 ENCOUNTER — Ambulatory Visit: Payer: PPO | Admitting: Family Medicine

## 2018-08-27 ENCOUNTER — Encounter: Payer: Self-pay | Admitting: Family Medicine

## 2018-08-27 ENCOUNTER — Ambulatory Visit (INDEPENDENT_AMBULATORY_CARE_PROVIDER_SITE_OTHER): Payer: PPO | Admitting: Family Medicine

## 2018-08-27 VITALS — BP 116/72 | HR 57 | Temp 98.5°F | Resp 18 | Wt 232.0 lb

## 2018-08-27 DIAGNOSIS — J019 Acute sinusitis, unspecified: Secondary | ICD-10-CM

## 2018-08-27 MED ORDER — AMOXICILLIN 500 MG PO CAPS: 1000.0000 mg | ORAL_CAPSULE | Freq: Two times a day (BID) | ORAL | 0 refills | Status: AC

## 2018-08-27 NOTE — Progress Notes (Signed)
Patient: Derek Jackson Male    DOB: [birth year]   68 y.o.   MRN: [id number] Visit Date: 08/27/2018  Today's Provider: Lelon Huh, MD   Chief Complaint  Patient presents with  . URI   Subjective:     URI   This is a new problem. Episode onset: 2 weeks ago. There has been no fever. Associated symptoms include congestion, coughing (productive with green sputum), rhinorrhea, sinus pain, sneezing and a sore throat. Pertinent negatives include no abdominal pain, chest pain, ear pain, headaches, nausea, plugged ear sensation, vomiting or wheezing. Treatments tried: Mucinex and Alka-Seltzer cold. The treatment provided no relief.    No Known Allergies   Current Outpatient Medications:  .  Ascorbic Acid (VITAMIN C) 100 MG tablet, Take by mouth 3 (three) times a week., Disp: , Rfl:  .  atorvastatin (LIPITOR) 40 MG tablet, Take 1 tablet (40 mg total) by mouth daily., Disp: 90 tablet, Rfl: 2 .  ferrous sulfate 325 (65 FE) MG tablet, Take 325 mg by mouth 3 (three) times a week., Disp: , Rfl:  .  lisinopril (PRINIVIL,ZESTRIL) 20 MG tablet, Take 1 tablet by mouth daily., Disp: , Rfl:  .  metoprolol tartrate (LOPRESSOR) 25 MG tablet, Take 1 tablet by mouth 2 (two) times daily., Disp: , Rfl:  .  Multiple Vitamin (MULTIVITAMIN) capsule, Take 1 capsule by mouth daily., Disp: , Rfl:  .  ranitidine (ZANTAC) 300 MG tablet, TAKE 1 TABLET BY MOUTH TWICE (2) DAILY, Disp: 180 tablet, Rfl: 4 .  spironolactone (ALDACTONE) 25 MG tablet, Take 12.5 mg by mouth daily. , Disp: , Rfl:  .  tamsulosin (FLOMAX) 0.4 MG CAPS capsule, TAKE 1 CAPSULE BY MOUTH ONCE DAILY, Disp: 90 capsule, Rfl: 4 .  naproxen sodium (ALEVE) 220 MG tablet, Take 220 mg by mouth daily as needed., Disp: , Rfl:   Review of Systems  Constitutional: Positive for fatigue. Negative for appetite change, chills, diaphoresis and fever.  HENT: Positive for congestion, postnasal drip, rhinorrhea, sinus pressure, sinus pain, sneezing and sore  throat. Negative for ear pain.        Pain in teeth  Respiratory: Positive for cough (productive with green sputum) and shortness of breath. Negative for chest tightness and wheezing.   Cardiovascular: Negative for chest pain and palpitations.  Gastrointestinal: Negative for abdominal pain, nausea and vomiting.  Neurological: Negative for headaches.    Social History   Tobacco Use  . Smoking status: Former Smoker    Packs/day: 1.00    Years: 8.00    Pack years: 8.00    Types: Cigarettes    Last attempt to quit: 04/26/1990    Years since quitting: 28.3  . Smokeless tobacco: Never Used  Substance Use Topics  . Alcohol use: Yes    Alcohol/week: 3.0 - 4.0 standard drinks    Types: 3 - 4 Cans of beer per week    Comment: socially      Objective:   BP 116/72 (BP Location: Left Arm, Patient Position: Sitting, Cuff Size: Large)   Pulse (!) 57   Temp 98.5 F (36.9 C) (Oral)   Resp 18   Wt 232 lb (105.2 kg)   SpO2 96% Comment: room air  BMI 30.61 kg/m  Vitals:   08/27/18 0903  BP: 116/72  Pulse: (!) 57  Resp: 18  Temp: 98.5 F (36.9 C)  TempSrc: Oral  SpO2: 96%  Weight: 232 lb (105.2 kg)     Physical Exam  General Appearance:    Alert, cooperative, no distress  HENT:   ENT exam normal, no neck nodes or sinus tenderness, bilateral TM normal without fluid or infection, neck without nodes, frontal sinuses tender and nasal mucosa pale and congested  Eyes:    PERRL, conjunctiva/corneas clear, EOM's intact       Lungs:     Clear to auscultation bilaterally, respirations unlabored  Heart:    Regular rate and rhythm  Neurologic:   Awake, alert, oriented x 3. No apparent focal neurological           defect.          Assessment & Plan    1. Acute sinusitis, recurrence not specified, unspecified location  - amoxicillin (AMOXIL) 500 MG capsule; Take 2 capsules (1,000 mg total) by mouth 2 (two) times daily for 10 days.  Dispense: 40 capsule; Refill: 0  Call if symptoms  change or if not rapidly improving.      Lelon Huh, MD  [facility]

## 2018-08-28 ENCOUNTER — Ambulatory Visit: Payer: PPO | Admitting: Family Medicine

## 2018-08-29 NOTE — Patient Instructions (Signed)
.   Please bring all of your medications to every appointment so we can make sure that our medication list is the same as yours.   

## 2018-09-18 ENCOUNTER — Telehealth: Payer: Self-pay

## 2018-09-18 MED ORDER — DOXYCYCLINE HYCLATE 100 MG PO TABS: 100.0000 mg | ORAL_TABLET | Freq: Two times a day (BID) | ORAL | 0 refills | Status: DC

## 2018-09-18 NOTE — Telephone Encounter (Signed)
Pt advised.   Thanks,   -  

## 2018-09-18 NOTE — Telephone Encounter (Signed)
Have sent prescription for doxycycline to [facility]

## 2018-09-18 NOTE — Telephone Encounter (Signed)
Patient has called office to let Dr. Caryn Section know that he completed antibiotic 5 days ago for sinus infection and symptoms have not improved. Patient stats that he is still suffering with cough and congestion as well. Patient is wanting to know if we can refill antibiotic or call something else in for him? Patient uses [facility], please review chart and advise. KW

## 2018-10-02 ENCOUNTER — Ambulatory Visit
Admission: RE | Admit: 2018-10-02 | Discharge: 2018-10-02 | Disposition: A | Discharge status: Home / Self Care | Payer: PPO | Attending: Family Medicine | Admitting: Family Medicine

## 2018-10-02 ENCOUNTER — Ambulatory Visit
Admission: RE | Admit: 2018-10-02 | Discharge: 2018-10-02 | Disposition: A | Discharge status: Home / Self Care | Payer: PPO | Source: Ambulatory Visit | Attending: Family Medicine | Admitting: Family Medicine

## 2018-10-02 ENCOUNTER — Encounter: Payer: Self-pay | Admitting: Family Medicine

## 2018-10-02 ENCOUNTER — Ambulatory Visit (INDEPENDENT_AMBULATORY_CARE_PROVIDER_SITE_OTHER): Payer: PPO | Admitting: Family Medicine

## 2018-10-02 VITALS — BP 117/74 | HR 70 | Temp 98.1°F | Wt 225.0 lb

## 2018-10-02 DIAGNOSIS — K2100 Gastro-esophageal reflux disease with esophagitis, without bleeding: Secondary | ICD-10-CM

## 2018-10-02 DIAGNOSIS — R059 Cough, unspecified: Secondary | ICD-10-CM

## 2018-10-02 DIAGNOSIS — R05 Cough: Secondary | ICD-10-CM | POA: Diagnosis not present

## 2018-10-02 DIAGNOSIS — R0602 Shortness of breath: Secondary | ICD-10-CM | POA: Diagnosis not present

## 2018-10-02 MED ORDER — OMEPRAZOLE 40 MG PO CPDR: 40.0000 mg | DELAYED_RELEASE_CAPSULE | Freq: Every day | ORAL | 3 refills | Status: DC

## 2018-10-02 NOTE — Progress Notes (Signed)
Patient: Derek Jackson Male    DOB: [birth year]   68 y.o.   MRN: [id number] Visit Date: 10/02/2018  Today's Provider: Lelon Huh, MD   Chief Complaint  Patient presents with  . Sinusitis   Subjective:    Follow up Sinusitis  This is a chronic problem. Episode onset: Started about six weeks.  There has been no fever. Associated symptoms include congestion, coughing, headaches, shortness of breath, sinus pressure, sneezing and a sore throat. Pertinent negatives include no chills, diaphoresis, ear pain, hoarse voice, neck pain or swollen glands. Treatments tried: amoxicillin followed by doxycycline. States sinus pain improved considerable, but never resolved.   Cough  This is a chronic (Started about six weeks. ) problem. The cough is productive of  white sputum. Associated symptoms include chest pain, headaches, heartburn, nasal congestion, postnasal drip, rhinorrhea, a sore throat and shortness of breath. Pertinent negatives include no chills, ear congestion, ear pain, fever, hemoptysis, myalgias or wheezing. The symptoms are aggravated by lying down.   Also states he is having more trouble swallowing recently, thinks acid reflux is worsening, is taking ranitidine twice a day. Feels a little short of breath when he gets up in the morning.   Wt Readings from Last 5 Encounters:  10/02/18 225 lb (102.1 kg)  08/27/18 232 lb (105.2 kg)  07/02/18 221 lb (100.2 kg)  12/29/17 219 lb (99.3 kg)  12/01/17 224 lb 12.8 oz (102 kg)     No Known Allergies   Current Outpatient Medications:  .  Ascorbic Acid (VITAMIN C) 100 MG tablet, Take by mouth 3 (three) times a week., Disp: , Rfl:  .  atorvastatin (LIPITOR) 40 MG tablet, Take 1 tablet (40 mg total) by mouth daily., Disp: 90 tablet, Rfl: 2 .  ferrous sulfate 325 (65 FE) MG tablet, Take 325 mg by mouth 3 (three) times a week., Disp: , Rfl:  .  lisinopril (PRINIVIL,ZESTRIL) 20 MG tablet, Take 1 tablet by mouth daily., Disp: , Rfl:  .   metoprolol tartrate (LOPRESSOR) 25 MG tablet, Take 1 tablet by mouth 2 (two) times daily., Disp: , Rfl:  .  Multiple Vitamin (MULTIVITAMIN) capsule, Take 1 capsule by mouth daily., Disp: , Rfl:  .  naproxen sodium (ALEVE) 220 MG tablet, Take 220 mg by mouth daily as needed., Disp: , Rfl:  .  ranitidine (ZANTAC) 300 MG tablet, TAKE 1 TABLET BY MOUTH TWICE (2) DAILY, Disp: 180 tablet, Rfl: 4 .  spironolactone (ALDACTONE) 25 MG tablet, Take 12.5 mg by mouth daily. , Disp: , Rfl:  .  tamsulosin (FLOMAX) 0.4 MG CAPS capsule, TAKE 1 CAPSULE BY MOUTH ONCE DAILY, Disp: 90 capsule, Rfl: 4   Review of Systems  Constitutional: Positive for fatigue. Negative for chills, diaphoresis and fever.  HENT: Positive for congestion, postnasal drip, rhinorrhea, sinus pressure, sinus pain, sneezing and sore throat. Negative for ear discharge, ear pain, hoarse voice, tinnitus, trouble swallowing and voice change.   Eyes: Negative.   Respiratory: Positive for cough and shortness of breath. Negative for apnea, hemoptysis, choking, chest tightness, wheezing and stridor.   Cardiovascular: Positive for chest pain. Negative for palpitations and leg swelling.  Gastrointestinal: Positive for diarrhea, heartburn and nausea. Negative for abdominal distention, abdominal pain, anal bleeding, blood in stool, constipation, rectal pain and vomiting.  Musculoskeletal: Negative for myalgias and neck pain.  Neurological: Positive for headaches. Negative for dizziness and light-headedness.    Social History   Tobacco Use  . Smoking  status: Former Smoker    Packs/day: 1.00    Years: 8.00    Pack years: 8.00    Types: Cigarettes    Last attempt to quit: 04/26/1990    Years since quitting: 28.4  . Smokeless tobacco: Never Used  Substance Use Topics  . Alcohol use: Yes    Alcohol/week: 3.0 - 4.0 standard drinks    Types: 3 - 4 Cans of beer per week    Comment: socially      Objective:   BP 117/74 (BP Location: Right Arm,  Patient Position: Sitting, Cuff Size: Large)   Pulse 70   Temp 98.1 F (36.7 C) (Oral)   Wt 225 lb (102.1 kg)   SpO2 95%   BMI 29.69 kg/m  Vitals:   10/02/18 1453  BP: 117/74  Pulse: 70  Temp: 98.1 F (36.7 C)  TempSrc: Oral  SpO2: 95%  Weight: 225 lb (102.1 kg)    Physical Exam  General Appearance:    Alert, cooperative, no distress  HENT:   ENT exam normal, no neck nodes or sinus tenderness and nasal mucosa pale and congested  Eyes:    PERRL, conjunctiva/corneas clear, EOM's intact       Lungs:     Clear to auscultation bilaterally, respirations unlabored  Heart:    Regular rate and rhythm  Neurologic:   Awake, alert, oriented x 3. No apparent focal neurological           defect.          Assessment & Plan    1. Cough  - DG Chest 2 View; Future  2. Shortness of breath No orthopnea or PND.  - DG Chest 2 View; Future  3. Esophagitis, reflux, associated with globus sensation Change ranitidine to - omeprazole (PRILOSEC) 40 MG capsule; Take 1 capsule (40 mg total) by mouth daily.  Dispense: 30 capsule; Refill: 3  Consider GI referral if cxr is normal and sx do not resolve with change to PPI.      Lelon Huh, MD  [facility]

## 2018-10-02 NOTE — Patient Instructions (Addendum)
.   Please review the attached list of medications and notify my office if there are any errors.   . Please bring all of your medications to every appointment so we can make sure that our medication list is the same as yours.  .  . Go to the [facility] on [address] for chest Xray   

## 2018-10-05 ENCOUNTER — Telehealth: Payer: Self-pay

## 2018-10-05 NOTE — Telephone Encounter (Signed)
-----   Message from Birdie Sons, MD sent at 10/03/2018  6:42 PM EST ----- Chest xray is clear, cough is probably due to reflux. Call if not much better after being on omeprazole for 2-3 weeks.

## 2018-10-05 NOTE — Telephone Encounter (Signed)
Pt advised.   Thanks,   -Laura  

## 2018-10-06 DIAGNOSIS — I10 Essential (primary) hypertension: Secondary | ICD-10-CM | POA: Diagnosis not present

## 2018-10-06 DIAGNOSIS — R0602 Shortness of breath: Secondary | ICD-10-CM | POA: Diagnosis not present

## 2018-10-06 DIAGNOSIS — R001 Bradycardia, unspecified: Secondary | ICD-10-CM | POA: Insufficient documentation

## 2018-10-06 DIAGNOSIS — E782 Mixed hyperlipidemia: Secondary | ICD-10-CM | POA: Diagnosis not present

## 2018-10-26 ENCOUNTER — Other Ambulatory Visit: Payer: Self-pay | Admitting: Family Medicine

## 2018-10-26 DIAGNOSIS — K2100 Gastro-esophageal reflux disease with esophagitis, without bleeding: Secondary | ICD-10-CM

## 2018-10-26 DIAGNOSIS — R0602 Shortness of breath: Secondary | ICD-10-CM | POA: Diagnosis not present

## 2018-10-26 MED ORDER — OMEPRAZOLE 40 MG PO CPDR: 40.0000 mg | DELAYED_RELEASE_CAPSULE | Freq: Every day | ORAL | 3 refills | Status: DC

## 2018-10-26 NOTE — Telephone Encounter (Signed)
[facility] faxed refill request for the following medications:  omeprazole (PRILOSEC) 40 MG capsule   Patient is requesting a 90 day supply    Please advise.

## 2018-11-09 ENCOUNTER — Other Ambulatory Visit: Payer: PPO

## 2018-11-09 ENCOUNTER — Other Ambulatory Visit: Payer: Self-pay

## 2018-11-09 DIAGNOSIS — R972 Elevated prostate specific antigen [PSA]: Secondary | ICD-10-CM | POA: Diagnosis not present

## 2018-11-09 DIAGNOSIS — I447 Left bundle-branch block, unspecified: Secondary | ICD-10-CM | POA: Diagnosis not present

## 2018-11-09 DIAGNOSIS — I5022 Chronic systolic (congestive) heart failure: Secondary | ICD-10-CM | POA: Diagnosis not present

## 2018-11-09 DIAGNOSIS — E782 Mixed hyperlipidemia: Secondary | ICD-10-CM | POA: Diagnosis not present

## 2018-11-09 DIAGNOSIS — I10 Essential (primary) hypertension: Secondary | ICD-10-CM | POA: Diagnosis not present

## 2018-11-09 DIAGNOSIS — R0602 Shortness of breath: Secondary | ICD-10-CM | POA: Diagnosis not present

## 2018-11-10 LAB — PSA: PROSTATE SPECIFIC AG, SERUM: 4.3 ng/mL — AB (ref 0.0–4.0)

## 2018-11-16 ENCOUNTER — Ambulatory Visit: Payer: PPO | Admitting: Urology

## 2018-11-30 ENCOUNTER — Other Ambulatory Visit: Payer: Self-pay | Admitting: Family Medicine

## 2018-12-03 ENCOUNTER — Ambulatory Visit: Payer: Self-pay

## 2018-12-16 ENCOUNTER — Telehealth: Payer: PPO | Admitting: Urology

## 2019-02-10 ENCOUNTER — Ambulatory Visit: Payer: PPO | Admitting: Urology

## 2019-02-16 ENCOUNTER — Encounter: Payer: Self-pay | Admitting: Urology

## 2019-02-16 ENCOUNTER — Ambulatory Visit: Payer: PPO | Admitting: Urology

## 2019-02-16 ENCOUNTER — Other Ambulatory Visit: Payer: Self-pay

## 2019-02-16 VITALS — BP 115/76 | HR 72 | Ht 73.0 in | Wt 220.6 lb

## 2019-02-16 DIAGNOSIS — N401 Enlarged prostate with lower urinary tract symptoms: Secondary | ICD-10-CM

## 2019-02-16 DIAGNOSIS — R972 Elevated prostate specific antigen [PSA]: Secondary | ICD-10-CM

## 2019-02-16 NOTE — Progress Notes (Signed)
02/16/2019 8:46 AM   Derek Jackson [birth year] [id number]  Referring provider: Birdie Sons, [place] [address] [place],  [place] [address]  Chief Complaint  Patient presents with  . Elevated PSA    Urologic history: 1.  Elevated PSA  -Prostate biopsy 03/2018; PSA 4.7; volume 53 cc; benign pathology  2.  BPH with lower urinary tract symptoms  -On tamsulosin   HPI: 68 year old male presents for annual follow-up.  Overall he states he has been doing well.  He has occasional urinary frequency and baseline nocturia x1. Denies dysuria, gross hematuria or flank/abdominal/pelvic/scrotal pain.  PSA drawn March 2020 was stable at 4.3.  PMH: Past Medical History:  Diagnosis Date  . BPH (benign prostatic hypertrophy)   . Esophagitis, reflux   . Kidney stone   . Rosacea   . Systolic CHF [facility]) [id number]    Surgical History: Past Surgical History:  Procedure Laterality Date  . Abdominal wall mass excision  2010   Dr. Bary Castilla  . CARDIAC CATHETERIZATION  07/2014   No blockages. per patient one side of heart was not functioning well, CHF-Dr. Nehemiah Massed  . CHOLECYSTECTOMY  2005   Lapraroscopic  . COLONOSCOPY  [id number]  . COLONOSCOPY WITH PROPOFOL N/A 04/16/2017   Procedure: COLONOSCOPY WITH PROPOFOL;  Surgeon: Robert Bellow, MD;  Location: [facility] ENDOSCOPY;  Service: Endoscopy;  Laterality: N/A;  . CT Scan of Abdomen  12/05/2008   Soft Tissue mass 2.42cm x 3.98 cm of Abdominal wall lateral to umbilicus, pathology shows fibromatosis. Multiple large liver cysts up to 9cm x 9cm  . KNEE SURGERY  1994   Arthroscopy. Surgery by Dr. Marry Guan  . RIGHT COLECTOMY  2006   Dr.Byrnett  . TONSILLECTOMY AND ADENOIDECTOMY  1960's  . UPPER GI ENDOSCOPY  2010    Home Medications:  Allergies as of 02/16/2019   No Known Allergies     Medication List       Accurate as of February 16, 2019  8:46 AM. If you have any questions, ask your nurse or doctor.        STOP taking these  medications   multivitamin capsule Stopped by: Abbie Sons, MD   naproxen sodium 220 MG tablet Commonly known as: ALEVE Stopped by: Abbie Sons, MD   vitamin C 100 MG tablet Stopped by: Abbie Sons, MD     TAKE these medications   atorvastatin 40 MG tablet Commonly known as: LIPITOR TAKE 1 TABLET BY MOUTH ONCE DAILY   ferrous sulfate 325 (65 FE) MG tablet Take by mouth 3 (three) times a week.   lisinopril 20 MG tablet Commonly known as: ZESTRIL Take 1 tablet by mouth daily.   metoprolol tartrate 25 MG tablet Commonly known as: LOPRESSOR Take 1 tablet by mouth 2 (two) times daily.   omeprazole 40 MG capsule Commonly known as: PRILOSEC Take 1 capsule (40 mg total) by mouth daily.   spironolactone 25 MG tablet Commonly known as: ALDACTONE Take 12.5 mg by mouth daily.   tamsulosin 0.4 MG Caps capsule Commonly known as: FLOMAX TAKE 1 CAPSULE BY MOUTH ONCE DAILY       Allergies: No Known Allergies  Family History: Family History  Problem Relation Age of Onset  . Colon polyps Father   . Diabetes Father   . CAD Father        had CABG  . Stroke Father   . Dementia Father   . Congestive Heart Failure Mother   .  Heart attack Maternal Grandfather   . Diabetes Paternal Grandmother   . Heart attack Paternal Grandfather   . Heart disease Brother        also had history of VSD  . Aneurysm Brother   . Prostate cancer Neg Hx   . Bladder Cancer Neg Hx   . Kidney cancer Neg Hx     Social History:  reports that he quit smoking about 28 years ago. His smoking use included cigarettes. He has a 8.00 pack-year smoking history. He has never used smokeless tobacco. He reports current alcohol use of about 3.0 - 4.0 standard drinks of alcohol per week. He reports that he does not use drugs.  ROS: UROLOGY Frequent Urination?: Yes Hard to postpone urination?: No Burning/pain with urination?: No Get up at night to urinate?: Yes Leakage of urine?: No Urine stream  starts and stops?: Yes Trouble starting stream?: No Do you have to strain to urinate?: No Blood in urine?: No Urinary tract infection?: No Sexually transmitted disease?: No Injury to kidneys or bladder?: No Painful intercourse?: No Weak stream?: No Erection problems?: No Penile pain?: No  Gastrointestinal Nausea?: No Vomiting?: No Indigestion/heartburn?: No Diarrhea?: No Constipation?: No  Constitutional Fever: No Night sweats?: No Weight loss?: No Fatigue?: Yes  Skin Skin rash/lesions?: No Itching?: No  Eyes Blurred vision?: No Double vision?: No  Ears/Nose/Throat Sore throat?: No Sinus problems?: No  Hematologic/Lymphatic Swollen glands?: No Easy bruising?: Yes  Cardiovascular Leg swelling?: No Chest pain?: No  Respiratory Cough?: No Shortness of breath?: No  Endocrine Excessive thirst?: No  Musculoskeletal Back pain?: No Joint pain?: No  Neurological Headaches?: No Dizziness?: Yes  Psychologic Depression?: No Anxiety?: No  Physical Exam: BP 115/76 (BP Location: Left Arm, Patient Position: Sitting, Cuff Size: Normal)   Pulse 72   Ht 6\' 1"  (1.854 m)   Wt 220 lb 9.6 oz (100.1 kg)   BMI 29.10 kg/m   Constitutional:  Alert and oriented, No acute distress. HEENT: [place] AT, moist mucus membranes.  Trachea midline, no masses. Cardiovascular: No clubbing, cyanosis, or edema. Respiratory: Normal respiratory effort, no increased work of breathing. GI: Abdomen is soft, nontender, nondistended, no abdominal masses GU: No CVA tenderness.  Prostate 50 g, smooth without nodules Lymph: No cervical or inguinal lymphadenopathy. Skin: No rashes, bruises or suspicious lesions. Neurologic: Grossly intact, no focal deficits, moving all 4 extremities. Psychiatric: Normal mood and affect.   Assessment & Plan:   68 year old male with mild PSA elevation and previous benign biopsy.  His PSA is stable and DRE is benign.  Stable lower urinary tract symptoms on  tamsulosin which he will continue.  Return in about 1 year (around 02/16/2020) for Recheck, PSA.   Abbie Sons, [place] [address] [place], [place] [address] [phone]

## 2019-02-16 NOTE — Patient Instructions (Signed)

## 2019-02-25 ENCOUNTER — Telehealth: Payer: Self-pay

## 2019-02-25 NOTE — Telephone Encounter (Signed)
Patient called to cancel his appt on 7/7. He reports that he will be out of town that week. He is willing to do a telephone visit. Please call the patient to let him know what day and times you would be available. Contact info is correct. Thanks!

## 2019-03-02 ENCOUNTER — Ambulatory Visit: Payer: PPO

## 2019-03-04 NOTE — Telephone Encounter (Signed)
Scheduled telephonic AWV for 03/29/19 @ 2:00 PM. -MM

## 2019-03-11 ENCOUNTER — Other Ambulatory Visit: Payer: Self-pay | Admitting: Family Medicine

## 2019-03-11 DIAGNOSIS — R3911 Hesitancy of micturition: Secondary | ICD-10-CM

## 2019-03-29 ENCOUNTER — Other Ambulatory Visit: Payer: Self-pay

## 2019-03-29 ENCOUNTER — Ambulatory Visit (INDEPENDENT_AMBULATORY_CARE_PROVIDER_SITE_OTHER): Payer: PPO

## 2019-03-29 DIAGNOSIS — Z0000 Encounter for general adult medical examination without abnormal findings: Secondary | ICD-10-CM | POA: Diagnosis not present

## 2019-03-29 NOTE — Patient Instructions (Signed)
Derek Jackson , Thank you for taking time to come for your Medicare Wellness Visit. I appreciate your ongoing commitment to your health goals. Please review the following plan we discussed and let me know if I can assist you in the future.   Screening recommendations/referrals: Colonoscopy: Up to date, due 03/2022 Recommended yearly ophthalmology/optometry visit for glaucoma screening and checkup Recommended yearly dental visit for hygiene and checkup  Vaccinations: Influenza vaccine: Up to date Pneumococcal vaccine: Completed series Tdap vaccine: Pt declines today.  Shingles vaccine: Pt declines today.     Advanced directives: Please bring a copy of your POA (Power of Attorney) and/or Living Will to your next appointment.   Conditions/risks identified: Continue to increase water intake to 6-8 8 oz glasses a day.   Next appointment: 06/16/19 @ 9:00 AM with Dr Caryn Section.   Preventive Care 69 Years and Older, Male Preventive care refers to lifestyle choices and visits with your health care provider that can promote health and wellness. What does preventive care include?  A yearly physical exam. This is also called an annual well check.  Dental exams once or twice a year.  Routine eye exams. Ask your health care provider how often you should have your eyes checked.  Personal lifestyle choices, including:  Daily care of your teeth and gums.  Regular physical activity.  Eating a healthy diet.  Avoiding tobacco and drug use.  Limiting alcohol use.  Practicing safe sex.  Taking low doses of aspirin every day.  Taking vitamin and mineral supplements as recommended by your health care provider. What happens during an annual well check? The services and screenings done by your health care provider during your annual well check will depend on your age, overall health, lifestyle risk factors, and family history of disease. Counseling  Your health care provider may ask you questions  about your:  Alcohol use.  Tobacco use.  Drug use.  Emotional well-being.  Home and relationship well-being.  Sexual activity.  Eating habits.  History of falls.  Memory and ability to understand (cognition).  Work and work Statistician. Screening  You may have the following tests or measurements:  Height, weight, and BMI.  Blood pressure.  Lipid and cholesterol levels. These may be checked every 5 years, or more frequently if you are over 40 years old.  Skin check.  Lung cancer screening. You may have this screening every year starting at age 23 if you have a 30-pack-year history of smoking and currently smoke or have quit within the past 15 years.  Fecal occult blood test (FOBT) of the stool. You may have this test every year starting at age 65.  Flexible sigmoidoscopy or colonoscopy. You may have a sigmoidoscopy every 5 years or a colonoscopy every 10 years starting at age 70.  Prostate cancer screening. Recommendations will vary depending on your family history and other risks.  Hepatitis C blood test.  Hepatitis B blood test.  Sexually transmitted disease (STD) testing.  Diabetes screening. This is done by checking your blood sugar (glucose) after you have not eaten for a while (fasting). You may have this done every 1-3 years.  Abdominal aortic aneurysm (AAA) screening. You may need this if you are a current or former smoker.  Osteoporosis. You may be screened starting at age 84 if you are at high risk. Talk with your health care provider about your test results, treatment options, and if necessary, the need for more tests. Vaccines  Your health care provider may  recommend certain vaccines, such as:  Influenza vaccine. This is recommended every year.  Tetanus, diphtheria, and acellular pertussis (Tdap, Td) vaccine. You may need a Td booster every 10 years.  Zoster vaccine. You may need this after age 69.  Pneumococcal 13-valent conjugate (PCV13)  vaccine. One dose is recommended after age 42.  Pneumococcal polysaccharide (PPSV23) vaccine. One dose is recommended after age 36. Talk to your health care provider about which screenings and vaccines you need and how often you need them. This information is not intended to replace advice given to you by your health care provider. Make sure you discuss any questions you have with your health care provider. Document Released: 09/08/2015 Document Revised: 05/01/2016 Document Reviewed: 06/13/2015 Elsevier Interactive Patient Education  2017 [place] Prevention in the Home Falls can cause injuries. They can happen to people of all ages. There are many things you can do to make your home safe and to help prevent falls. What can I do on the outside of my home?  Regularly fix the edges of walkways and driveways and fix any cracks.  Remove anything that might make you trip as you walk through a door, such as a raised step or threshold.  Trim any bushes or trees on the path to your home.  Use bright outdoor lighting.  Clear any walking paths of anything that might make someone trip, such as rocks or tools.  Regularly check to see if handrails are loose or broken. Make sure that both sides of any steps have handrails.  Any raised decks and porches should have guardrails on the edges.  Have any leaves, snow, or ice cleared regularly.  Use sand or salt on walking paths during winter.  Clean up any spills in your garage right away. This includes oil or grease spills. What can I do in the bathroom?  Use night lights.  Install grab bars by the toilet and in the tub and shower. Do not use towel bars as grab bars.  Use non-skid mats or decals in the tub or shower.  If you need to sit down in the shower, use a plastic, non-slip stool.  Keep the floor dry. Clean up any water that spills on the floor as soon as it happens.  Remove soap buildup in the tub or shower regularly.   Attach bath mats securely with double-sided non-slip rug tape.  Do not have throw rugs and other things on the floor that can make you trip. What can I do in the bedroom?  Use night lights.  Make sure that you have a light by your bed that is easy to reach.  Do not use any sheets or blankets that are too big for your bed. They should not hang down onto the floor.  Have a firm chair that has side arms. You can use this for support while you get dressed.  Do not have throw rugs and other things on the floor that can make you trip. What can I do in the kitchen?  Clean up any spills right away.  Avoid walking on wet floors.  Keep items that you use a lot in easy-to-reach places.  If you need to reach something above you, use a strong step stool that has a grab bar.  Keep electrical cords out of the way.  Do not use floor polish or wax that makes floors slippery. If you must use wax, use non-skid floor wax.  Do not have throw rugs and  other things on the floor that can make you trip. What can I do with my stairs?  Do not leave any items on the stairs.  Make sure that there are handrails on both sides of the stairs and use them. Fix handrails that are broken or loose. Make sure that handrails are as long as the stairways.  Check any carpeting to make sure that it is firmly attached to the stairs. Fix any carpet that is loose or worn.  Avoid having throw rugs at the top or bottom of the stairs. If you do have throw rugs, attach them to the floor with carpet tape.  Make sure that you have a light switch at the top of the stairs and the bottom of the stairs. If you do not have them, ask someone to add them for you. What else can I do to help prevent falls?  Wear shoes that:  Do not have high heels.  Have rubber bottoms.  Are comfortable and fit you well.  Are closed at the toe. Do not wear sandals.  If you use a stepladder:  Make sure that it is fully opened. Do not climb  a closed stepladder.  Make sure that both sides of the stepladder are locked into place.  Ask someone to hold it for you, if possible.  Clearly mark and make sure that you can see:  Any grab bars or handrails.  First and last steps.  Where the edge of each step is.  Use tools that help you move around (mobility aids) if they are needed. These include:  Canes.  Walkers.  Scooters.  Crutches.  Turn on the lights when you go into a dark area. Replace any light bulbs as soon as they burn out.  Set up your furniture so you have a clear path. Avoid moving your furniture around.  If any of your floors are uneven, fix them.  If there are any pets around you, be aware of where they are.  Review your medicines with your doctor. Some medicines can make you feel dizzy. This can increase your chance of falling. Ask your doctor what other things that you can do to help prevent falls. This information is not intended to replace advice given to you by your health care provider. Make sure you discuss any questions you have with your health care provider. Document Released: 06/08/2009 Document Revised: 01/18/2016 Document Reviewed: 09/16/2014 Elsevier Interactive Patient Education  2017 [employer].

## 2019-03-29 NOTE — Progress Notes (Signed)
Subjective:   Derek Jackson is a 68 y.o. male who presents for Medicare Annual/Subsequent preventive examination.    This visit is being conducted through telemedicine due to the COVID-19 pandemic. This patient has given me verbal consent via doximity to conduct this visit, patient states they are participating from their home address. Some vital signs may be absent or patient reported.    Patient identification: identified by name, DOB, and current address  Review of Systems:  N/A  Cardiac Risk Factors include: advanced age (>43men, >57 women);dyslipidemia;hypertension;male gender     Objective:    Vitals: There were no vitals taken for this visit.  There is no height or weight on file to calculate BMI. Unable to obtain vitals due to visit being conducted via telephonically.   Advanced Directives 03/29/2019 12/01/2017 04/16/2017 12/18/2016  Does Patient Have a Medical Advance Directive? Yes Yes Yes Yes  Type of Paramedic of [place];Living will [place];Living will Living will [place];Living will  Copy of [place] in Chart? No - copy requested No - copy requested - No - copy requested    Tobacco Social History   Tobacco Use  Smoking Status Former Smoker  . Packs/day: 1.00  . Years: 8.00  . Pack years: 8.00  . Types: Cigarettes  . Quit date: 04/26/1990  . Years since quitting: 28.9  Smokeless Tobacco Never Used     Counseling given: Not Answered   Clinical Intake:  Pre-visit preparation completed: Yes  Pain Score: 0-No pain     Nutritional Risks: None Diabetes: No  How often do you need to have someone help you when you read instructions, pamphlets, or other written materials from your doctor or pharmacy?: 1 - Never  Interpreter Needed?: No  Information entered by :: [facility], LPN  Past Medical History:  Diagnosis Date  . BPH (benign prostatic hypertrophy)   . Esophagitis,  reflux   . Kidney stone   . Rosacea   . Systolic CHF ([place]) 0174   Past Surgical History:  Procedure Laterality Date  . Abdominal wall mass excision  2010   Dr. Bary Castilla  . CARDIAC CATHETERIZATION  07/2014   No blockages. per patient one side of heart was not functioning well, CHF-Dr. Nehemiah Massed  . CHOLECYSTECTOMY  2005   Lapraroscopic  . COLONOSCOPY  [id number]  . COLONOSCOPY WITH PROPOFOL N/A 04/16/2017   Procedure: COLONOSCOPY WITH PROPOFOL;  Surgeon: Robert Bellow, MD;  Location: [facility] ENDOSCOPY;  Service: Endoscopy;  Laterality: N/A;  . CT Scan of Abdomen  12/05/2008   Soft Tissue mass 2.42cm x 3.98 cm of Abdominal wall lateral to umbilicus, pathology shows fibromatosis. Multiple large liver cysts up to 9cm x 9cm  . KNEE SURGERY  1994   Arthroscopy. Surgery by Dr. Marry Guan  . RIGHT COLECTOMY  2006   Dr.Byrnett  . TONSILLECTOMY AND ADENOIDECTOMY  1960's  . UPPER GI ENDOSCOPY  2010   Family History  Problem Relation Age of Onset  . Colon polyps Father   . Diabetes Father   . CAD Father        had CABG  . Stroke Father   . Dementia Father   . Congestive Heart Failure Mother   . Heart attack Maternal Grandfather   . Diabetes Paternal Grandmother   . Heart attack Paternal Grandfather   . Heart disease Brother        also had history of VSD  . Aneurysm Brother   .  Prostate cancer Neg Hx   . Bladder Cancer Neg Hx   . Kidney cancer Neg Hx    Social History   Socioeconomic History  . Marital status: Single    Spouse name: Not on file  . Number of children: 3  . Years of education: Not on file  . Highest education level: Some college, no degree  Occupational History  . Occupation: Employed    Comment: Works at [employer]: part time  [place]  . Financial resource strain: Not hard at all  . Food insecurity    Worry: Never true    Inability: Never true  . Transportation needs    Medical: No    Non-medical: No  Tobacco Use  . Smoking  status: Former Smoker    Packs/day: 1.00    Years: 8.00    Pack years: 8.00    Types: Cigarettes    Quit date: 04/26/1990    Years since quitting: 28.9  . Smokeless tobacco: Never Used  Substance and Sexual Activity  . Alcohol use: Yes    Alcohol/week: 3.0 - 4.0 standard drinks    Types: 3 - 4 Cans of beer per week    Comment: socially  . Drug use: No  . Sexual activity: Yes    Birth control/protection: None  Lifestyle  . Physical activity    Days per week: 0 days    Minutes per session: 0 min  . Stress: Only a little  Relationships  . Social Herbalist on phone: Patient refused    Gets together: Patient refused    Attends religious service: Patient refused    Active member of club or organization: Patient refused    Attends meetings of clubs or organizations: Patient refused    Relationship status: Patient refused  Other Topics Concern  . Not on file  Social History Narrative   Retired April 2017, still working 3 days a week    Outpatient Encounter Medications as of 03/29/2019  Medication Sig  . atorvastatin (LIPITOR) 40 MG tablet TAKE 1 TABLET BY MOUTH ONCE DAILY (Patient taking differently: Take 20 mg by mouth daily at 6 PM. )  . ferrous sulfate 325 (65 FE) MG tablet Take by mouth 3 (three) times a week.  Marland Kitchen lisinopril (PRINIVIL,ZESTRIL) 20 MG tablet Take 1 tablet by mouth daily.  . metoprolol tartrate (LOPRESSOR) 25 MG tablet Take 1 tablet by mouth 2 (two) times daily.  Marland Kitchen omeprazole (PRILOSEC) 40 MG capsule Take 1 capsule (40 mg total) by mouth daily.  Marland Kitchen spironolactone (ALDACTONE) 25 MG tablet Take 12.5 mg by mouth daily.   . tamsulosin (FLOMAX) 0.4 MG CAPS capsule TAKE 1 CAPSULE BY MOUTH ONCE DAILY   No facility-administered encounter medications on file as of 03/29/2019.     Activities of Daily Living In your present state of health, do you have any difficulty performing the following activities: 03/29/2019  Hearing? N  Vision? N  Comment Wears eye glasses  daily.  Difficulty concentrating or making decisions? N  Walking or climbing stairs? Y  Comment Due to SOB.  Dressing or bathing? N  Doing errands, shopping? N  Preparing Food and eating ? N  Using the Toilet? N  In the past six months, have you accidently leaked urine? N  Do you have problems with loss of bowel control? N  Managing your Medications? N  Managing your Finances? N  Housekeeping or managing your Housekeeping? N  Some  recent data might be hidden    Patient Care Team: Birdie Sons, MD as PCP - General (Family Medicine) Byrnett, [name], MD (General Surgery) Abbie Sons, MD as Consulting Physician (Urology) Corey Skains, MD as Consulting Physician (Cardiology)   Assessment:   This is a routine wellness examination for Derek Jackson.  Exercise Activities and Dietary recommendations Current Exercise Habits: Home exercise routine, Type of exercise: walking, Time (Minutes): 40, Frequency (Times/Week): 5, Weekly Exercise (Minutes/Week): 200, Intensity: Mild, Exercise limited by: None identified  Goals    . DIET - INCREASE WATER INTAKE     Recommend increasing water intake to 4 glasses a day.        Fall Risk: Fall Risk  03/29/2019 12/01/2017 12/18/2016 12/18/2015  Falls in the past year? 0 No No No    FALL RISK PREVENTION PERTAINING TO THE HOME:  Any stairs in or around the home? Yes  If so, are there any without handrails? No   Home free of loose throw rugs in walkways, pet beds, electrical cords, etc? Yes  Adequate lighting in your home to reduce risk of falls? Yes   ASSISTIVE DEVICES UTILIZED TO PREVENT FALLS:  Life alert? No  Use of a cane, walker or w/c? No  Grab bars in the bathroom? No  Shower chair or bench in shower? Yes  Elevated toilet seat or a handicapped toilet? No   TIMED UP AND GO:  Was the test performed? No .    Depression Screen PHQ 2/9 Scores 03/29/2019 12/01/2017 12/18/2016 12/18/2015  PHQ - 2 Score 0 1 5 2   PHQ- 9 Score - - 8 4     Cognitive Function: Declined today.      6CIT Screen 12/18/2016  What Year? 0 points  What month? 0 points  What time? 0 points  Count back from 20 0 points  Months in reverse 0 points  Repeat phrase 4 points  Total Score 4    Immunization History  Administered Date(s) Administered  . H1N1 07/13/2008  . Influenza-Unspecified 06/04/2018  . Pneumococcal Conjugate-13 12/18/2015  . Pneumococcal Polysaccharide-23 12/23/2016  . Td 03/21/1999, 11/25/2003  . Zoster 10/22/2010    Qualifies for Shingles Vaccine? Yes  Zostavax completed 08/22/11. Due for Shingrix. Education has been provided regarding the importance of this vaccine. Pt has been advised to call insurance company to determine out of pocket expense. Advised may also receive vaccine at local pharmacy or Health Dept. Verbalized acceptance and understanding.  Tdap: Although this vaccine is not a covered service during a Wellness Exam, does the patient still wish to receive this vaccine today?  No .   Flu Vaccine: Up to date  Pneumococcal Vaccine: Completed series  Screening Tests Health Maintenance  Topic Date Due  . TETANUS/TDAP  11/24/2013  . INFLUENZA VACCINE  03/27/2019  . COLONOSCOPY  04/16/2022  . Hepatitis C Screening  Completed  . PNA vac Low Risk Adult  Completed   Cancer Screenings:  Colorectal Screening: Completed 04/16/17. Repeat every 5 years.  Lung Cancer Screening: (Low Dose CT Chest recommended if Age 32-80 years, 30 pack-year currently smoking OR have quit w/in 15years.) does not qualify.   Additional Screening:  Hepatitis C Screening: Up to date  Vision Screening: Recommended annual ophthalmology exams for early detection of glaucoma and other disorders of the eye.  Dental Screening: Recommended annual dental exams for proper oral hygiene  Community Resource Referral:  CRR required this visit?  No  Plan:  I have personally reviewed and addressed the Medicare Annual Wellness  questionnaire and have noted the following in the patient's chart:  A. Medical and social history B. Use of alcohol, tobacco or illicit drugs  C. Current medications and supplements D. Functional ability and status E.  Nutritional status F.  Physical activity G. Advance directives H. List of other physicians I.  Hospitalizations, surgeries, and ER visits in previous 12 months J.  [place] such as hearing and vision if needed, cognitive and depression L. Referrals and appointments   In addition, I have reviewed and discussed with patient certain preventive protocols, quality metrics, and best practice recommendations. A written personalized care plan for preventive services as well as general preventive health recommendations were provided to patient.   [place] Score, LPN  0/02/6150 Nurse Health Advisor   Nurse Notes: None.

## 2019-04-15 ENCOUNTER — Other Ambulatory Visit: Payer: Self-pay

## 2019-04-15 DIAGNOSIS — U071 COVID-19: Secondary | ICD-10-CM

## 2019-04-15 DIAGNOSIS — Z20822 Contact with and (suspected) exposure to covid-19: Secondary | ICD-10-CM

## 2019-04-15 HISTORY — DX: COVID-19: U07.1

## 2019-04-16 LAB — NOVEL CORONAVIRUS, NAA: SARS-CoV-2, NAA: DETECTED — AB

## 2019-04-20 ENCOUNTER — Encounter: Payer: Self-pay | Admitting: Family Medicine

## 2019-04-20 ENCOUNTER — Telehealth: Payer: Self-pay | Admitting: Family Medicine

## 2019-04-20 DIAGNOSIS — Z8616 Personal history of COVID-19: Secondary | ICD-10-CM

## 2019-04-20 DIAGNOSIS — Z8619 Personal history of other infectious and parasitic diseases: Secondary | ICD-10-CM | POA: Insufficient documentation

## 2019-04-20 HISTORY — DX: Personal history of COVID-19: Z86.16

## 2019-04-20 NOTE — Telephone Encounter (Signed)
Pt called saying he got tested last thursday am and found Friday that he was positive.  He is feeling fairly decent.  Feels like he has a head cold.  Fatigue.  No fever.  He was just calling to let us know  CB# [phone]  [place]

## 2019-05-07 DIAGNOSIS — E782 Mixed hyperlipidemia: Secondary | ICD-10-CM | POA: Diagnosis not present

## 2019-05-07 DIAGNOSIS — I5022 Chronic systolic (congestive) heart failure: Secondary | ICD-10-CM | POA: Diagnosis not present

## 2019-05-07 DIAGNOSIS — I447 Left bundle-branch block, unspecified: Secondary | ICD-10-CM | POA: Diagnosis not present

## 2019-05-07 DIAGNOSIS — I10 Essential (primary) hypertension: Secondary | ICD-10-CM | POA: Diagnosis not present

## 2019-06-16 ENCOUNTER — Other Ambulatory Visit: Payer: Self-pay

## 2019-06-16 ENCOUNTER — Ambulatory Visit (INDEPENDENT_AMBULATORY_CARE_PROVIDER_SITE_OTHER): Payer: PPO | Admitting: Family Medicine

## 2019-06-16 ENCOUNTER — Encounter: Payer: Self-pay | Admitting: Family Medicine

## 2019-06-16 VITALS — BP 120/70 | HR 64 | Temp 96.8°F | Ht 73.0 in | Wt 226.0 lb

## 2019-06-16 DIAGNOSIS — I5022 Chronic systolic (congestive) heart failure: Secondary | ICD-10-CM

## 2019-06-16 DIAGNOSIS — Z0000 Encounter for general adult medical examination without abnormal findings: Secondary | ICD-10-CM

## 2019-06-16 DIAGNOSIS — E782 Mixed hyperlipidemia: Secondary | ICD-10-CM | POA: Diagnosis not present

## 2019-06-16 DIAGNOSIS — I10 Essential (primary) hypertension: Secondary | ICD-10-CM | POA: Diagnosis not present

## 2019-06-16 DIAGNOSIS — R5383 Other fatigue: Secondary | ICD-10-CM | POA: Diagnosis not present

## 2019-06-16 NOTE — Progress Notes (Signed)
Patient: Derek Jackson, Male    DOB: 1951/08/08, 68 y.o.   MRN: [id number] Visit Date: 06/16/2019  Today's Provider: Lelon Huh, MD   Chief Complaint  Patient presents with  . Annual Exam   Subjective:  Derek Jackson is a 68 y.o. male who presents today for health maintenance and complete physical. He feels fairly well. He reports exercising daily. He reports he is sleeping well.  He has multiple chronic medical problems including systolic dysfunction and coronary artery disease followed by [facility] cardiology with recent visit on 05/07/2019. He reports feeling very fatigued despite walking for exercise every day. Has a little shortness of breath with exertion but unchanged. Denies feeling sleepy during the day and does not fall asleep when sedentary during the daytime.    Immunization History  Administered Date(s) Administered  . H1N1 07/13/2008  . Influenza, High Dose Seasonal PF 06/11/2019  . Influenza-Unspecified 06/04/2018  . Pneumococcal Conjugate-13 12/18/2015  . Pneumococcal Polysaccharide-23 12/23/2016  . Td 03/21/1999, 11/25/2003  . Zoster 10/22/2010   03/29/2019 Medicare Wellness with Nurse Health Advisor 04/16/2017 Colonoscopy, Dr Ricardo Jericho, repeat in 5 years Last labs 11/09/2018 PSA (hx of elevated PSA) 07/02/18 Lipids, Met C 12/29/17 TSH, CBC  Patient was diagnosed on April 14, 2019 with Covid positive test.    Review of Systems  Constitutional: Positive for fatigue.  HENT: Positive for tinnitus.   Eyes: Negative.   Respiratory: Positive for shortness of breath (with exertion, chronic unchanged).   Cardiovascular: Negative.   Gastrointestinal: Positive for nausea.  Endocrine: Negative.   Genitourinary: Positive for frequency.  Musculoskeletal: Positive for arthralgias, myalgias and neck pain.  Skin: Negative.   Allergic/Immunologic: Negative.   Neurological: Positive for dizziness and headaches.  Hematological: Negative.    Psychiatric/Behavioral: The patient is nervous/anxious.     Social History   Socioeconomic History  . Marital status: Single    Spouse name: Not on file  . Number of children: 3  . Years of education: Not on file  . Highest education level: Some college, no degree  Occupational History  . Occupation: Employed    Comment: Works at [employer]: part time  [place]  . Financial resource strain: Not hard at all  . Food insecurity    Worry: Never true    Inability: Never true  . Transportation needs    Medical: No    Non-medical: No  Tobacco Use  . Smoking status: Former Smoker    Packs/day: 1.00    Years: 8.00    Pack years: 8.00    Types: Cigarettes    Quit date: 04/26/1990    Years since quitting: 29.1  . Smokeless tobacco: Never Used  Substance and Sexual Activity  . Alcohol use: Yes    Alcohol/week: 3.0 - 4.0 standard drinks    Types: 3 - 4 Cans of beer per week    Comment: socially  . Drug use: No  . Sexual activity: Yes    Birth control/protection: None  Lifestyle  . Physical activity    Days per week: 0 days    Minutes per session: 0 min  . Stress: Only a little  Relationships  . Social Herbalist on phone: Patient refused    Gets together: Patient refused    Attends religious service: Patient refused    Active member of club or organization: Patient refused    Attends meetings of clubs or organizations: Patient refused    Relationship  status: Patient refused  . Intimate partner violence    Fear of current or ex partner: Patient refused    Emotionally abused: Patient refused    Physically abused: Patient refused    Forced sexual activity: Patient refused  Other Topics Concern  . Not on file  Social History Narrative   Retired April 2017, still working 3 days a week    Patient Active Problem List   Diagnosis Date Noted  . History of 2019 novel coronavirus disease (COVID-19) 04/20/2019  . Bradycardia  10/06/2018  . Elevated PSA 03/18/2017  . LBBB (left bundle branch block) 02/10/2017  . BPH (benign prostatic hyperplasia) 04/27/2015  . Chest pain 04/27/2015  . Dyspnea on exertion 04/27/2015  . Fatigue 04/27/2015  . History of colon cancer 04/27/2015  . History of shingles 04/27/2015  . History of kidney stones 04/27/2015  . Systolic CHF ([place]) 40/03/6760  . Tinnitus 04/27/2015  . Benign essential hypertension 12/12/2014  . Hyperlipemia, mixed 07/25/2014  . Stricture and stenosis of esophagus 05/10/2009  . Fam hx-ischem heart disease 07/13/2008  . Dermatophytosis, nail 05/03/2007  . Adjustment disorder with mixed anxiety and depressed mood 09/09/2006  . Esophagitis, reflux 06/20/2006  . Rosacea 06/20/2006    Past Surgical History:  Procedure Laterality Date  . Abdominal wall mass excision  2010   Dr. Bary Castilla  . CARDIAC CATHETERIZATION  07/2014   No blockages. per patient one side of heart was not functioning well, CHF-Dr. Nehemiah Massed  . CHOLECYSTECTOMY  2005   Lapraroscopic  . COLONOSCOPY  [id number]  . COLONOSCOPY WITH PROPOFOL N/A 04/16/2017   Procedure: COLONOSCOPY WITH PROPOFOL;  Surgeon: Robert Bellow, MD;  Location: [facility] ENDOSCOPY;  Service: Endoscopy;  Laterality: N/A;  . CT Scan of Abdomen  12/05/2008   Soft Tissue mass 2.42cm x 3.98 cm of Abdominal wall lateral to umbilicus, pathology shows fibromatosis. Multiple large liver cysts up to 9cm x 9cm  . KNEE SURGERY  1994   Arthroscopy. Surgery by Dr. Marry Guan  . RIGHT COLECTOMY  2006   Dr.Byrnett  . TONSILLECTOMY AND ADENOIDECTOMY  1960's  . UPPER GI ENDOSCOPY  2010    His family history includes Aneurysm in his brother; CAD in his father; Colon polyps in his father; Congestive Heart Failure in his mother; Dementia in his father; Diabetes in his father and paternal grandmother; Heart attack in his maternal grandfather and paternal grandfather; Heart disease in his brother; Stroke in his father.     Outpatient  Encounter Medications as of 06/16/2019  Medication Sig Note  . atorvastatin (LIPITOR) 40 MG tablet TAKE 1 TABLET BY MOUTH ONCE DAILY (Patient taking differently: Take 20 mg by mouth daily at 6 PM. )   . ferrous sulfate 325 (65 FE) MG tablet Take by mouth 3 (three) times a week.   Marland Kitchen lisinopril (PRINIVIL,ZESTRIL) 20 MG tablet Take 1 tablet by mouth daily. 04/27/2015: Received from: [place]: Take by mouth.  . metoprolol tartrate (LOPRESSOR) 25 MG tablet Take 1 tablet by mouth 2 (two) times daily. 04/27/2015: Received from: [place]: Take by mouth.  . Multiple Vitamin (MULTIVITAMIN) tablet Take 1 tablet by mouth daily.   Marland Kitchen omeprazole (PRILOSEC) 40 MG capsule Take 1 capsule (40 mg total) by mouth daily.   Marland Kitchen spironolactone (ALDACTONE) 25 MG tablet Take 12.5 mg by mouth daily.    . tamsulosin (FLOMAX) 0.4 MG CAPS capsule TAKE 1 CAPSULE BY MOUTH ONCE DAILY    No facility-administered encounter medications  on file as of 06/16/2019.     Patient Care Team: Birdie Sons, MD as PCP - General (Family Medicine) Bary Castilla, [name], MD (General Surgery) Abbie Sons, MD as Consulting Physician (Urology) Corey Skains, MD as Consulting Physician (Cardiology)      Objective:   Vitals:  Vitals:   06/16/19 0855  BP: 120/70  Pulse: 64  Temp: (!) 96.8 F (36 C)  TempSrc: Skin  SpO2: 96%  Weight: 226 lb (102.5 kg)  Height: '6\' 1"'$  (1.854 m)    Physical Exam   General Appearance:    Overweight male. Alert, cooperative, in no acute distress, appears stated age  Head:    Normocephalic, without obvious abnormality, atraumatic  Eyes:    PERRL, conjunctiva/corneas clear, EOM's intact, fundi    benign, both eyes       Ears:    Normal TM's and external ear canals, both ears  Nose:   Nares normal, septum midline, mucosa normal, no drainage   or sinus tenderness  Throat:   Lips, mucosa, and tongue normal; teeth and gums normal   Neck:   Supple, symmetrical, trachea midline, no adenopathy;       thyroid:  No enlargement/tenderness/nodules; no carotid   bruit or JVD  Back:     Symmetric, no curvature, ROM normal, no CVA tenderness  Lungs:     Clear to auscultation bilaterally, respirations unlabored  Chest wall:    No tenderness or deformity  Heart:    Normal heart rate. Normal rhythm. No murmurs, rubs, or gallops.  S1 and S2 normal  Abdomen:     Soft, non-tender, bowel sounds active all four quadrants,    no masses, no organomegaly  Genitalia:    deferred  Rectal:    deferred  Extremities:   All extremities are intact. No cyanosis or edema  Pulses:   2+ and symmetric all extremities  Skin:   Skin color, texture, turgor normal, no rashes or lesions  Lymph nodes:   Cervical, supraclavicular, and axillary nodes normal  Neurologic:   CNII-XII intact. Normal strength, sensation and reflexes      throughout    Depression Screen PHQ 2/9 Scores 03/29/2019 12/01/2017 12/18/2016 12/18/2015  PHQ - 2 Score 0 '1 5 2  '$ PHQ- 9 Score - - 8 4      Assessment & Plan:     Routine Health Maintenance and Physical Exam  Exercise Activities and Dietary recommendations Goals    . DIET - INCREASE WATER INTAKE     Recommend increasing water intake to 4 glasses a day.        Immunization History  Administered Date(s) Administered  . H1N1 07/13/2008  . Influenza, High Dose Seasonal PF 06/11/2019  . Influenza-Unspecified 06/04/2018  . Pneumococcal Conjugate-13 12/18/2015  . Pneumococcal Polysaccharide-23 12/23/2016  . Td 03/21/1999, 11/25/2003  . Zoster 10/22/2010    Health Maintenance  Topic Date Due  . TETANUS/TDAP  11/24/2013  . INFLUENZA VACCINE  03/27/2019  . COLONOSCOPY  04/16/2022  . Hepatitis C Screening  Completed  . PNA vac Low Risk Adult  Completed     Discussed health benefits of physical activity, and encouraged him to engage in regular exercise appropriate for his age and condition.    1. Annual  physical exam   2. Benign essential hypertension Well controlled.  Continue current medications.    3. Hyperlipemia, mixed He is tolerating atorvastatin well with no adverse effects.   - Lipid panel  4. Chronic systolic  congestive heart failure ([place]) Currently well compensated, continue regular follow up [facility] cardiology  - Comprehensive metabolic panel  6. Other fatigue No hypersomnia, no observed apnic episodes with sleep.  - CBC - TSH  Other orders - Multiple Vitamin (MULTIVITAMIN) tablet; Take 1 tablet by mouth daily.

## 2019-06-16 NOTE — Patient Instructions (Addendum)
.   Please review the attached list of medications and notify my office if there are any errors.   . Please bring all of your medications to every appointment so we can make sure that our medication list is the same as yours.    The CDC recommends two doses of Shingrix (the shingles vaccine) separated by 2 to 6 months for adults age 69 years and older. I recommend checking with your insurance plan regarding coverage for this vaccine.   

## 2019-06-17 LAB — COMPREHENSIVE METABOLIC PANEL
ALT: 73 IU/L — ABNORMAL HIGH (ref 0–44)
AST: 48 IU/L — ABNORMAL HIGH (ref 0–40)
Albumin/Globulin Ratio: 2.3 — ABNORMAL HIGH (ref 1.2–2.2)
Albumin: 4.4 g/dL (ref 3.8–4.8)
Alkaline Phosphatase: 115 IU/L (ref 39–117)
BUN/Creatinine Ratio: 16 (ref 10–24)
BUN: 15 mg/dL (ref 8–27)
Bilirubin Total: 0.6 mg/dL (ref 0.0–1.2)
CO2: 22 mmol/L (ref 20–29)
Calcium: 9.8 mg/dL (ref 8.6–10.2)
Chloride: 102 mmol/L (ref 96–106)
Creatinine, Ser: 0.92 mg/dL (ref 0.76–1.27)
GFR calc Af Amer: 98 mL/min/{1.73_m2} (ref >59)
GFR calc non Af Amer: 85 mL/min/{1.73_m2} (ref >59)
Globulin, Total: 1.9 g/dL (ref 1.5–4.5)
Glucose: 113 mg/dL — ABNORMAL HIGH (ref 65–99)
Potassium: 4.6 mmol/L (ref 3.5–5.2)
Sodium: 137 mmol/L (ref 134–144)
Total Protein: 6.3 g/dL (ref 6.0–8.5)

## 2019-06-17 LAB — CBC
Hematocrit: 40.8 % (ref 37.5–51.0)
Hemoglobin: 13.7 g/dL (ref 13.0–17.7)
MCH: 30.9 pg (ref 26.6–33.0)
MCHC: 33.6 g/dL (ref 31.5–35.7)
MCV: 92 fL (ref 79–97)
Platelets: 301 10*3/uL (ref 150–450)
RBC: 4.44 x10E6/uL (ref 4.14–5.80)
RDW: 13 % (ref 11.6–15.4)
WBC: 5.9 10*3/uL (ref 3.4–10.8)

## 2019-06-17 LAB — TSH: TSH: 1.39 u[IU]/mL (ref 0.450–4.500)

## 2019-06-17 LAB — LIPID PANEL
Chol/HDL Ratio: 2.3 ratio (ref 0.0–5.0)
Cholesterol, Total: 119 mg/dL (ref 100–199)
HDL: 51 mg/dL (ref >39)
LDL Chol Calc (NIH): 52 mg/dL (ref 0–99)
Triglycerides: 80 mg/dL (ref 0–149)
VLDL Cholesterol Cal: 16 mg/dL (ref 5–40)

## 2019-06-18 ENCOUNTER — Encounter: Payer: Self-pay | Admitting: Family Medicine

## 2019-06-18 ENCOUNTER — Other Ambulatory Visit: Payer: Self-pay | Admitting: Family Medicine

## 2019-06-18 DIAGNOSIS — K7689 Other specified diseases of liver: Secondary | ICD-10-CM | POA: Insufficient documentation

## 2019-06-18 DIAGNOSIS — K760 Fatty (change of) liver, not elsewhere classified: Secondary | ICD-10-CM | POA: Insufficient documentation

## 2019-06-18 DIAGNOSIS — R7401 Elevation of levels of liver transaminase levels: Secondary | ICD-10-CM

## 2019-06-28 ENCOUNTER — Other Ambulatory Visit: Payer: Self-pay

## 2019-06-28 ENCOUNTER — Telehealth: Payer: Self-pay

## 2019-06-28 ENCOUNTER — Ambulatory Visit
Admission: RE | Admit: 2019-06-28 | Discharge: 2019-06-28 | Disposition: A | Discharge status: Home / Self Care | Payer: PPO | Source: Ambulatory Visit | Attending: Family Medicine | Admitting: Family Medicine

## 2019-06-28 DIAGNOSIS — K7689 Other specified diseases of liver: Secondary | ICD-10-CM | POA: Diagnosis not present

## 2019-06-28 DIAGNOSIS — R7401 Elevation of levels of liver transaminase levels: Secondary | ICD-10-CM | POA: Insufficient documentation

## 2019-06-28 DIAGNOSIS — K760 Fatty (change of) liver, not elsewhere classified: Secondary | ICD-10-CM | POA: Insufficient documentation

## 2019-06-28 NOTE — Telephone Encounter (Signed)
Patient advised. 6 month follow up scheduled.

## 2019-06-28 NOTE — Telephone Encounter (Signed)
-----   Message from Birdie Sons, MD sent at 06/28/2019 11:45 AM EST ----- Ultrasound shows two liver cysts that have not changed since first seen in 2010 and are benign does not require any specific follow up as long as he does not have any abdominal pain.  Need to schedule follow for lipids and to recheck liver functions in 6 months.

## 2019-09-14 DIAGNOSIS — H40003 Preglaucoma, unspecified, bilateral: Secondary | ICD-10-CM | POA: Diagnosis not present

## 2019-11-03 DIAGNOSIS — L565 Disseminated superficial actinic porokeratosis (DSAP): Secondary | ICD-10-CM | POA: Diagnosis not present

## 2019-11-03 DIAGNOSIS — L570 Actinic keratosis: Secondary | ICD-10-CM | POA: Diagnosis not present

## 2019-11-03 DIAGNOSIS — L821 Other seborrheic keratosis: Secondary | ICD-10-CM | POA: Diagnosis not present

## 2019-11-03 DIAGNOSIS — Z85828 Personal history of other malignant neoplasm of skin: Secondary | ICD-10-CM | POA: Diagnosis not present

## 2019-11-03 DIAGNOSIS — X32XXXA Exposure to sunlight, initial encounter: Secondary | ICD-10-CM | POA: Diagnosis not present

## 2019-11-05 DIAGNOSIS — R001 Bradycardia, unspecified: Secondary | ICD-10-CM | POA: Diagnosis not present

## 2019-11-05 DIAGNOSIS — I10 Essential (primary) hypertension: Secondary | ICD-10-CM | POA: Diagnosis not present

## 2019-11-05 DIAGNOSIS — I5022 Chronic systolic (congestive) heart failure: Secondary | ICD-10-CM | POA: Diagnosis not present

## 2019-11-05 DIAGNOSIS — E782 Mixed hyperlipidemia: Secondary | ICD-10-CM | POA: Diagnosis not present

## 2019-11-15 DIAGNOSIS — R519 Headache, unspecified: Secondary | ICD-10-CM | POA: Diagnosis not present

## 2019-11-29 ENCOUNTER — Other Ambulatory Visit: Payer: Self-pay | Admitting: Family Medicine

## 2019-11-29 DIAGNOSIS — K2100 Gastro-esophageal reflux disease with esophagitis, without bleeding: Secondary | ICD-10-CM

## 2019-12-06 DIAGNOSIS — I5022 Chronic systolic (congestive) heart failure: Secondary | ICD-10-CM | POA: Diagnosis not present

## 2019-12-13 DIAGNOSIS — R9431 Abnormal electrocardiogram [ECG] [EKG]: Secondary | ICD-10-CM | POA: Diagnosis not present

## 2019-12-13 DIAGNOSIS — I10 Essential (primary) hypertension: Secondary | ICD-10-CM | POA: Diagnosis not present

## 2019-12-13 DIAGNOSIS — I5022 Chronic systolic (congestive) heart failure: Secondary | ICD-10-CM | POA: Diagnosis not present

## 2019-12-13 DIAGNOSIS — E782 Mixed hyperlipidemia: Secondary | ICD-10-CM | POA: Diagnosis not present

## 2019-12-13 DIAGNOSIS — I447 Left bundle-branch block, unspecified: Secondary | ICD-10-CM | POA: Diagnosis not present

## 2019-12-27 ENCOUNTER — Ambulatory Visit: Payer: PPO | Admitting: Family Medicine

## 2020-01-14 NOTE — Progress Notes (Signed)
Established patient visit   Patient: Derek Jackson   DOB: 1950-11-19   69 y.o. Male  MRN: [id number] Visit Date: 01/17/2020  Today's healthcare provider: Lelon Huh, MD   Chief Complaint  Patient presents with  . Hyperlipidemia   Mertie Moores as a scribe for Lelon Huh, MD.,have documented all relevant documentation on the behalf of Lelon Huh, MD,as directed by  Lelon Huh, MD while in the presence of Lelon Huh, MD.  Subjective    HPI   Lipid/Cholesterol, Follow-up  Last lipid panel Other pertinent labs  Lab Results  Component Value Date   CHOL 119 06/16/2019   HDL 51 06/16/2019   LDLCALC 52 06/16/2019   TRIG 80 06/16/2019   CHOLHDL 2.3 06/16/2019   Lab Results  Component Value Date   ALT 73 (H) 06/16/2019   AST 48 (H) 06/16/2019   PLT 301 06/16/2019   TSH 1.390 06/16/2019     He was last seen for this 6 months ago.  Management since that visit includes no changes.  He reports good compliance with treatment. He is not having side effects.   Symptoms: No chest pain No chest pressure/discomfort  No dyspnea No lower extremity edema  No numbness or tingling of extremity No orthopnea  No palpitations No paroxysmal nocturnal dyspnea  No speech difficulty No syncope   Current diet: well balanced Current exercise: walking  The ASCVD Risk score Mikey Bussing DC Jr., et al., 2013) failed to calculate for the following reasons:   The valid total cholesterol range is 130 to 320 mg/dL  ---------------------------------------------------------------------------------------------------  Tinnitus R>L is present      Medications: Outpatient Medications Prior to Visit  Medication Sig  . atorvastatin (LIPITOR) 40 MG tablet TAKE 1 TABLET BY MOUTH ONCE DAILY (Patient taking differently: Take 20 mg by mouth daily at 6 PM. )  . latanoprost (XALATAN) 0.005 % ophthalmic solution Place 1 drop into both eyes at bedtime.  Marland Kitchen lisinopril  (PRINIVIL,ZESTRIL) 20 MG tablet Take 1 tablet by mouth daily.  . metoprolol tartrate (LOPRESSOR) 25 MG tablet Take 1 tablet by mouth 2 (two) times daily.  . Multiple Vitamin (MULTIVITAMIN) tablet Take 1 tablet by mouth daily.  Marland Kitchen omeprazole (PRILOSEC) 40 MG capsule TAKE 1 CAPSULE BY MOUTH ONCE DAILY  . spironolactone (ALDACTONE) 25 MG tablet Take 12.5 mg by mouth daily.   . tamsulosin (FLOMAX) 0.4 MG CAPS capsule TAKE 1 CAPSULE BY MOUTH ONCE DAILY  . ferrous sulfate 325 (65 FE) MG tablet Take by mouth 3 (three) times a week.   No facility-administered medications prior to visit.     {Heme  Chem  Endocrine  Serology  Results Review (optional):[id number]::" "}  Objective    BP 113/71 (BP Location: Right Arm, Patient Position: Sitting, Cuff Size: Large)   Pulse 62   Temp (!) 97.3 F (36.3 C) (Temporal)   Wt 225 lb 6.4 oz (102.2 kg)   BMI 29.74 kg/m    Physical Exam   General appearance:  Overweight male, cooperative and in no acute distress ENT: both ear canals clear. No cerumen.  Respiratory: Respirations even and unlabored, normal respiratory rate Extremities: All extremities are intact.  Skin: Skin color, texture, turgor normal. No rashes seen  Psych: Appropriate mood and affect. Neurologic: Mental status: Alert, oriented to person, place, and time, thought content appropriate.    Assessment & Plan     1. Hyperlipemia, mixed He is tolerating atorvastatin well with no adverse effects.   - Lipid  Profile  2. Chronic systolic congestive heart failure (HCC) Stable, recently had extensive cardiac function by Dr. Nehemiah Massed  3. Elevated PSA He states he intends to follow up with Dr. Bernardo Heater soon and will have PSA checked there.   4. Tinnitus of both ears counseled  5. Elevated transaminase level  - Comprehensive metabolic panel - Hepatitis C antibody - Hepatitis B surface antigen - Hepatitis B Surface AntiBODY      The entirety of the information documented in the  History of Present Illness, Review of Systems and Physical Exam were personally obtained by me. Portions of this information were initially documented by the CMA and reviewed by me for thoroughness and accuracy.      Lelon Huh, MD  [facility] [phone] (phone) [phone] (fax)  [place]

## 2020-01-17 ENCOUNTER — Other Ambulatory Visit: Payer: Self-pay

## 2020-01-17 ENCOUNTER — Encounter: Payer: Self-pay | Admitting: Family Medicine

## 2020-01-17 ENCOUNTER — Ambulatory Visit (INDEPENDENT_AMBULATORY_CARE_PROVIDER_SITE_OTHER): Payer: PPO | Admitting: Family Medicine

## 2020-01-17 VITALS — BP 113/71 | HR 62 | Temp 97.3°F | Wt 225.4 lb

## 2020-01-17 DIAGNOSIS — R972 Elevated prostate specific antigen [PSA]: Secondary | ICD-10-CM

## 2020-01-17 DIAGNOSIS — I5022 Chronic systolic (congestive) heart failure: Secondary | ICD-10-CM

## 2020-01-17 DIAGNOSIS — E782 Mixed hyperlipidemia: Secondary | ICD-10-CM | POA: Diagnosis not present

## 2020-01-17 DIAGNOSIS — H9313 Tinnitus, bilateral: Secondary | ICD-10-CM | POA: Diagnosis not present

## 2020-01-17 DIAGNOSIS — R7401 Elevation of levels of liver transaminase levels: Secondary | ICD-10-CM

## 2020-01-17 NOTE — Patient Instructions (Signed)
. Please review the attached list of medications and notify my office if there are any errors.   . Please bring your Covid vaccine card to your next appointment so we can update your medical record    Tinnitus Tinnitus refers to hearing a sound when there is no actual source for that sound. This is often described as ringing in the ears. However, people with this condition may hear a variety of noises, in one ear or in both ears. The sounds of tinnitus can be soft, loud, or somewhere in between. Tinnitus can last for a few seconds or can be constant for days. It may go away without treatment and come back at various times. When tinnitus is constant or happens often, it can lead to other problems, such as trouble sleeping and trouble concentrating. Almost everyone experiences tinnitus at some point. Tinnitus that is long-lasting (chronic) or comes back often (recurs) may require medical attention. What are the causes? The cause of tinnitus is often not known. In some cases, it can result from:  Exposure to loud noises from machinery, music, or other sources.  An object (foreign body) stuck in the ear.  Earwax buildup.  Drinking alcohol or caffeine.  Taking certain medicines.  Age-related hearing loss. It may also be caused by medical conditions such as:  Ear or sinus infections.  High blood pressure.  Heart diseases.  Anemia.  Allergies.  Meniere's disease.  Thyroid problems.  Tumors.  A weak, bulging blood vessel (aneurysm) near the ear. What are the signs or symptoms? The main symptom of tinnitus is hearing a sound when there is no source for that sound. It may sound like:  Buzzing.  Roaring.  Ringing.  Blowing air.  Hissing.  Whistling.  Sizzling.  Humming.  Running water.  A musical note.  Tapping. Symptoms may affect only one ear (unilateral) or both ears (bilateral). How is this diagnosed? Tinnitus is diagnosed based on your symptoms, your  medical history, and a physical exam. Your health care provider may do a thorough hearing test (audiologic exam) if your tinnitus:  Is unilateral.  Causes hearing difficulties.  Lasts 6 months or longer. You may work with a health care provider who specializes in hearing disorders (audiologist). You may be asked questions about your symptoms and how they affect your daily life. You may have other tests done, such as:  CT scan.  MRI.  An imaging test of how blood flows through your blood vessels (angiogram). How is this treated? Treating an underlying medical condition can sometimes make tinnitus go away. If your tinnitus continues, other treatments may include:  Medicines.  Therapy and counseling to help you manage the stress of living with tinnitus.  Sound generators to mask the tinnitus. These include: ? Tabletop sound machines that play relaxing sounds to help you fall asleep. ? Wearable devices that fit in your ear and play sounds or music. ? Acoustic neural stimulation. This involves using headphones to listen to music that contains an auditory signal. Over time, listening to this signal may change some pathways in your brain and make you less sensitive to tinnitus. This treatment is used for very severe cases when no other treatment is working.  Using hearing aids or cochlear implants if your tinnitus is related to hearing loss. Hearing aids are worn in the outer ear. Cochlear implants are surgically placed in the inner ear. Follow these instructions at home: Managing symptoms      When possible, avoid being in  loud places and being exposed to loud sounds.  Wear hearing protection, such as earplugs, when you are exposed to loud noises.  Use a white noise machine, a humidifier, or other devices to mask the sound of tinnitus.  Practice techniques for reducing stress, such as meditation, yoga, or deep breathing. Work with your health care provider if you need help with  managing stress.  Sleep with your head slightly raised. This may reduce the impact of tinnitus. General instructions  Do not use stimulants, such as nicotine, alcohol, or caffeine. Talk with your health care provider about other stimulants to avoid. Stimulants are substances that can make you feel alert and attentive by increasing certain activities in the body (such as heart rate and blood pressure). These substances may make tinnitus worse.  Take over-the-counter and prescription medicines only as told by your health care provider.  Try to get plenty of sleep each night.  Keep all follow-up visits as told by your health care provider. This is important. Contact a health care provider if:  Your tinnitus continues for 3 weeks or longer without stopping.  You develop sudden hearing loss.  Your symptoms get worse or do not get better with home care.  You feel you are not able to manage the stress of living with tinnitus. Get help right away if:  You develop tinnitus after a head injury.  You have tinnitus along with any of the following: ? Dizziness. ? Loss of balance. ? Nausea and vomiting. ? Sudden, severe headache. These symptoms may represent a serious problem that is an emergency. Do not wait to see if the symptoms will go away. Get medical help right away. Call your local emergency services (911 in the [place] Do not drive yourself to the hospital. Summary  Tinnitus refers to hearing a sound when there is no actual source for that sound. This is often described as ringing in the ears.  Symptoms may affect only one ear (unilateral) or both ears (bilateral).  Use a white noise machine, a humidifier, or other devices to mask the sound of tinnitus.  Do not use stimulants, such as nicotine, alcohol, or caffeine. Talk with your health care provider about other stimulants to avoid. These substances may make tinnitus worse. This information is not intended to replace advice given  to you by your health care provider. Make sure you discuss any questions you have with your health care provider. Document Revised: 02/24/2019 Document Reviewed: 05/22/2017 Elsevier Patient Education  2020 [employer].

## 2020-01-18 LAB — COMPREHENSIVE METABOLIC PANEL
ALT: 46 IU/L — ABNORMAL HIGH (ref 0–44)
AST: 34 IU/L (ref 0–40)
Albumin/Globulin Ratio: 2.5 — ABNORMAL HIGH (ref 1.2–2.2)
Albumin: 4.5 g/dL (ref 3.8–4.8)
Alkaline Phosphatase: 119 IU/L (ref 48–121)
BUN/Creatinine Ratio: 18 (ref 10–24)
BUN: 15 mg/dL (ref 8–27)
Bilirubin Total: 0.5 mg/dL (ref 0.0–1.2)
CO2: 21 mmol/L (ref 20–29)
Calcium: 9.7 mg/dL (ref 8.6–10.2)
Chloride: 103 mmol/L (ref 96–106)
Creatinine, Ser: 0.83 mg/dL (ref 0.76–1.27)
GFR calc Af Amer: 104 mL/min/{1.73_m2} (ref >59)
GFR calc non Af Amer: 90 mL/min/{1.73_m2} (ref >59)
Globulin, Total: 1.8 g/dL (ref 1.5–4.5)
Glucose: 125 mg/dL — ABNORMAL HIGH (ref 65–99)
Potassium: 4.7 mmol/L (ref 3.5–5.2)
Sodium: 137 mmol/L (ref 134–144)
Total Protein: 6.3 g/dL (ref 6.0–8.5)

## 2020-01-18 LAB — HEPATITIS B SURFACE ANTIBODY,QUALITATIVE: Hep B Surface Ab, Qual: REACTIVE

## 2020-01-18 LAB — LIPID PANEL
Chol/HDL Ratio: 2.8 ratio (ref 0.0–5.0)
Cholesterol, Total: 114 mg/dL (ref 100–199)
HDL: 41 mg/dL (ref >39)
LDL Chol Calc (NIH): 51 mg/dL (ref 0–99)
Triglycerides: 125 mg/dL (ref 0–149)
VLDL Cholesterol Cal: 22 mg/dL (ref 5–40)

## 2020-01-18 LAB — HEPATITIS B SURFACE ANTIGEN: Hepatitis B Surface Ag: NEGATIVE

## 2020-01-18 LAB — HEPATITIS C ANTIBODY: Hep C Virus Ab: <0.1 s/co ratio (ref 0.0–0.9)

## 2020-02-04 ENCOUNTER — Other Ambulatory Visit: Payer: Self-pay | Admitting: Family Medicine

## 2020-02-04 DIAGNOSIS — R972 Elevated prostate specific antigen [PSA]: Secondary | ICD-10-CM

## 2020-02-07 ENCOUNTER — Other Ambulatory Visit: Payer: Self-pay

## 2020-02-07 ENCOUNTER — Other Ambulatory Visit: Payer: PPO

## 2020-02-07 DIAGNOSIS — R972 Elevated prostate specific antigen [PSA]: Secondary | ICD-10-CM

## 2020-02-08 LAB — PSA: Prostate Specific Ag, Serum: 3.4 ng/mL (ref 0.0–4.0)

## 2020-02-14 ENCOUNTER — Ambulatory Visit: Payer: PPO | Admitting: Urology

## 2020-02-21 ENCOUNTER — Other Ambulatory Visit: Payer: Self-pay

## 2020-02-21 ENCOUNTER — Other Ambulatory Visit: Payer: Self-pay | Admitting: Family Medicine

## 2020-02-21 ENCOUNTER — Ambulatory Visit: Payer: PPO | Admitting: Urology

## 2020-02-21 ENCOUNTER — Encounter: Payer: Self-pay | Admitting: Urology

## 2020-02-21 VITALS — BP 116/71 | HR 75 | Ht 73.0 in | Wt 222.8 lb

## 2020-02-21 DIAGNOSIS — Z87898 Personal history of other specified conditions: Secondary | ICD-10-CM | POA: Diagnosis not present

## 2020-02-21 DIAGNOSIS — N401 Enlarged prostate with lower urinary tract symptoms: Secondary | ICD-10-CM | POA: Diagnosis not present

## 2020-02-21 DIAGNOSIS — K2100 Gastro-esophageal reflux disease with esophagitis, without bleeding: Secondary | ICD-10-CM

## 2020-02-21 NOTE — Progress Notes (Signed)
PATIENT ID: Derek Jackson, male     DOB: 1950-10-08, 69 y.o.     MRN: [id number]   ENCOUNTER: 02/21/20, 9:02 AM     REFERRING PROVIDER: Birdie Sons, MD [address] [place],  [place] [address]  Chief Complaint  Patient presents with  . Elevated PSA    Urologic history: 1.  Elevated PSA             -Prostate biopsy 03/2018; PSA 4.7; volume 53 cc; benign pathology  2.  BPH with lower urinary tract symptoms             -On tamsulosin  HPI: Derek Jackson is a 69 y.o. male here today for a follow up of his elevated PSA. He has a history of slightly elevated PSA.  Today he reports:  Rx makes him slightly nauseated  Denies changes in urinary symptoms  Denies hematuria, frequency, flank pain,   Pt states he has been exercising and intentionally losing weight   PMHx: Past Medical History:  Diagnosis Date  . BPH (benign prostatic hypertrophy)   . COVID-19 04/15/2019  . Esophagitis, reflux   . History of shingles 04/27/2015  . Kidney stone   . Rosacea   . Systolic CHF ([place]) [id number]    SURGICAL HISTORY: Past Surgical History:  Procedure Laterality Date  . Abdominal wall mass excision  2010   Dr. Bary Castilla  . CARDIAC CATHETERIZATION  07/2014   No blockages. per patient one side of heart was not functioning well, CHF-Dr. Nehemiah Massed  . CHOLECYSTECTOMY  2005   Lapraroscopic  . COLONOSCOPY  [id number]  . COLONOSCOPY WITH PROPOFOL N/A 04/16/2017   Procedure: COLONOSCOPY WITH PROPOFOL;  Surgeon: Robert Bellow, MD;  Location: [facility] ENDOSCOPY;  Service: Endoscopy;  Laterality: N/A;  . CT Scan of Abdomen  12/05/2008   Soft Tissue mass 2.42cm x 3.98 cm of Abdominal wall lateral to umbilicus, pathology shows fibromatosis. Multiple large liver cysts up to 9cm x 9cm  . KNEE SURGERY  1994   Arthroscopy. Surgery by Dr. Marry Guan  . RIGHT COLECTOMY  2006   Dr.Byrnett  . TONSILLECTOMY AND ADENOIDECTOMY  1960's  . UPPER GI ENDOSCOPY  2010    HOME MEDICATIONS:    Allergies as of 02/21/2020   No Known Allergies     Medication List       Accurate as of February 21, 2020  9:02 AM. If you have any questions, ask your nurse or doctor.        atorvastatin 40 MG tablet Commonly known as: LIPITOR TAKE 1 TABLET BY MOUTH ONCE DAILY What changed:   how much to take  when to take this   ferrous sulfate 325 (65 FE) MG tablet Take by mouth 3 (three) times a week.   latanoprost 0.005 % ophthalmic solution Commonly known as: XALATAN Place 1 drop into both eyes at bedtime.   lisinopril 20 MG tablet Commonly known as: ZESTRIL Take 1 tablet by mouth daily.   metoprolol tartrate 25 MG tablet Commonly known as: LOPRESSOR Take 1 tablet by mouth 2 (two) times daily.   multivitamin tablet Take 1 tablet by mouth daily.   omeprazole 40 MG capsule Commonly known as: PRILOSEC TAKE 1 CAPSULE BY MOUTH ONCE DAILY   spironolactone 25 MG tablet Commonly known as: ALDACTONE Take 12.5 mg by mouth daily.   tamsulosin 0.4 MG Caps capsule Commonly known as: FLOMAX TAKE 1 CAPSULE BY MOUTH ONCE DAILY  ALLERGIES: No Known Allergies  FAMILY HISTORY: Family History  Problem Relation Age of Onset  . Colon polyps Father   . Diabetes Father   . CAD Father        had CABG  . Stroke Father   . Dementia Father   . Congestive Heart Failure Mother   . Heart attack Maternal Grandfather   . Diabetes Paternal Grandmother   . Heart attack Paternal Grandfather   . Heart disease Brother        also had history of VSD  . Aneurysm Brother   . Prostate cancer Neg Hx   . Bladder Cancer Neg Hx   . Kidney cancer Neg Hx     SOCIAL HISTORY:  reports that he quit smoking about 29 years ago. His smoking use included cigarettes. He has a 8.00 pack-year smoking history. He has never used smokeless tobacco. He reports current alcohol use of about 3.0 - 4.0 standard drinks of alcohol per week. He reports that he does not use drugs.  PHYSICAL EXAM: BP 116/71    Pulse 75   Ht 6\' 1"  (1.854 m)   Wt 222 lb 12.8 oz (101.1 kg)   BMI 29.39 kg/m   Constitutional:  Alert and oriented, No acute distress. Intentional weight loss.  HEENT: [place] AT, moist mucus membranes.  Trachea midline, no masses. Cardiovascular: No clubbing, cyanosis, or edema. Respiratory: Normal respiratory effort, no increased work of breathing. GI: Abdomen is soft, nontender, nondistended, no abdominal masses GU: No CVA tenderness Prostate: Normal, smooth, 50 grams, no nodules or induration Skin: No rashes, bruises or suspicious lesions. Neurologic: Grossly intact, no focal deficits, moving all 4 extremities. Psychiatric: Normal mood and affect.  Wt Readings from Last 3 Encounters:  02/21/20 222 lb 12.8 oz (101.1 kg)  01/17/20 225 lb 6.4 oz (102.2 kg)  06/16/19 226 lb (102.5 kg)   LABORATORY DATA:  Component     Latest Ref Rng & Units 12/18/2015 12/23/2016 03/17/2017 11/10/2017  Prostate Specific Ag, Serum     0.0 - 4.0 ng/mL 3.0 4.2 (H) 4.7 (H) 4.3 (H)   Component     Latest Ref Rng & Units 05/20/2018 11/09/2018 02/07/2020  Prostate Specific Ag, Serum     0.0 - 4.0 ng/mL 3.7 4.3 (H) 3.4     ASSESSMENT/PLAN:   1.  History elevated PSA  Most recent PSA stable 3.4   RTC 1 year w/ PSA prior  2.  BPH with LUTS  Stable on tamsulosin  We discussed other management options including increasing tamsulosin dose and outlet procedures  He has elected to continue management   Abbie Sons, MD  [facility] [address], [place], [place] [address] [phone]  By signing my name below, I, [employer], attest that this documentation has been prepared under the direction and in the presence of John Giovanni, MD. Electronically Signed: Franchot Erichsen 02/21/20, 9:02 AM   I have reviewed the above documentation for accuracy and completeness, and I agree with the above.   Abbie Sons, MD

## 2020-03-20 DIAGNOSIS — H40003 Preglaucoma, unspecified, bilateral: Secondary | ICD-10-CM | POA: Diagnosis not present

## 2020-03-24 ENCOUNTER — Telehealth: Payer: Self-pay | Admitting: Family Medicine

## 2020-03-24 NOTE — Telephone Encounter (Signed)
Copied from [place] [id number]. Topic: Medicare AWV >> Mar 24, 2020  2:54 PM Cher Nakai R wrote: Reason for CRM:  Left message for patient to call back and schedule Medicare Annual Wellness Visit (AWV) either virtually or in office.  Last AWV 03/29/2019   Please schedule at anytime with [facility] Health Advisor.

## 2020-04-26 ENCOUNTER — Telehealth: Payer: Self-pay | Admitting: Family Medicine

## 2020-04-26 NOTE — Telephone Encounter (Signed)
Copied from [place] [id number]. Topic: Medicare AWV >> Apr 26, 2020 11:47 AM Cher Nakai R wrote: Reason for CRM: Left message for patient to call back and schedule Medicare Annual Wellness Visit (AWV) either virtually or in office.  Last AWV 03/29/2019  Please schedule at anytime with [facility] Health Advisor.  If any questions, please contact me at [phone]

## 2020-05-16 NOTE — Progress Notes (Signed)
Subjective:   Derek Jackson is a 69 y.o. male who presents for Medicare Annual/Subsequent preventive examination.  I connected with Tommie Sams today by telephone and verified that I am speaking with the correct person using two identifiers. Location patient: home Location provider: work Persons participating in the virtual visit: patient, provider.   I discussed the limitations, risks, security and privacy concerns of performing an evaluation and management service by telephone and the availability of in person appointments. I also discussed with the patient that there may be a patient responsible charge related to this service. The patient expressed understanding and verbally consented to this telephonic visit.    Interactive audio and video telecommunications were attempted between this provider and patient, however failed, due to patient having technical difficulties OR patient did not have access to video capability.  We continued and completed visit with audio only.   Review of Systems    N/A  Cardiac Risk Factors include: advanced age (>1men, >81 women);dyslipidemia;male gender;hypertension     Objective:    There were no vitals filed for this visit. There is no height or weight on file to calculate BMI.  Advanced Directives 05/17/2020 03/29/2019 12/01/2017 04/16/2017 12/18/2016  Does Patient Have a Medical Advance Directive? Yes Yes Yes Yes Yes  Type of Paramedic of [place];Living will [place];Living will [place];Living will Living will [place];Living will  Copy of Hope in Chart? No - copy requested No - copy requested No - copy requested - No - copy requested    Current Medications (verified) Outpatient Encounter Medications as of 05/17/2020  Medication Sig   atorvastatin (LIPITOR) 40 MG tablet TAKE 1 TABLET BY MOUTH ONCE DAILY (Patient taking differently: 20  mg. )   latanoprost (XALATAN) 0.005 % ophthalmic solution Place 1 drop into both eyes at bedtime.   lisinopril (PRINIVIL,ZESTRIL) 20 MG tablet Take 1 tablet by mouth daily.   metoprolol tartrate (LOPRESSOR) 25 MG tablet Take 1 tablet by mouth 2 (two) times daily.   omeprazole (PRILOSEC) 40 MG capsule TAKE 1 CAPSULE BY MOUTH ONCE DAILY   spironolactone (ALDACTONE) 25 MG tablet Take 12.5 mg by mouth daily.    tamsulosin (FLOMAX) 0.4 MG CAPS capsule TAKE 1 CAPSULE BY MOUTH ONCE DAILY   ferrous sulfate 325 (65 FE) MG tablet Take by mouth 3 (three) times a week. (Patient not taking: Reported on 05/17/2020)   Multiple Vitamin (MULTIVITAMIN) tablet Take 1 tablet by mouth daily. (Patient not taking: Reported on 05/17/2020)   No facility-administered encounter medications on file as of 05/17/2020.    Allergies (verified) Patient has no known allergies.   History: Past Medical History:  Diagnosis Date   BPH (benign prostatic hypertrophy)    COVID-19 04/15/2019   Esophagitis, reflux    Glaucoma    History of shingles 04/27/2015   Kidney stone    Rosacea    Systolic CHF ([place]) 7408   Past Surgical History:  Procedure Laterality Date   Abdominal wall mass excision  2010   Dr. Bary Castilla   CARDIAC CATHETERIZATION  07/2014   No blockages. per patient one side of heart was not functioning well, CHF-Dr. Nehemiah Massed   CHOLECYSTECTOMY  2005   Lapraroscopic   COLONOSCOPY  [id number]   COLONOSCOPY WITH PROPOFOL N/A 04/16/2017   Procedure: COLONOSCOPY WITH PROPOFOL;  Surgeon: Robert Bellow, MD;  Location: [facility] ENDOSCOPY;  Service: Endoscopy;  Laterality: N/A;   CT Scan of  Abdomen  12/05/2008   Soft Tissue mass 2.42cm x 3.98 cm of Abdominal wall lateral to umbilicus, pathology shows fibromatosis. Multiple large liver cysts up to 9cm x 9cm   KNEE SURGERY  1994   Arthroscopy. Surgery by Dr. Marry Guan   RIGHT COLECTOMY  2006   Dr.Byrnett   TONSILLECTOMY AND ADENOIDECTOMY  1960's    UPPER GI ENDOSCOPY  2010   Family History  Problem Relation Age of Onset   Colon polyps Father    Diabetes Father    CAD Father        had CABG   Stroke Father    Dementia Father    Congestive Heart Failure Mother    Heart attack Maternal Grandfather    Diabetes Paternal Grandmother    Heart attack Paternal Grandfather    Heart disease Brother        also had history of VSD   Aneurysm Brother    Prostate cancer Neg Hx    Bladder Cancer Neg Hx    Kidney cancer Neg Hx    Social History   Socioeconomic History   Marital status: Significant Other    Spouse name: Not on file   Number of children: 3   Years of education: Not on file   Highest education level: Some college, no degree  Occupational History   Occupation: Employed    Comment: Works at [employer]: part time  Tobacco Use   Smoking status: Former Smoker    Packs/day: 1.00    Years: 8.00    Pack years: 8.00    Types: Cigarettes    Quit date: 04/26/1990    Years since quitting: 30.0   Smokeless tobacco: Never Used  Vaping Use   Vaping Use: Never used  Substance and Sexual Activity   Alcohol use: Yes    Alcohol/week: 0.0 standard drinks    Comment: 6 pack a month/socially   Drug use: No   Sexual activity: Yes    Birth control/protection: None  Other Topics Concern   Not on file  Social History Narrative   Retired April 2017, still working 3 days a week   Social Determinants of Radio broadcast assistant Strain: Low Risk    Difficulty of Paying Living Expenses: Not hard at all  Food Insecurity: No Food Insecurity   Worried About Charity fundraiser in the Last Year: Never true   Arboriculturist in the Last Year: Never true  Transportation Needs: No Transportation Needs   Lack of Transportation (Medical): No   Lack of Transportation (Non-Medical): No  Physical Activity: Sufficiently Active   Days of Exercise per Week: 6 days   Minutes of  Exercise per Session: 40 min  Stress: No Stress Concern Present   Feeling of Stress : Only a little  Social Connections: Moderately Isolated   Frequency of Communication with Friends and Family: Three times a week   Frequency of Social Gatherings with Friends and Family: More than three times a week   Attends Religious Services: Never   Marine scientist or Organizations: No   Attends Music therapist: Never   Marital Status: Living with partner    Tobacco Counseling Counseling given: Not Answered   Clinical Intake:  Pre-visit preparation completed: Yes  Pain : No/denies pain     Nutritional Risks: None Diabetes: No  How often do you need to have someone help you when you read instructions, pamphlets, or  other written materials from your doctor or pharmacy?: 1 - Never  Diabetic? No  Interpreter Needed?: No  Information entered by :: [facility], LPN   Activities of Daily Living In your present state of health, do you have any difficulty performing the following activities: 05/17/2020  Hearing? N  Vision? N  Difficulty concentrating or making decisions? N  Walking or climbing stairs? Y  Comment Due to SOB from CHF.  Dressing or bathing? N  Doing errands, shopping? N  Preparing Food and eating ? N  Using the Toilet? N  In the past six months, have you accidently leaked urine? N  Do you have problems with loss of bowel control? N  Managing your Medications? N  Managing your Finances? N  Housekeeping or managing your Housekeeping? N  Some recent data might be hidden    Patient Care Team: Birdie Sons, MD as PCP - General (Family Medicine) Bary Castilla, [name], MD (General Surgery) Abbie Sons, MD as Consulting Physician (Urology) Corey Skains, MD as Consulting Physician (Cardiology) [facility], [facility]) Marchia Meiers, MD as Consulting Physician (Ophthalmology) Oneta Rack, MD (Dermatology)  Indicate any  recent Medical Services you may have received from other than [facility] providers in the past year (date may be approximate).     Assessment:   This is a routine wellness examination for [place].  Hearing/Vision screen No exam data present  Dietary issues and exercise activities discussed: Current Exercise Habits: Home exercise routine, Type of exercise: walking, Time (Minutes): 45, Frequency (Times/Week): 6, Weekly Exercise (Minutes/Week): 270, Intensity: Mild, Exercise limited by: None identified  Goals     DIET - INCREASE WATER INTAKE     Recommend increasing water intake to 4 glasses a day.       Depression Screen PHQ 2/9 Scores 05/17/2020 03/29/2019 12/01/2017 12/18/2016 12/18/2015  PHQ - 2 Score 0 0 1 5 2   PHQ- 9 Score - - - 8 4    Fall Risk Fall Risk  05/17/2020 01/17/2020 03/29/2019 03/29/2019 12/01/2017  Falls in the past year? 0 0 0 0 No  Comment - - - Emmi Telephone Survey: data to providers prior to load -  Number falls in past yr: 0 0 - - -  Injury with Fall? 0 0 - - -    Any stairs in or around the home? Yes  If so, are there any without handrails? No  Home free of loose throw rugs in walkways, pet beds, electrical cords, etc? Yes  Adequate lighting in your home to reduce risk of falls? Yes   ASSISTIVE DEVICES UTILIZED TO PREVENT FALLS:  Life alert? No  Use of a cane, walker or w/c? No  Grab bars in the bathroom? No  Shower chair or bench in shower? Yes  Elevated toilet seat or a handicapped toilet? No    Cognitive Function: Declined today.     6CIT Screen 12/18/2016  What Year? 0 points  What month? 0 points  What time? 0 points  Count back from 20 0 points  Months in reverse 0 points  Repeat phrase 4 points  Total Score 4    Immunizations Immunization History  Administered Date(s) Administered   H1N1 07/13/2008   Influenza, High Dose Seasonal PF 06/11/2019, 05/15/2020   Influenza-Unspecified 06/04/2018   PFIZER SARS-COV-2 Vaccination 10/11/2019,  11/01/2019   Pneumococcal Conjugate-13 12/18/2015   Pneumococcal Polysaccharide-23 12/23/2016   Td 03/21/1999, 11/25/2003   Zoster 10/22/2010    TDAP status: Due, Education has  been provided regarding the importance of this vaccine. Advised may receive this vaccine at local pharmacy or Health Dept. Aware to provide a copy of the vaccination record if obtained from local pharmacy or Health Dept. Verbalized acceptance and understanding. Flu Vaccine status: Up to date Pneumococcal vaccine status: Up to date Covid-19 vaccine status: Completed vaccines  Qualifies for Shingles Vaccine? Yes   Zostavax completed Yes   Shingrix Completed?: No.    Education has been provided regarding the importance of this vaccine. Patient has been advised to call insurance company to determine out of pocket expense if they have not yet received this vaccine. Advised may also receive vaccine at local pharmacy or Health Dept. Verbalized acceptance and understanding.  Screening Tests Health Maintenance  Topic Date Due   TETANUS/TDAP  05/17/2021 (Originally 11/24/2013)   COLONOSCOPY  04/16/2022   INFLUENZA VACCINE  Completed   COVID-19 Vaccine  Completed   Hepatitis C Screening  Completed   PNA vac Low Risk Adult  Completed    Health Maintenance  There are no preventive care reminders to display for this patient.  Colorectal cancer screening: Completed 04/16/17. Repeat every 5 years  Lung Cancer Screening: (Low Dose CT Chest recommended if Age 85-80 years, 30 pack-year currently smoking OR have quit w/in 15years.) does not qualify.   Additional Screening:  Hepatitis C Screening: Up to date  Vision Screening: Recommended annual ophthalmology exams for early detection of glaucoma and other disorders of the eye. Is the patient up to date with their annual eye exam?  Yes  Who is the provider or what is the name of the office in which the patient attends annual eye exams? Dr Neville Route @ [facility] If pt is not  established with a provider, would they like to be referred to a provider to establish care? No .   Dental Screening: Recommended annual dental exams for proper oral hygiene  Community Resource Referral / Chronic Care Management: CRR required this visit?  No   CCM required this visit?  No      Plan:     I have personally reviewed and noted the following in the patients chart:    Medical and social history  Use of alcohol, tobacco or illicit drugs   Current medications and supplements  Functional ability and status  Nutritional status  Physical activity  Advanced directives  List of other physicians  Hospitalizations, surgeries, and ER visits in previous 12 months  Vitals  Screenings to include cognitive, depression, and falls  Referrals and appointments  In addition, I have reviewed and discussed with patient certain preventive protocols, quality metrics, and best practice recommendations. A written personalized care plan for preventive services as well as general preventive health recommendations were provided to patient.      [place], [place]   7/74/1287   Nurse Notes: None.

## 2020-05-17 ENCOUNTER — Other Ambulatory Visit: Payer: Self-pay

## 2020-05-17 ENCOUNTER — Ambulatory Visit (INDEPENDENT_AMBULATORY_CARE_PROVIDER_SITE_OTHER): Payer: PPO

## 2020-05-17 DIAGNOSIS — Z0000 Encounter for general adult medical examination without abnormal findings: Secondary | ICD-10-CM | POA: Diagnosis not present

## 2020-05-17 NOTE — Patient Instructions (Signed)
[name] , Thank you for taking time to come for your Medicare Wellness Visit. I appreciate your ongoing commitment to your health goals. Please review the following plan we discussed and let me know if I can assist you in the future.   Screening recommendations/referrals: Colonoscopy: Up to date, due 03/2022 Recommended yearly ophthalmology/optometry visit for glaucoma screening and checkup Recommended yearly dental visit for hygiene and checkup  Vaccinations: Influenza vaccine: Currently due. Pneumococcal vaccine: Completed series Tdap vaccine: Currently due, declined at this time. Shingles vaccine: Shingrix discussed. Please contact your pharmacy for coverage information.     Advanced directives: Please bring a copy of your POA (Power of Attorney) and/or Living Will to your next appointment.   Conditions/risks identified: Recommend to increase water intake to 6-8 8 oz glasses a day.   Next appointment: 08/08/20 @ 9:00 AM with Dr Derek Jackson   Preventive Care 70 Years and Older, Male Preventive care refers to lifestyle choices and visits with your health care provider that can promote health and wellness. What does preventive care include?  A yearly physical exam. This is also called an annual well check.  Dental exams once or twice a year.  Routine eye exams. Ask your health care provider how often you should have your eyes checked.  Personal lifestyle choices, including:  Daily care of your teeth and gums.  Regular physical activity.  Eating a healthy diet.  Avoiding tobacco and drug use.  Limiting alcohol use.  Practicing safe sex.  Taking low doses of aspirin every day.  Taking vitamin and mineral supplements as recommended by your health care provider. What happens during an annual well check? The services and screenings done by your health care provider during your annual well check will depend on your age, overall health, lifestyle risk factors, and family history  of disease. Counseling  Your health care provider may ask you questions about your:  Alcohol use.  Tobacco use.  Drug use.  Emotional well-being.  Home and relationship well-being.  Sexual activity.  Eating habits.  History of falls.  Memory and ability to understand (cognition).  Work and work Statistician. Screening  You may have the following tests or measurements:  Height, weight, and BMI.  Blood pressure.  Lipid and cholesterol levels. These may be checked every 5 years, or more frequently if you are over 59 years old.  Skin check.  Lung cancer screening. You may have this screening every year starting at age 31 if you have a 30-pack-year history of smoking and currently smoke or have quit within the past 15 years.  Fecal occult blood test (FOBT) of the stool. You may have this test every year starting at age 1.  Flexible sigmoidoscopy or colonoscopy. You may have a sigmoidoscopy every 5 years or a colonoscopy every 10 years starting at age 55.  Prostate cancer screening. Recommendations will vary depending on your family history and other risks.  Hepatitis C blood test.  Hepatitis B blood test.  Sexually transmitted disease (STD) testing.  Diabetes screening. This is done by checking your blood sugar (glucose) after you have not eaten for a while (fasting). You may have this done every 1-3 years.  Abdominal aortic aneurysm (AAA) screening. You may need this if you are a current or former smoker.  Osteoporosis. You may be screened starting at age 51 if you are at high risk. Talk with your health care provider about your test results, treatment options, and if necessary, the need for more tests.  Vaccines  Your health care provider may recommend certain vaccines, such as:  Influenza vaccine. This is recommended every year.  Tetanus, diphtheria, and acellular pertussis (Tdap, Td) vaccine. You may need a Td booster every 10 years.  Zoster vaccine. You may  need this after age 55.  Pneumococcal 13-valent conjugate (PCV13) vaccine. One dose is recommended after age 64.  Pneumococcal polysaccharide (PPSV23) vaccine. One dose is recommended after age 43. Talk to your health care provider about which screenings and vaccines you need and how often you need them. This information is not intended to replace advice given to you by your health care provider. Make sure you discuss any questions you have with your health care provider. Document Released: 09/08/2015 Document Revised: 05/01/2016 Document Reviewed: 06/13/2015 Elsevier Interactive Patient Education  2017 [place] Prevention in the Home Falls can cause injuries. They can happen to people of all ages. There are many things you can do to make your home safe and to help prevent falls. What can I do on the outside of my home?  Regularly fix the edges of walkways and driveways and fix any cracks.  Remove anything that might make you trip as you walk through a door, such as a raised step or threshold.  Trim any bushes or trees on the path to your home.  Use bright outdoor lighting.  Clear any walking paths of anything that might make someone trip, such as rocks or tools.  Regularly check to see if handrails are loose or broken. Make sure that both sides of any steps have handrails.  Any raised decks and porches should have guardrails on the edges.  Have any leaves, snow, or ice cleared regularly.  Use sand or salt on walking paths during winter.  Clean up any spills in your garage right away. This includes oil or grease spills. What can I do in the bathroom?  Use night lights.  Install grab bars by the toilet and in the tub and shower. Do not use towel bars as grab bars.  Use non-skid mats or decals in the tub or shower.  If you need to sit down in the shower, use a plastic, non-slip stool.  Keep the floor dry. Clean up any water that spills on the floor as soon as it  happens.  Remove soap buildup in the tub or shower regularly.  Attach bath mats securely with double-sided non-slip rug tape.  Do not have throw rugs and other things on the floor that can make you trip. What can I do in the bedroom?  Use night lights.  Make sure that you have a light by your bed that is easy to reach.  Do not use any sheets or blankets that are too big for your bed. They should not hang down onto the floor.  Have a firm chair that has side arms. You can use this for support while you get dressed.  Do not have throw rugs and other things on the floor that can make you trip. What can I do in the kitchen?  Clean up any spills right away.  Avoid walking on wet floors.  Keep items that you use a lot in easy-to-reach places.  If you need to reach something above you, use a strong step stool that has a grab bar.  Keep electrical cords out of the way.  Do not use floor polish or wax that makes floors slippery. If you must use wax, use non-skid floor wax.  Do not have throw rugs and other things on the floor that can make you trip. What can I do with my stairs?  Do not leave any items on the stairs.  Make sure that there are handrails on both sides of the stairs and use them. Fix handrails that are broken or loose. Make sure that handrails are as long as the stairways.  Check any carpeting to make sure that it is firmly attached to the stairs. Fix any carpet that is loose or worn.  Avoid having throw rugs at the top or bottom of the stairs. If you do have throw rugs, attach them to the floor with carpet tape.  Make sure that you have a light switch at the top of the stairs and the bottom of the stairs. If you do not have them, ask someone to add them for you. What else can I do to help prevent falls?  Wear shoes that:  Do not have high heels.  Have rubber bottoms.  Are comfortable and fit you well.  Are closed at the toe. Do not wear sandals.  If you  use a stepladder:  Make sure that it is fully opened. Do not climb a closed stepladder.  Make sure that both sides of the stepladder are locked into place.  Ask someone to hold it for you, if possible.  Clearly mark and make sure that you can see:  Any grab bars or handrails.  First and last steps.  Where the edge of each step is.  Use tools that help you move around (mobility aids) if they are needed. These include:  Canes.  Walkers.  Scooters.  Crutches.  Turn on the lights when you go into a dark area. Replace any light bulbs as soon as they burn out.  Set up your furniture so you have a clear path. Avoid moving your furniture around.  If any of your floors are uneven, fix them.  If there are any pets around you, be aware of where they are.  Review your medicines with your doctor. Some medicines can make you feel dizzy. This can increase your chance of falling. Ask your doctor what other things that you can do to help prevent falls. This information is not intended to replace advice given to you by your health care provider. Make sure you discuss any questions you have with your health care provider. Document Released: 06/08/2009 Document Revised: 01/18/2016 Document Reviewed: 09/16/2014 Elsevier Interactive Patient Education  2017 [employer].

## 2020-05-29 ENCOUNTER — Other Ambulatory Visit: Payer: Self-pay | Admitting: Family Medicine

## 2020-05-29 DIAGNOSIS — R3911 Hesitancy of micturition: Secondary | ICD-10-CM

## 2020-05-29 DIAGNOSIS — E782 Mixed hyperlipidemia: Secondary | ICD-10-CM | POA: Diagnosis not present

## 2020-05-29 DIAGNOSIS — I10 Essential (primary) hypertension: Secondary | ICD-10-CM | POA: Diagnosis not present

## 2020-05-29 DIAGNOSIS — I5022 Chronic systolic (congestive) heart failure: Secondary | ICD-10-CM | POA: Diagnosis not present

## 2020-05-29 DIAGNOSIS — R9431 Abnormal electrocardiogram [ECG] [EKG]: Secondary | ICD-10-CM | POA: Diagnosis not present

## 2020-05-29 DIAGNOSIS — K2100 Gastro-esophageal reflux disease with esophagitis, without bleeding: Secondary | ICD-10-CM

## 2020-07-15 IMAGING — CR DG CHEST 2V
1 series · 2 of 2 positions shown · non-contrast
Comparison: 07/06/2014

CLINICAL DATA: Cough, shortness of breath

EXAM:
CHEST - 2 VIEW

[Series 1: dg chest 2 view · 0.14mm/px · 2 of 2 slices shown]
[im 1/2]
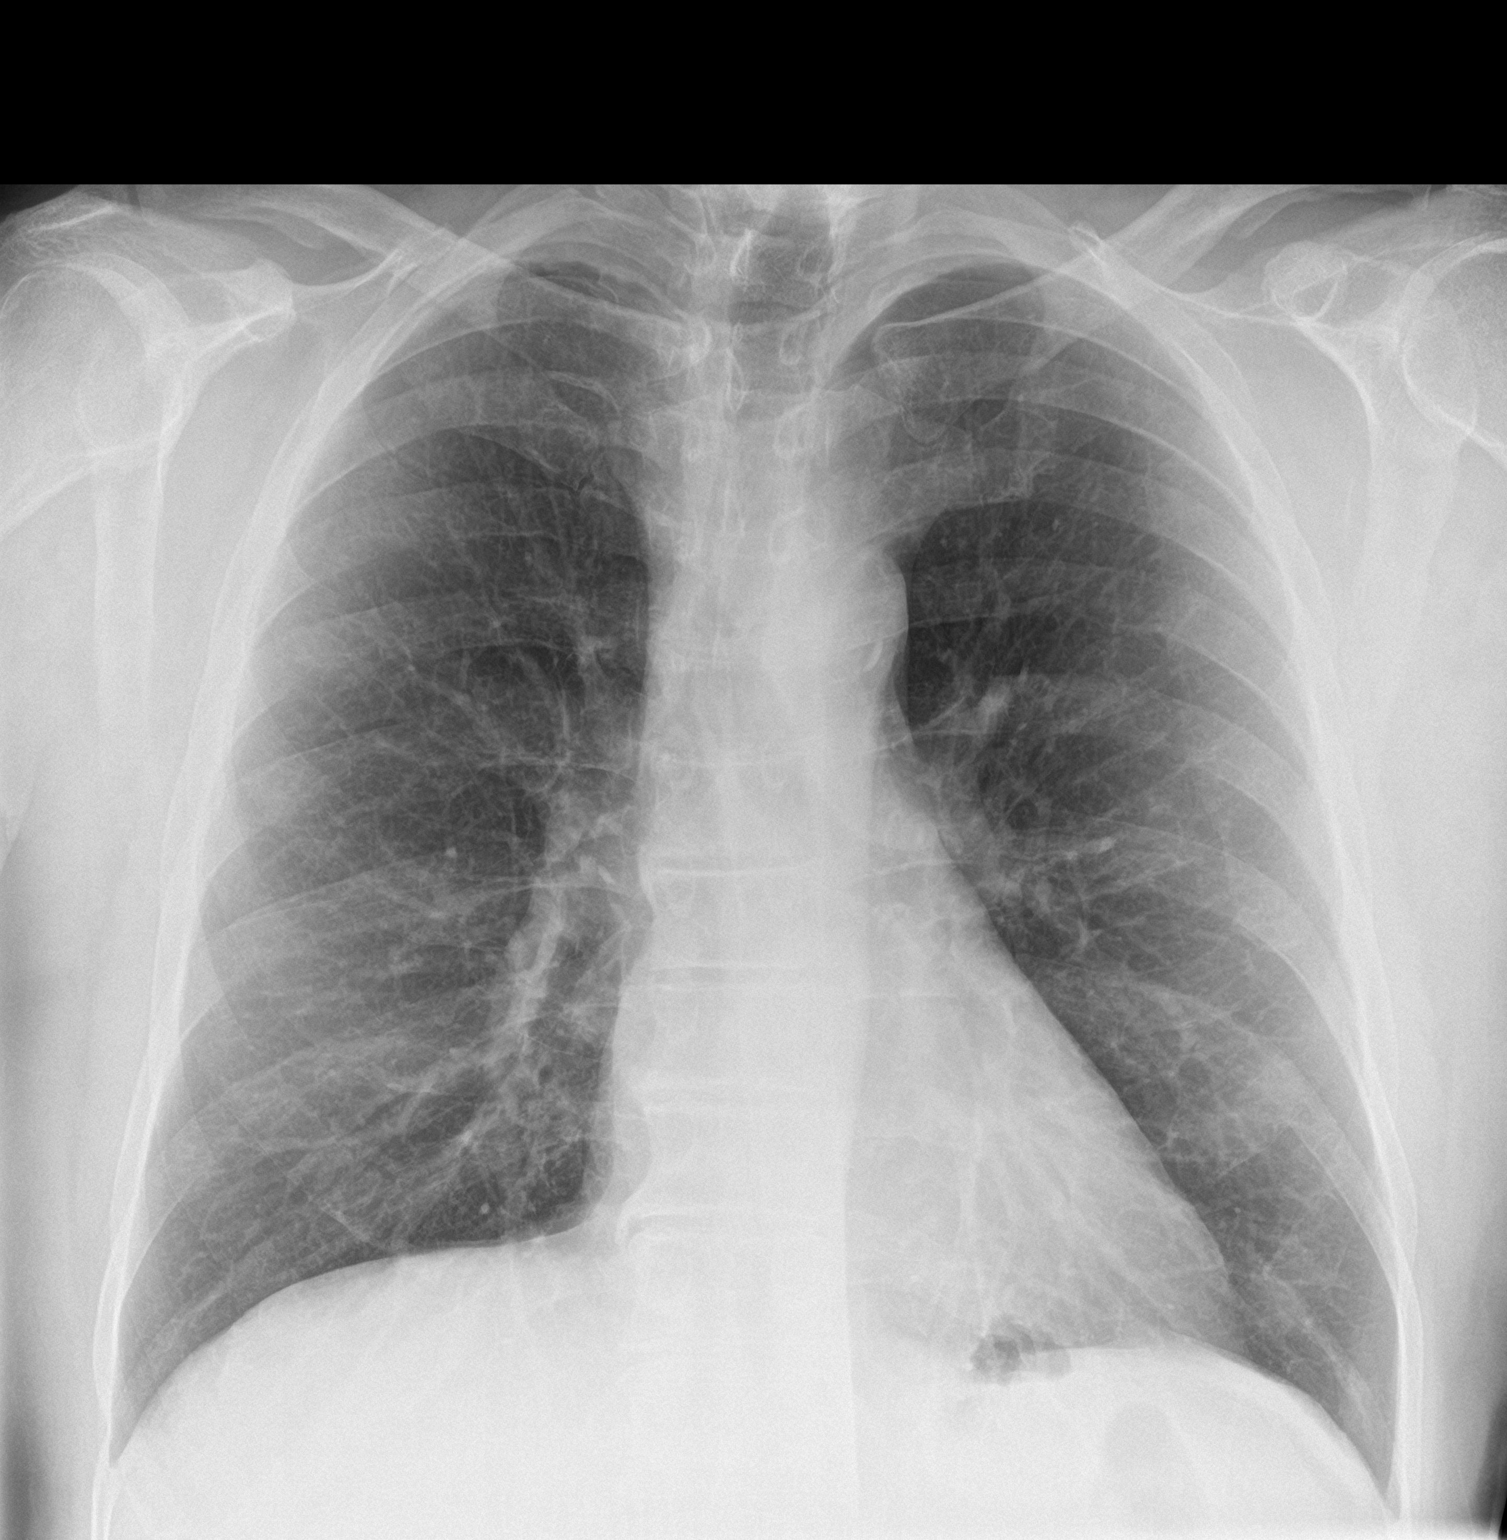
[im 2/2]
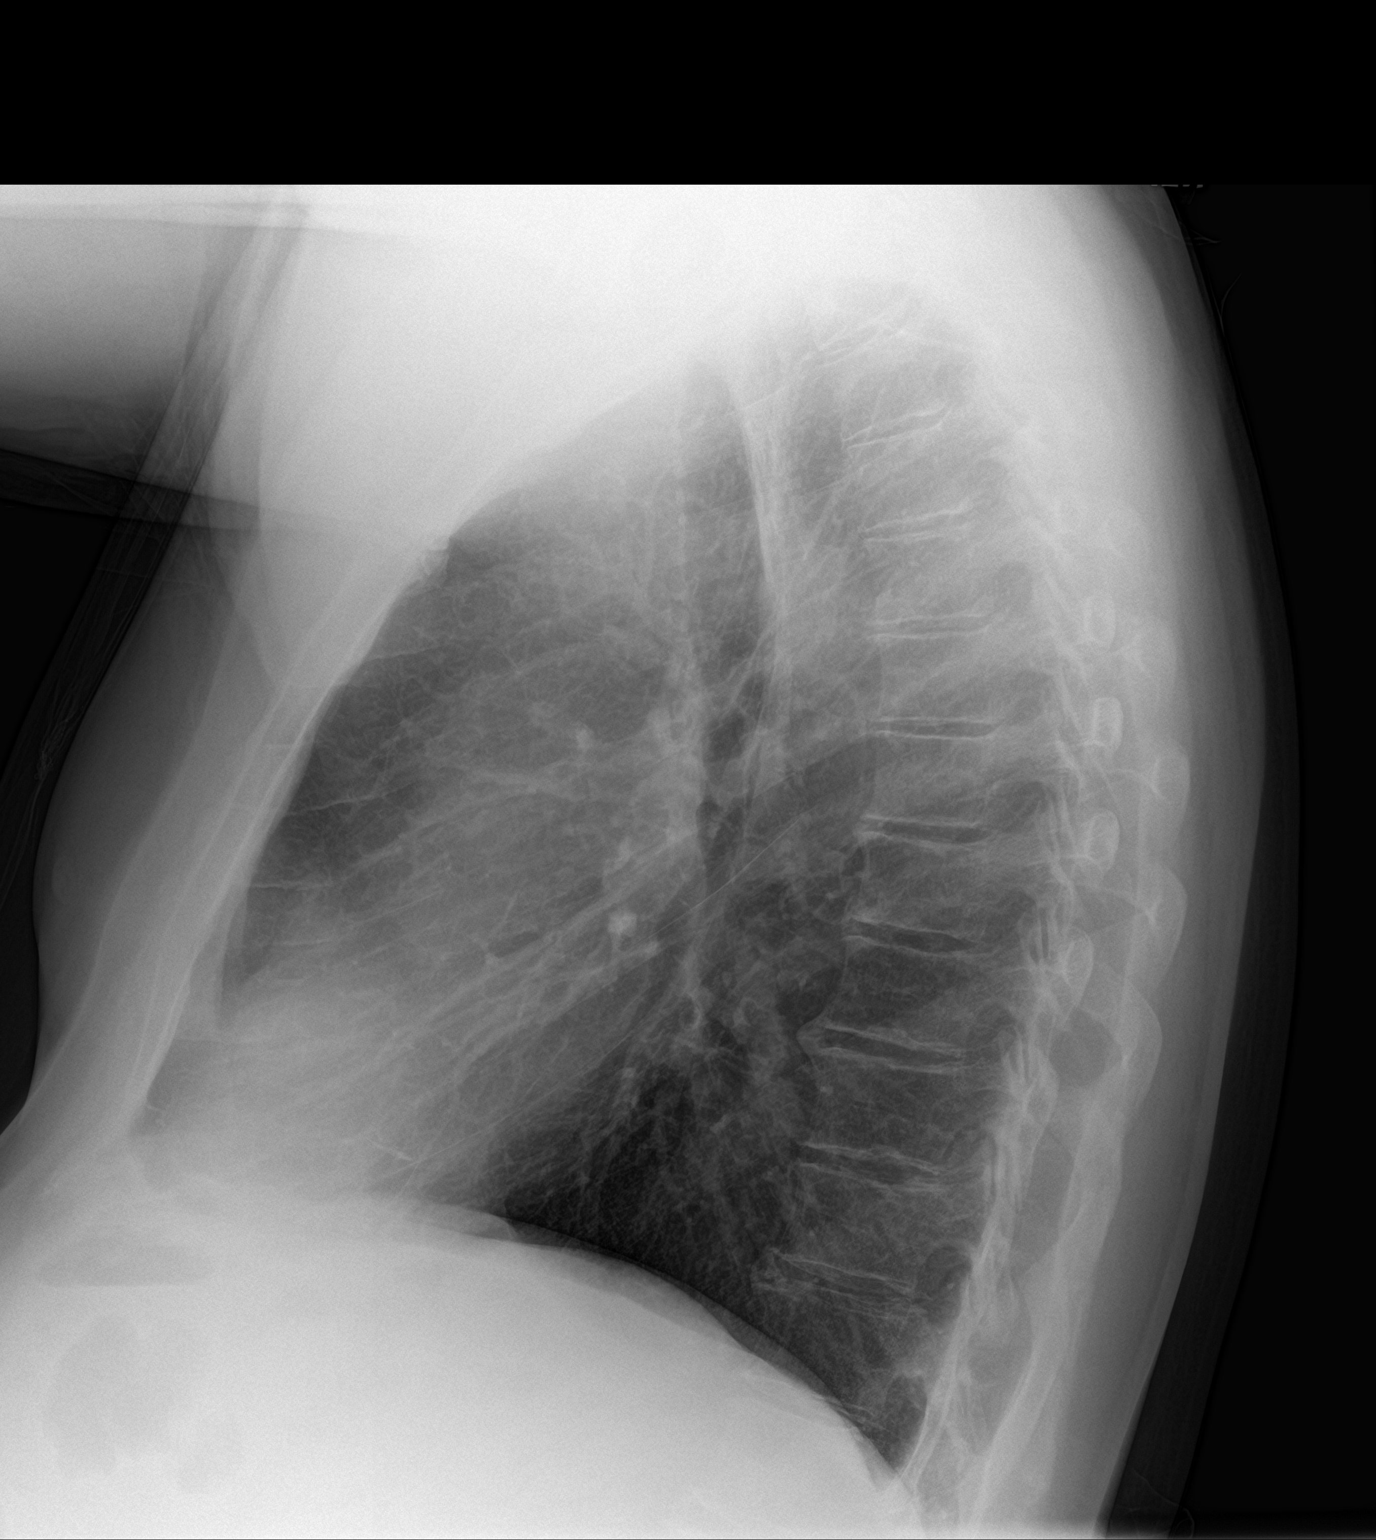

[2 of 2 positions shown; findings below may reference images not displayed]

FINDINGS: Heart and mediastinal contours are within normal limits. No focal
opacities or effusions. No acute bony abnormality.
IMPRESSION: No active cardiopulmonary disease.

## 2020-07-27 DIAGNOSIS — H401132 Primary open-angle glaucoma, bilateral, moderate stage: Secondary | ICD-10-CM | POA: Diagnosis not present

## 2020-08-08 ENCOUNTER — Other Ambulatory Visit: Payer: Self-pay

## 2020-08-08 ENCOUNTER — Ambulatory Visit (INDEPENDENT_AMBULATORY_CARE_PROVIDER_SITE_OTHER): Payer: PPO | Admitting: Family Medicine

## 2020-08-08 ENCOUNTER — Encounter: Payer: Self-pay | Admitting: Family Medicine

## 2020-08-08 VITALS — BP 118/73 | HR 60 | Temp 97.9°F | Resp 16 | Ht 73.0 in | Wt 223.4 lb

## 2020-08-08 DIAGNOSIS — R7303 Prediabetes: Secondary | ICD-10-CM

## 2020-08-08 DIAGNOSIS — Z0000 Encounter for general adult medical examination without abnormal findings: Secondary | ICD-10-CM | POA: Diagnosis not present

## 2020-08-08 DIAGNOSIS — H409 Unspecified glaucoma: Secondary | ICD-10-CM | POA: Insufficient documentation

## 2020-08-08 LAB — POCT GLYCOSYLATED HEMOGLOBIN (HGB A1C)
Est. average glucose Bld gHb Est-mCnc: 128
Hemoglobin A1C: 6.1 % — AB (ref 4.0–5.6)

## 2020-08-08 NOTE — Patient Instructions (Addendum)
.   Please review the attached list of medications and notify my office if there are any errors.   It is recommended to engage in 150 minutes of moderate exercise every week.    Avoid sweets and white starchy foods as much as possible, and try to get weight below 190 over the next six months.

## 2020-08-08 NOTE — Progress Notes (Signed)
Complete physical exam   Patient: Derek Jackson   DOB: Sep 28, 1950   69 y.o. Male  MRN: [id number] Visit Date: 08/08/2020  Today's healthcare provider: Lelon Huh, MD   Chief Complaint  Patient presents with  . Annual Exam  . Hypertension  . Hyperlipidemia   Subjective    RANSOME HELWIG is a 69 y.o. male who presents today for a complete physical exam.  He reports consuming a general diet. Home exercise routine includes walking 2 miles daily. He generally feels fairly well. He reports sleeping fairly well. He does not have additional problems to discuss today.   Had AWV with [facility] on 05/17/2020.   HPI  Hypertension, follow-up  BP Readings from Last 3 Encounters:  08/08/20 118/73  02/21/20 116/71  01/17/20 113/71   Wt Readings from Last 3 Encounters:  08/08/20 223 lb 6.4 oz (101.3 kg)  02/21/20 222 lb 12.8 oz (101.1 kg)  01/17/20 225 lb 6.4 oz (102.2 kg)     He was last seen for hypertension 1 year ago.  BP at that visit was 120/70. Management since that visit includes continue same medications.  He reports good compliance with treatment. He is not having side effects.  He is following a Regular diet. He is exercising. He does not smoke.  Use of agents associated with hypertension: none.   Outside blood pressures are not checked. Symptoms: No chest pain No chest pressure  No palpitations No syncope  No dyspnea No orthopnea  No paroxysmal nocturnal dyspnea No lower extremity edema   Pertinent labs: Lab Results  Component Value Date   CHOL 114 01/17/2020   HDL 41 01/17/2020   LDLCALC 51 01/17/2020   TRIG 125 01/17/2020   CHOLHDL 2.8 01/17/2020   Lab Results  Component Value Date   NA 137 01/17/2020   K 4.7 01/17/2020   CREATININE 0.83 01/17/2020   GFRNONAA 90 01/17/2020   GFRAA 104 01/17/2020   GLUCOSE 125 (H) 01/17/2020     The ASCVD Risk score (Goff DC Jr., et al., 2013) failed to calculate for the following reasons:   The valid total  cholesterol range is 130 to 320 mg/dL   ---------------------------------------------------------------------------------------------------  Lipid/Cholesterol, Follow-up  Last lipid panel Other pertinent labs  Lab Results  Component Value Date   CHOL 114 01/17/2020   HDL 41 01/17/2020   LDLCALC 51 01/17/2020   TRIG 125 01/17/2020   CHOLHDL 2.8 01/17/2020   Lab Results  Component Value Date   ALT 46 (H) 01/17/2020   AST 34 01/17/2020   PLT 301 06/16/2019   TSH 1.390 06/16/2019     He was last seen for this 7 months ago.  Management since that visit includes continue same medications.  He reports good compliance with treatment. He is not having side effects.   Symptoms: No chest pain No chest pressure/discomfort  No dyspnea No lower extremity edema  No numbness or tingling of extremity No orthopnea  No palpitations No paroxysmal nocturnal dyspnea  No speech difficulty No syncope   Current diet: well balanced Current exercise: walking  The ASCVD Risk score Mikey Bussing DC Jr., et al., 2013) failed to calculate for the following reasons:   The valid total cholesterol range is 130 to 320 mg/dL  ---------------------------------------------------------------------------------------------------  Past Medical History:  Diagnosis Date  . BPH (benign prostatic hypertrophy)   . COVID-19 04/15/2019  . Esophagitis, reflux   . Glaucoma   . History of shingles 04/27/2015  . Kidney stone   .  Rosacea   . Systolic CHF ([place]) 2841   Past Surgical History:  Procedure Laterality Date  . Abdominal wall mass excision  2010   Dr. Bary Castilla  . CARDIAC CATHETERIZATION  07/2014   No blockages. per patient one side of heart was not functioning well, CHF-Dr. Nehemiah Massed  . CHOLECYSTECTOMY  2005   Lapraroscopic  . COLONOSCOPY  [id number]  . COLONOSCOPY WITH PROPOFOL N/A 04/16/2017   Procedure: COLONOSCOPY WITH PROPOFOL;  Surgeon: Robert Bellow, MD;  Location: [facility] ENDOSCOPY;  Service:  Endoscopy;  Laterality: N/A;  . CT Scan of Abdomen  12/05/2008   Soft Tissue mass 2.42cm x 3.98 cm of Abdominal wall lateral to umbilicus, pathology shows fibromatosis. Multiple large liver cysts up to 9cm x 9cm  . KNEE SURGERY  1994   Arthroscopy. Surgery by Dr. Marry Guan  . RIGHT COLECTOMY  2006   Dr.Byrnett  . TONSILLECTOMY AND ADENOIDECTOMY  1960's  . UPPER GI ENDOSCOPY  2010   Social History   Socioeconomic History  . Marital status: Significant Other    Spouse name: Not on file  . Number of children: 3  . Years of education: Not on file  . Highest education level: Some college, no degree  Occupational History  . Occupation: Employed    Comment: Works at [employer]: part time  Tobacco Use  . Smoking status: Former Smoker    Packs/day: 1.00    Years: 8.00    Pack years: 8.00    Types: Cigarettes    Quit date: 04/26/1990    Years since quitting: 30.3  . Smokeless tobacco: Never Used  Vaping Use  . Vaping Use: Never used  Substance and Sexual Activity  . Alcohol use: Yes    Alcohol/week: 0.0 standard drinks    Comment: 6 pack a month/socially  . Drug use: No  . Sexual activity: Yes    Birth control/protection: None  Other Topics Concern  . Not on file  Social History Narrative   Retired April 2017, still working 3 days a week   Social Determinants of Radio broadcast assistant Strain: Low Risk   . Difficulty of Paying Living Expenses: Not hard at all  Food Insecurity: No Food Insecurity  . Worried About Charity fundraiser in the Last Year: Never true  . Ran Out of Food in the Last Year: Never true  Transportation Needs: No Transportation Needs  . Lack of Transportation (Medical): No  . Lack of Transportation (Non-Medical): No  Physical Activity: Sufficiently Active  . Days of Exercise per Week: 6 days  . Minutes of Exercise per Session: 40 min  Stress: No Stress Concern Present  . Feeling of Stress : Only a little  Social  Connections: Moderately Isolated  . Frequency of Communication with Friends and Family: Three times a week  . Frequency of Social Gatherings with Friends and Family: More than three times a week  . Attends Religious Services: Never  . Active Member of Clubs or Organizations: No  . Attends Archivist Meetings: Never  . Marital Status: Living with partner  Intimate Partner Violence: Not At Risk  . Fear of Current or Ex-Partner: No  . Emotionally Abused: No  . Physically Abused: No  . Sexually Abused: No   Family Status  Relation Name Status  . Father  Deceased at age 62       Cause of death: CVA  . Mother  Deceased at age 58  Cause of Death: CHF. was also prediabetic  . MGF  Deceased       MI  . PGM  Deceased       Cause of death: Complications of Diabetes  . PGF  Deceased       MI  . Brother  Deceased at age 98       Cause of Death: Heart Disease  . Sister  Alive  . Brother  Alive       PRE- Diabetes  . Daughter  Alive       has had surgery for VSD  . MGM  Deceased       OLD age  . Neg Hx  (Not Specified)   Family History  Problem Relation Age of Onset  . Colon polyps Father   . Diabetes Father   . CAD Father        had CABG  . Stroke Father   . Dementia Father   . Congestive Heart Failure Mother   . Heart attack Maternal Grandfather   . Diabetes Paternal Grandmother   . Heart attack Paternal Grandfather   . Heart disease Brother        also had history of VSD  . Aneurysm Brother   . Prostate cancer Neg Hx   . Bladder Cancer Neg Hx   . Kidney cancer Neg Hx    No Known Allergies  Patient Care Team: Birdie Sons, MD as PCP - General (Family Medicine) Byrnett, [name], MD (General Surgery) Abbie Sons, MD as Consulting Physician (Urology) Corey Skains, MD as Consulting Physician (Cardiology) [place], [place] [facility]) Marchia Meiers, MD as Consulting Physician (Ophthalmology) Oneta Rack, MD (Dermatology)    Medications: Outpatient Medications Prior to Visit  Medication Sig  . atorvastatin (LIPITOR) 40 MG tablet TAKE 1 TABLET BY MOUTH ONCE DAILY (Patient taking differently: 20 mg.)  . ferrous sulfate 325 (65 FE) MG tablet Take by mouth 3 (three) times a week.  . latanoprost (XALATAN) 0.005 % ophthalmic solution Place 1 drop into both eyes at bedtime.  Marland Kitchen lisinopril (PRINIVIL,ZESTRIL) 20 MG tablet Take 1 tablet by mouth daily.  . metoprolol tartrate (LOPRESSOR) 25 MG tablet Take 1 tablet by mouth 2 (two) times daily.  . Multiple Vitamin (MULTIVITAMIN) tablet Take 1 tablet by mouth daily.  Marland Kitchen omeprazole (PRILOSEC) 40 MG capsule TAKE 1 CAPSULE BY MOUTH ONCE DAILY  . spironolactone (ALDACTONE) 25 MG tablet Take 12.5 mg by mouth daily.   . tamsulosin (FLOMAX) 0.4 MG CAPS capsule TAKE 1 CAPSULE BY MOUTH ONCE DAILY   No facility-administered medications prior to visit.    Review of Systems  Constitutional: Negative for appetite change, chills, fatigue and fever.  HENT: Positive for tinnitus. Negative for congestion, ear pain, hearing loss, nosebleeds and trouble swallowing.   Eyes: Negative for pain and visual disturbance.  Respiratory: Positive for shortness of breath. Negative for cough and chest tightness.   Cardiovascular: Negative for chest pain, palpitations and leg swelling.  Gastrointestinal: Negative for abdominal pain, blood in stool, constipation, diarrhea, nausea and vomiting.  Endocrine: Negative for polydipsia, polyphagia and polyuria.  Genitourinary: Positive for frequency. Negative for dysuria and flank pain.  Musculoskeletal: Positive for myalgias. Negative for arthralgias, back pain, joint swelling and neck stiffness.  Skin: Negative for color change, rash and wound.  Neurological: Positive for dizziness. Negative for tremors, seizures, speech difficulty, weakness, light-headedness and headaches.  Psychiatric/Behavioral: Negative for behavioral problems, confusion, decreased  concentration, dysphoric mood and  sleep disturbance. The patient is not nervous/anxious.   All other systems reviewed and are negative.     Objective    BP 118/73 (BP Location: Right Arm, Patient Position: Sitting, Cuff Size: Large)   Pulse 60   Temp 97.9 F (36.6 C) (Oral)   Resp 16   Ht 6\' 1"  (1.854 m)   Wt 223 lb 6.4 oz (101.3 kg)   BMI 29.47 kg/m    Physical Exam   General Appearance:     Overweight male. Alert, cooperative, in no acute distress, appears stated age  Head:    Normocephalic, without obvious abnormality, atraumatic  Eyes:    PERRL, conjunctiva/corneas clear, EOM's intact, fundi    benign, both eyes       Back:     Symmetric, no curvature, ROM normal, no CVA tenderness  Lungs:     Clear to auscultation bilaterally, respirations unlabored  Chest wall:    No tenderness or deformity  Heart:    Normal heart rate. Normal rhythm. No murmurs, rubs, or gallops.  S1 and S2 normal  Abdomen:     Soft, non-tender, bowel sounds active all four quadrants,    no masses, no organomegaly  Genitalia:    deferred  Rectal:    deferred  Extremities:   All extremities are intact. No cyanosis or edema  Pulses:   2+ and symmetric all extremities  Skin:   Skin color, texture, turgor normal, no rashes or lesions  Lymph nodes:   Cervical, supraclavicular, and axillary nodes normal  Neurologic:   CNII-XII intact. Normal strength, sensation and reflexes      throughout     Last depression screening scores PHQ 2/9 Scores 05/17/2020 03/29/2019 12/01/2017  PHQ - 2 Score 0 0 1  PHQ- 9 Score - - -   Last fall risk screening Fall Risk  05/17/2020  Falls in the past year? 0  Comment -  Number falls in past yr: 0  Injury with Fall? 0   Last Audit-C alcohol use screening Alcohol Use Disorder Test (AUDIT) 05/17/2020  1. How often do you have a drink containing alcohol? 2  2. How many drinks containing alcohol do you have on a typical day when you are drinking? 0  3. How often do you have  six or more drinks on one occasion? 0  AUDIT-C Score 2  Alcohol Brief Interventions/Follow-up AUDIT Score <7 follow-up not indicated   A score of 3 or more in women, and 4 or more in men indicates increased risk for alcohol abuse, EXCEPT if all of the points are from question 1   No results found for any visits on 08/08/20.  Assessment & Plan    Routine Health Maintenance and Physical Exam  Exercise Activities and Dietary recommendations Goals    . DIET - INCREASE WATER INTAKE     Recommend increasing water intake to 4 glasses a day.        Immunization History  Administered Date(s) Administered  . H1N1 07/13/2008  . Influenza, High Dose Seasonal PF 06/11/2019, 05/15/2020  . Influenza-Unspecified 06/04/2018  . PFIZER SARS-COV-2 Vaccination 10/11/2019, 11/01/2019, 06/12/2020  . Pneumococcal Conjugate-13 12/18/2015  . Pneumococcal Polysaccharide-23 12/23/2016  . Td 03/21/1999, 11/25/2003  . Zoster 10/22/2010    Health Maintenance  Topic Date Due  . TETANUS/TDAP  05/17/2021 (Originally 11/24/2013)  . COLONOSCOPY  04/16/2022  . INFLUENZA VACCINE  Completed  . COVID-19 Vaccine  Completed  . Hepatitis C Screening  Completed  . PNA vac  Low Risk Adult  Completed    Discussed health benefits of physical activity, and encouraged him to engage in regular exercise appropriate for his age and condition.  1. Annual physical exam Generally doing very well. Continue routine follow up cardiology.   2. Prediabetes Advised dietary changes, exercise and weight loss. Check A1c 6 months.       The entirety of the information documented in the History of Present Illness, Review of Systems and Physical Exam were personally obtained by me. Portions of this information were initially documented by the CMA and reviewed by me for thoroughness and accuracy.      Lelon Huh, MD  [facility] [phone] (phone) [phone] (fax)  [place]

## 2020-10-30 DIAGNOSIS — H401132 Primary open-angle glaucoma, bilateral, moderate stage: Secondary | ICD-10-CM | POA: Diagnosis not present

## 2020-11-08 DIAGNOSIS — D225 Melanocytic nevi of trunk: Secondary | ICD-10-CM | POA: Diagnosis not present

## 2020-11-08 DIAGNOSIS — D2261 Melanocytic nevi of right upper limb, including shoulder: Secondary | ICD-10-CM | POA: Diagnosis not present

## 2020-11-08 DIAGNOSIS — X32XXXA Exposure to sunlight, initial encounter: Secondary | ICD-10-CM | POA: Diagnosis not present

## 2020-11-08 DIAGNOSIS — Z85828 Personal history of other malignant neoplasm of skin: Secondary | ICD-10-CM | POA: Diagnosis not present

## 2020-11-08 DIAGNOSIS — D2271 Melanocytic nevi of right lower limb, including hip: Secondary | ICD-10-CM | POA: Diagnosis not present

## 2020-11-08 DIAGNOSIS — L570 Actinic keratosis: Secondary | ICD-10-CM | POA: Diagnosis not present

## 2020-11-08 DIAGNOSIS — D2262 Melanocytic nevi of left upper limb, including shoulder: Secondary | ICD-10-CM | POA: Diagnosis not present

## 2020-11-20 DIAGNOSIS — H401132 Primary open-angle glaucoma, bilateral, moderate stage: Secondary | ICD-10-CM | POA: Diagnosis not present

## 2020-11-27 DIAGNOSIS — E782 Mixed hyperlipidemia: Secondary | ICD-10-CM | POA: Diagnosis not present

## 2020-11-27 DIAGNOSIS — I447 Left bundle-branch block, unspecified: Secondary | ICD-10-CM | POA: Diagnosis not present

## 2020-11-27 DIAGNOSIS — I10 Essential (primary) hypertension: Secondary | ICD-10-CM | POA: Diagnosis not present

## 2020-11-27 DIAGNOSIS — I5022 Chronic systolic (congestive) heart failure: Secondary | ICD-10-CM | POA: Diagnosis not present

## 2020-12-04 DIAGNOSIS — H401132 Primary open-angle glaucoma, bilateral, moderate stage: Secondary | ICD-10-CM | POA: Diagnosis not present

## 2021-01-29 ENCOUNTER — Other Ambulatory Visit: Payer: Self-pay | Admitting: *Deleted

## 2021-01-29 DIAGNOSIS — R972 Elevated prostate specific antigen [PSA]: Secondary | ICD-10-CM

## 2021-02-07 ENCOUNTER — Ambulatory Visit: Payer: Self-pay | Admitting: Family Medicine

## 2021-02-19 ENCOUNTER — Other Ambulatory Visit: Payer: PPO

## 2021-02-19 ENCOUNTER — Other Ambulatory Visit: Payer: Self-pay

## 2021-02-19 DIAGNOSIS — R972 Elevated prostate specific antigen [PSA]: Secondary | ICD-10-CM

## 2021-02-20 LAB — PSA: Prostate Specific Ag, Serum: 3.1 ng/mL (ref 0.0–4.0)

## 2021-02-21 ENCOUNTER — Ambulatory Visit: Payer: Self-pay | Admitting: Urology

## 2021-02-22 ENCOUNTER — Other Ambulatory Visit: Payer: Self-pay

## 2021-02-22 ENCOUNTER — Ambulatory Visit: Payer: PPO | Admitting: Urology

## 2021-02-22 ENCOUNTER — Encounter: Payer: Self-pay | Admitting: Urology

## 2021-02-22 VITALS — BP 129/77 | HR 63 | Ht 73.0 in | Wt 212.0 lb

## 2021-02-22 DIAGNOSIS — Z87898 Personal history of other specified conditions: Secondary | ICD-10-CM

## 2021-02-22 DIAGNOSIS — N401 Enlarged prostate with lower urinary tract symptoms: Secondary | ICD-10-CM | POA: Diagnosis not present

## 2021-02-22 NOTE — Progress Notes (Signed)
02/22/2021 10:32 AM   Derek Jackson 08-08-1951 [id number]  Referring provider: Birdie Sons, [place] [address]  Chief Complaint  Patient presents with   Elevated PSA    Urologic history: 1.  Elevated PSA -Prostate biopsy 03/2018; PSA 4.7; volume 53 cc; benign pathology   2.  BPH with lower urinary tract symptoms -On tamsulosin  HPI: 70 y.o. male presents for annual follow-up.  No significant changes since last years visit Stable LUTS; nocturia x2 PSA drawn 627/22 stable at 3.1   PMH: Past Medical History:  Diagnosis Date   BPH (benign prostatic hypertrophy)    COVID-19 04/15/2019   Esophagitis, reflux    Glaucoma    History of shingles 04/27/2015   Kidney stone    Rosacea    Systolic CHF ([place]) 5093    Surgical History: Past Surgical History:  Procedure Laterality Date   Abdominal wall mass excision  2010   Dr. Bary Castilla   CARDIAC CATHETERIZATION  07/2014   No blockages. per patient one side of heart was not functioning well, CHF-Dr. Nehemiah Massed   CHOLECYSTECTOMY  2005   Lapraroscopic   COLONOSCOPY  [id number]   COLONOSCOPY WITH PROPOFOL N/A 04/16/2017   Procedure: COLONOSCOPY WITH PROPOFOL;  Surgeon: Robert Bellow, MD;  [facility] END[facility]COPY;  Service: Endoscopy;  Laterality: N/A;   CT Scan of Abdomen  12/05/2008   Soft Tissue mass 2.42cm x 3.98 cm of Abdominal wall lateral to umbilicus, pathology shows fibromatosis. Multiple large liver cysts up to 9cm x 9cm   KNEE SURGERY  1994   Arthroscopy. Surgery by Dr. Marry Guan   RIGHT COLECTOMY  2006   Dr.Byrnett   TONSILLECTOMY AND ADENOIDECTOMY  1960's   UPPER GI ENDOSCOPY  2010    Home Medications:  Allergies as of 02/22/2021   No Known Allergies      Medication List        Accurate as of February 22, 2021 10:32 AM. If you have any questions, ask your nurse or doctor.          STOP taking these medications    atorvastatin 40 MG tablet Commonly known as:  LIPITOR Stopped by: Abbie Sons, MD   ferrous sulfate 325 (65 FE) MG tablet Stopped by: Abbie Sons, MD   multivitamin tablet Stopped by: Abbie Sons, MD       TAKE these medications    Combigan 0.2-0.5 % ophthalmic solution Generic drug: brimonidine-timolol Place 1 drop into both eyes every 12 (twelve) hours.   latanoprost 0.005 % ophthalmic solution Commonly known as: XALATAN Place 1 drop into both eyes at bedtime.   lisinopril 20 MG tablet Commonly known as: ZESTRIL Take 1 tablet by mouth daily.   metoprolol tartrate 25 MG tablet Commonly known as: LOPRESSOR Take 1 tablet by mouth 2 (two) times daily.   omeprazole 40 MG capsule Commonly known as: PRILOSEC TAKE 1 CAPSULE BY MOUTH ONCE DAILY   rosuvastatin 5 MG tablet Commonly known as: CRESTOR Take 5 mg by mouth daily.   spironolactone 25 MG tablet Commonly known as: ALDACTONE Take 12.5 mg by mouth daily.   tamsulosin 0.4 MG Caps capsule Commonly known as: FLOMAX TAKE 1 CAPSULE BY MOUTH ONCE DAILY        Allergies: No Known Allergies  Family History: Family History  Problem Relation Age of Onset   Colon polyps Father    Diabetes Father    CAD Father  had CABG   Stroke Father    Dementia Father    Congestive Heart Failure Mother    Heart attack Maternal Grandfather    Diabetes Paternal Grandmother    Heart attack Paternal Grandfather    Heart disease Brother        also had history of VSD   Aneurysm Brother    Prostate cancer Neg Hx    Bladder Cancer Neg Hx    Kidney cancer Neg Hx     Social History:  reports that he quit smoking about 30 years ago. His smoking use included cigarettes. He has a 8.00 pack-year smoking history. He has never used smokeless tobacco. He reports current alcohol use. He reports that he does not use drugs.   Physical Exam: BP 129/77   Pulse 63   Ht 6\' 1"  (1.854 m)   Wt 212 lb (96.2 kg)   BMI 27.97 kg/m   Constitutional:  Alert and oriented,  No acute distress. HEENT: [place] AT, moist mucus membranes.  Trachea midline, no masses. Cardiovascular: No clubbing, cyanosis, or edema. Respiratory: Normal respiratory effort, no increased work of breathing. GU: Every other year DRE Neurologic: Grossly intact, no focal deficits, moving all 4 extremities. Psychiatric: Normal mood and affect.   Assessment & Plan:    1.  BPH with LUTS Stable on tamsulosin Has been refilled by Dr. Caryn Section  2.  History elevated PSA Stable PSA Follow-up 1 year with [place], MD  [facility] [address], [place], [place] [address] [phone]

## 2021-02-26 ENCOUNTER — Encounter: Payer: Self-pay | Admitting: Urology

## 2021-03-26 ENCOUNTER — Other Ambulatory Visit: Payer: Self-pay

## 2021-03-26 ENCOUNTER — Ambulatory Visit (INDEPENDENT_AMBULATORY_CARE_PROVIDER_SITE_OTHER): Payer: PPO | Admitting: Family Medicine

## 2021-03-26 ENCOUNTER — Encounter: Payer: Self-pay | Admitting: Family Medicine

## 2021-03-26 VITALS — BP 126/72 | HR 62 | Wt 216.0 lb

## 2021-03-26 DIAGNOSIS — I10 Essential (primary) hypertension: Secondary | ICD-10-CM

## 2021-03-26 DIAGNOSIS — I5022 Chronic systolic (congestive) heart failure: Secondary | ICD-10-CM

## 2021-03-26 DIAGNOSIS — R7303 Prediabetes: Secondary | ICD-10-CM

## 2021-03-26 DIAGNOSIS — E782 Mixed hyperlipidemia: Secondary | ICD-10-CM

## 2021-03-26 LAB — POCT GLYCOSYLATED HEMOGLOBIN (HGB A1C): Hemoglobin A1C: 5.9 % — AB (ref 4.0–5.6)

## 2021-03-26 NOTE — Patient Instructions (Addendum)
.   Please review the attached list of medications and notify my office if there are any errors.   The CDC recommends two doses of Shingrix (the shingles vaccine) separated by 2 to 6 months for adults age 71 years and older. I recommend checking with your pharmacy plan regarding coverage for this vaccine.     You are due for a Tdap (tetanus-diptheria-pertussis vaccine) which protects you from tetanus and whooping cough. Please check with your insurance plan or pharmacy regarding coverage for this vaccine.   

## 2021-03-26 NOTE — Progress Notes (Signed)
Established patient visit   Patient: Derek Jackson   DOB: Jun 10, 1951   70 y.o. Male  MRN: [id number] Visit Date: 03/26/2021  Today's healthcare provider: Lelon Huh, MD   Chief Complaint  Patient presents with   Hyperglycemia   Back Pain   Subjective    Back Pain This is a new problem. The current episode started 1 to 4 weeks ago. The problem has been gradually improving since onset. The pain is present in the lumbar spine. The pain does not radiate. Pertinent negatives include no abdominal pain, bladder incontinence, bowel incontinence, chest pain, dysuria, fever, headaches, leg pain, numbness or weakness. Treatments tried: Tylenol. The treatment provided mild relief.   Prediabetes, Follow-up  Lab Results  Component Value Date   HGBA1C 6.1 (A) 08/08/2020   GLUCOSE 125 (H) 01/17/2020   GLUCOSE 113 (H) 06/16/2019   GLUCOSE 109 (H) 07/02/2018    Last seen for for this8 months ago.  Management since that visit includes; Advised dietary changes, exercise and weight loss. Check A1c 6 months.  Current symptoms include none and have been stable.  Current diet: in general, a "healthy" diet   Current exercise:  Exercises regularly  Pertinent Labs:    Component Value Date/Time   CHOL 114 01/17/2020 0830   TRIG 125 01/17/2020 0830   CHOLHDL 2.8 01/17/2020 0830   CREATININE 0.83 01/17/2020 0830    Wt Readings from Last 3 Encounters:  03/26/21 216 lb (98 kg)  02/22/21 212 lb (96.2 kg)  08/08/20 223 lb 6.4 oz (101.3 kg)    -----------------------------------------------------------------------------------------      Medications: Outpatient Medications Prior to Visit  Medication Sig   brimonidine-timolol (COMBIGAN) 0.2-0.5 % ophthalmic solution Place 1 drop into both eyes every 12 (twelve) hours.   latanoprost (XALATAN) 0.005 % ophthalmic solution Place 1 drop into both eyes at bedtime.   lisinopril (PRINIVIL,ZESTRIL) 20 MG tablet Take 1 tablet by mouth daily.    metoprolol tartrate (LOPRESSOR) 25 MG tablet Take 1 tablet by mouth 2 (two) times daily.   omeprazole (PRILOSEC) 40 MG capsule TAKE 1 CAPSULE BY MOUTH ONCE DAILY   rosuvastatin (CRESTOR) 5 MG tablet Take 5 mg by mouth daily.   spironolactone (ALDACTONE) 25 MG tablet Take 12.5 mg by mouth daily.    tamsulosin (FLOMAX) 0.4 MG CAPS capsule TAKE 1 CAPSULE BY MOUTH ONCE DAILY   No facility-administered medications prior to visit.    Review of Systems  Constitutional:  Negative for appetite change, chills and fever.  Respiratory: Negative.  Negative for chest tightness, shortness of breath and wheezing.   Cardiovascular: Negative.  Negative for chest pain and palpitations.  Gastrointestinal: Negative.  Negative for abdominal pain, bowel incontinence, nausea and vomiting.  Genitourinary:  Negative for bladder incontinence and dysuria.  Musculoskeletal:  Positive for back pain. Negative for arthralgias, gait problem, joint swelling, myalgias, neck pain and neck stiffness.  Skin:  Negative for wound.  Neurological:  Negative for dizziness, weakness, light-headedness, numbness and headaches.      Objective    BP 126/72 (BP Location: Right Arm, Patient Position: Sitting, Cuff Size: Large)   Pulse 62   Wt 216 lb (98 kg)   SpO2 97%   BMI 28.50 kg/m    Physical Exam   General: Appearance:     Well developed, well nourished male in no acute distress  Eyes:    PERRL, conjunctiva/corneas clear, EOM's intact       Lungs:     Clear  to auscultation bilaterally, respirations unlabored  Heart:    Normal heart rate. Normal rhythm. No murmurs, rubs, or gallops.    MS:   All extremities are intact.    Neurologic:   Awake, alert, oriented x 3. No apparent focal neurological defect.       Results for orders placed or performed in visit on 03/26/21  POCT glycosylated hemoglobin (Hb A1C)  Result Value Ref Range   Hemoglobin A1C 5.9 (A) 4.0 - 5.6 %    Assessment & Plan     1. Prediabetes Well  controlled with diet.   2. Chronic systolic congestive heart failure ([place]) Continues to follow up periodically with Dr. Nehemiah Massed. Currently asymptomatic.   3. Benign essential hypertension Well controlled.  Continue current medications.    4. Hyperlipemia, mixed He is tolerating rosuvastatin well with no adverse effects.   - Comprehensive metabolic panel - CBC - Lipid panel      The entirety of the information documented in the History of Present Illness, Review of Systems and Physical Exam were personally obtained by me. Portions of this information were initially documented by the CMA and reviewed by me for thoroughness and accuracy.     Lelon Huh, MD  [facility] [phone] (phone) [phone] (fax)  [place]

## 2021-03-27 LAB — CBC
Hematocrit: 43.8 % (ref 37.5–51.0)
Hemoglobin: 14.7 g/dL (ref 13.0–17.7)
MCH: 30 pg (ref 26.6–33.0)
MCHC: 33.6 g/dL (ref 31.5–35.7)
MCV: 89 fL (ref 79–97)
Platelets: 313 10*3/uL (ref 150–450)
RBC: 4.9 x10E6/uL (ref 4.14–5.80)
RDW: 13.5 % (ref 11.6–15.4)
WBC: 7.2 10*3/uL (ref 3.4–10.8)

## 2021-03-27 LAB — COMPREHENSIVE METABOLIC PANEL
ALT: 36 IU/L (ref 0–44)
AST: 29 IU/L (ref 0–40)
Albumin/Globulin Ratio: 2.3 — ABNORMAL HIGH (ref 1.2–2.2)
Albumin: 4.6 g/dL (ref 3.8–4.8)
Alkaline Phosphatase: 101 IU/L (ref 44–121)
BUN/Creatinine Ratio: 18 (ref 10–24)
BUN: 16 mg/dL (ref 8–27)
Bilirubin Total: 0.6 mg/dL (ref 0.0–1.2)
CO2: 22 mmol/L (ref 20–29)
Calcium: 9.9 mg/dL (ref 8.6–10.2)
Chloride: 99 mmol/L (ref 96–106)
Creatinine, Ser: 0.89 mg/dL (ref 0.76–1.27)
Globulin, Total: 2 g/dL (ref 1.5–4.5)
Glucose: 103 mg/dL — ABNORMAL HIGH (ref 65–99)
Potassium: 4.7 mmol/L (ref 3.5–5.2)
Sodium: 136 mmol/L (ref 134–144)
Total Protein: 6.6 g/dL (ref 6.0–8.5)
eGFR: 92 mL/min/{1.73_m2} (ref >59)

## 2021-03-27 LAB — LIPID PANEL
Chol/HDL Ratio: 3.1 ratio (ref 0.0–5.0)
Cholesterol, Total: 148 mg/dL (ref 100–199)
HDL: 47 mg/dL (ref >39)
LDL Chol Calc (NIH): 73 mg/dL (ref 0–99)
Triglycerides: 167 mg/dL — ABNORMAL HIGH (ref 0–149)
VLDL Cholesterol Cal: 28 mg/dL (ref 5–40)

## 2021-04-10 IMAGING — US US ABDOMEN LIMITED
1 series · 13 of 25 positions shown · non-contrast
Comparison: 03/11/2016.  Abdomen/pelvis CT 12/05/2008.

CLINICAL DATA: Elevated liver function tests.

EXAM:
ULTRASOUND ABDOMEN LIMITED RIGHT UPPER QUADRANT

[Series 1: us abdomen limited · 0.25mm/px · 13 of 44 slices shown]
[im 1/44]
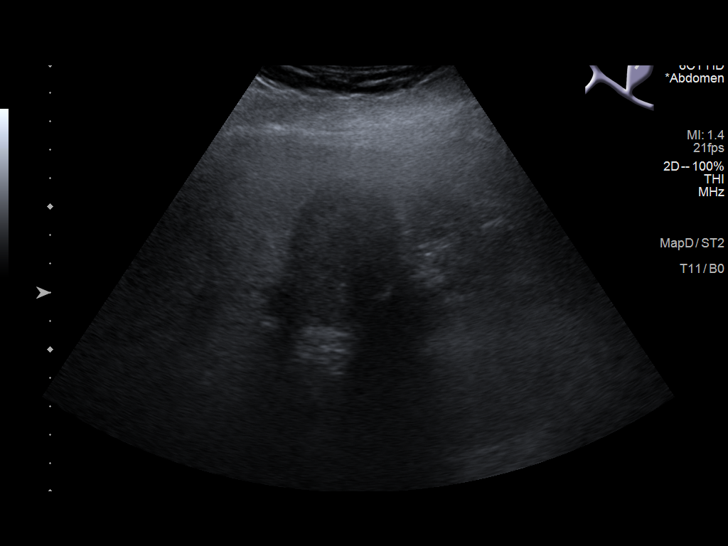
[im 4/44]
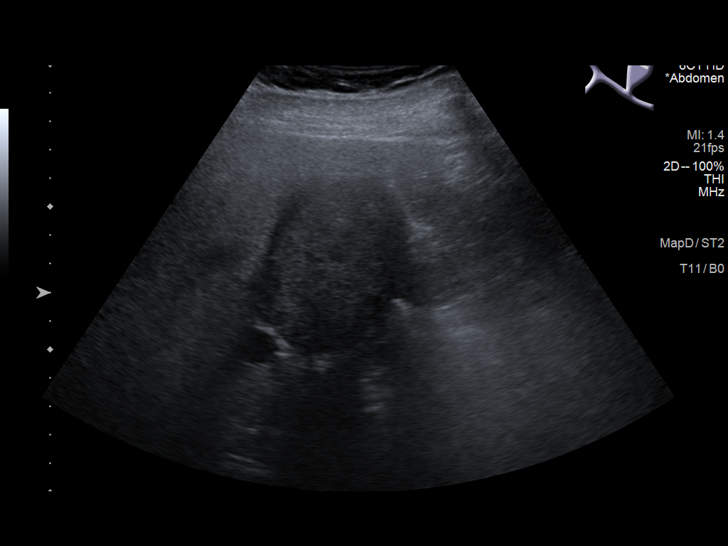
[im 8/44]
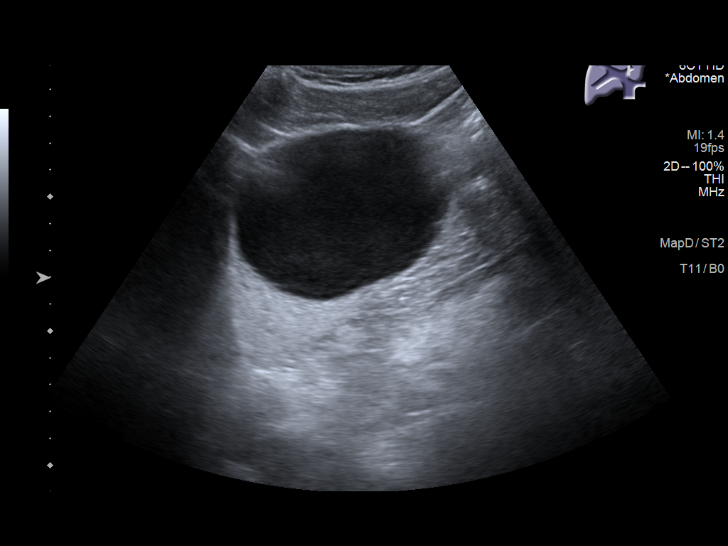
[im 11/44]
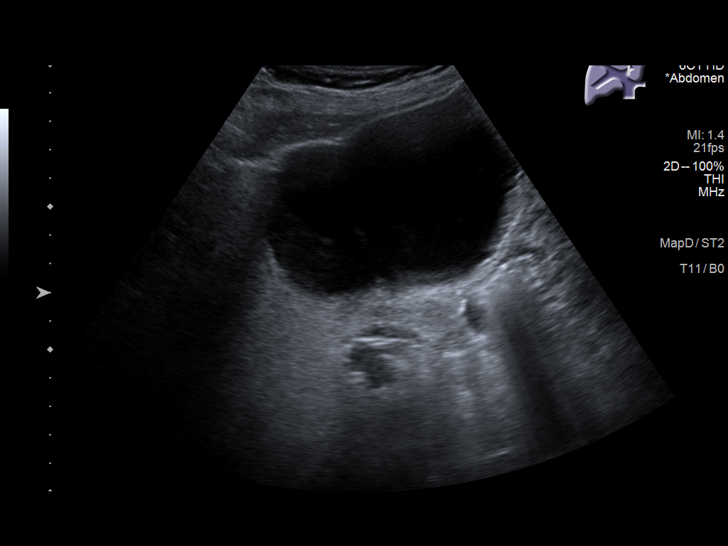
[im 15/44]
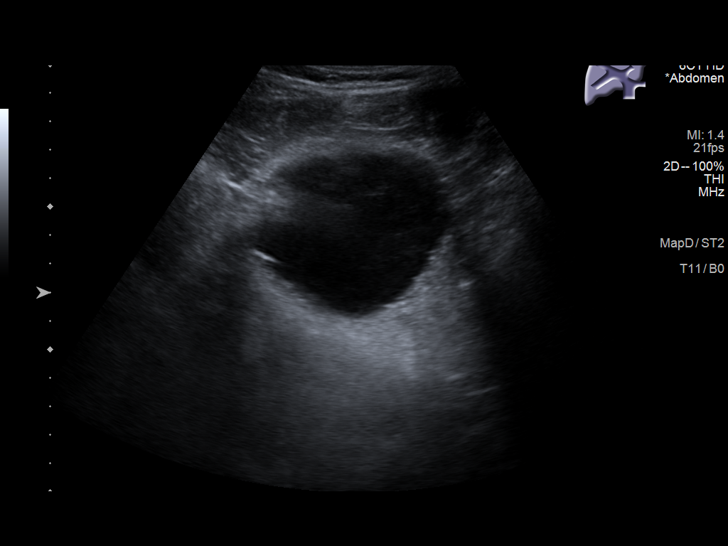
[im 18/44]
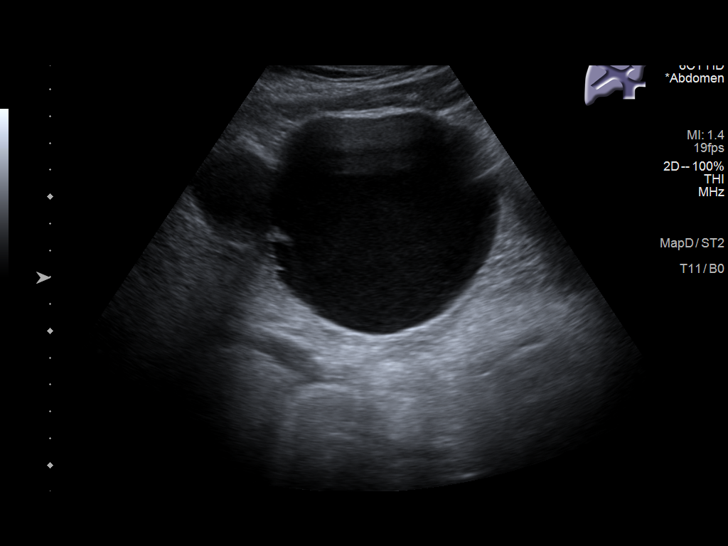
[im 22/44]
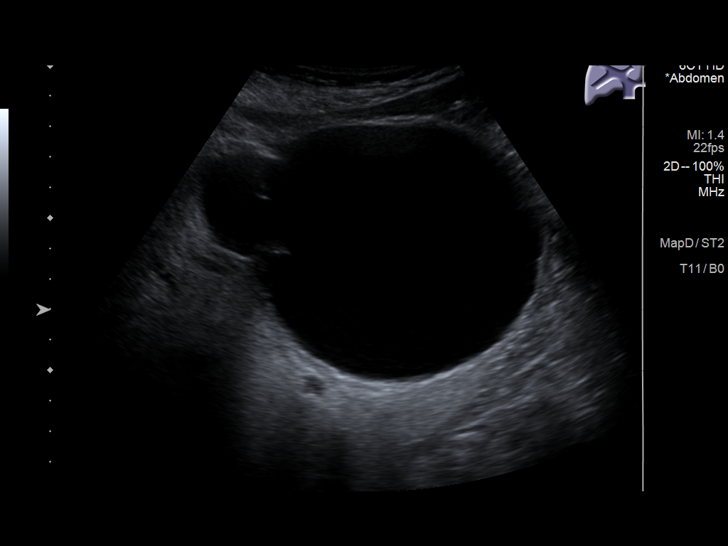
[im 26/44]
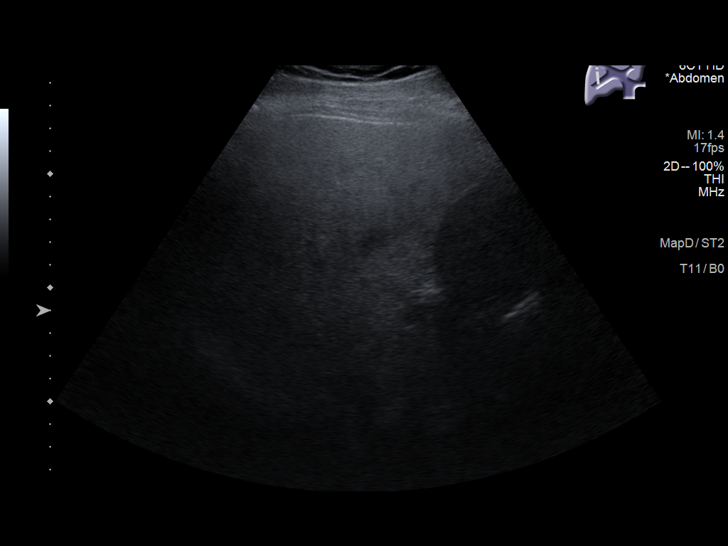
[im 29/44]
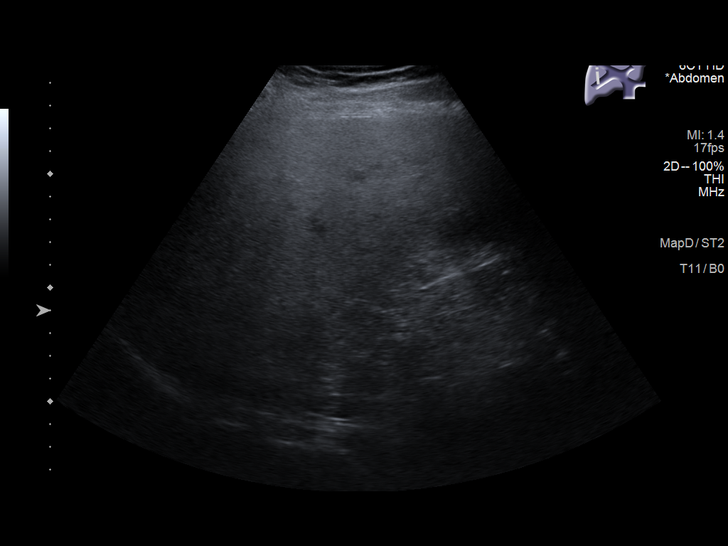
[im 33/44]
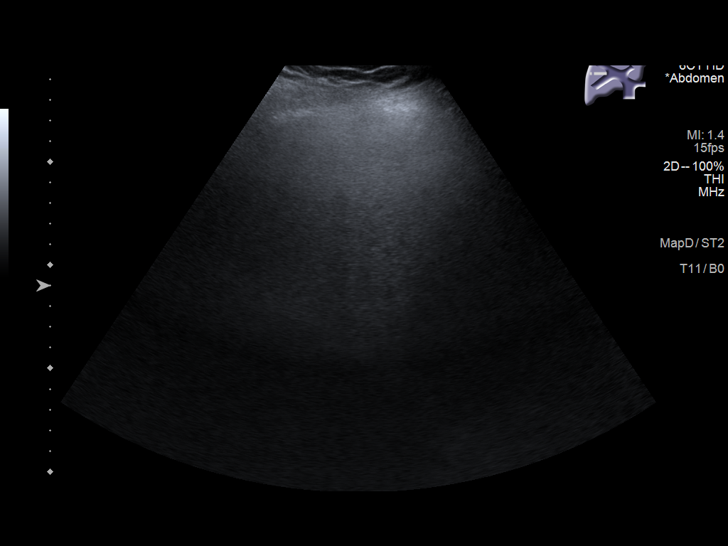
[im 36/44]
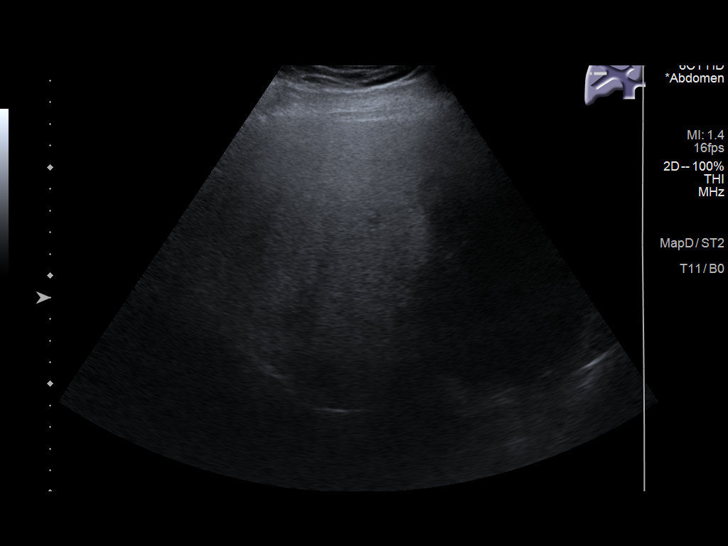
[im 40/44]
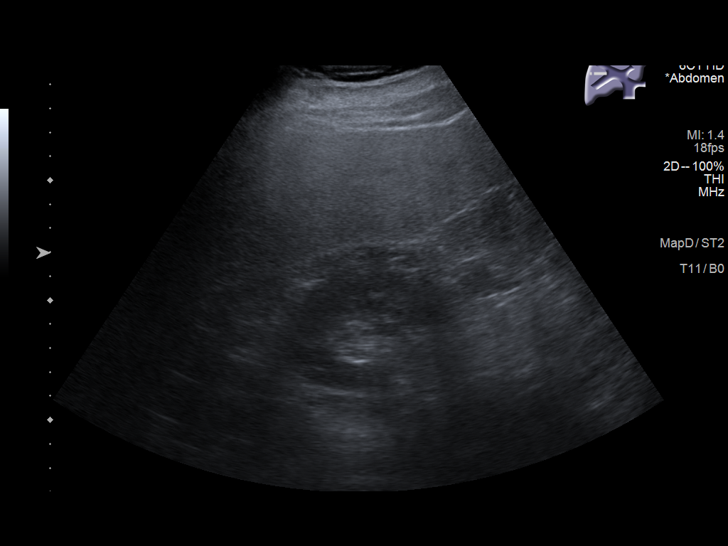
[im 44/44]
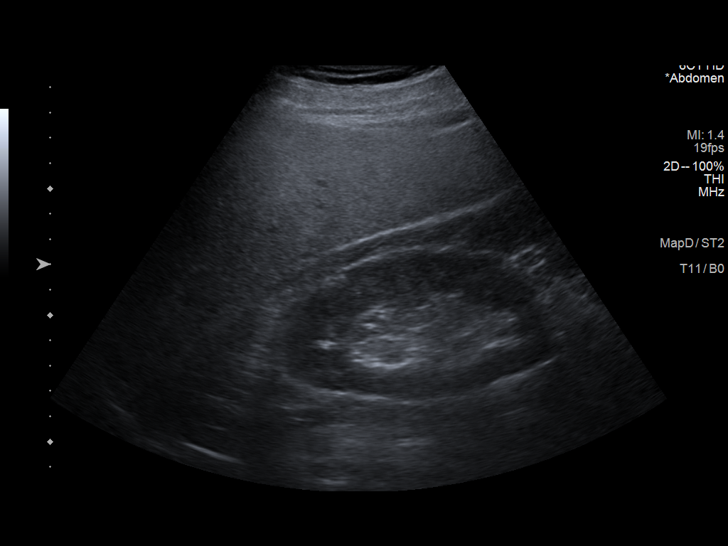

[13 of 25 positions shown; findings below may reference images not displayed]

FINDINGS: Gallbladder:

Surgically absent.

Common bile duct:

Diameter: 4 mm

Liver:

Simple appearing cyst in the lateral segment left liver with
adjacent small simple cyst. Imaging appearance is similar to the CT
scan from 10 years ago. Measuring both components together, this
lesion measures 12.5 x 8.8 x 12.0 cm today compared to 6.5 x 6.2 x
7.1 cm when I remeasure on the prior CT scan.

A second complex lesion is identified in the central liver, adjacent
to the gallbladder fossa. This has some enhanced through
transmission but prominent internal low level echoes, similar to
previous ultrasound exam of 03/11/2016. On today's exam, this lesion
measures 6.1 x 6.8 x 5.6 cm compared to 9.2 x 9.0 x 9.2 cm when I
remeasure on the prior CT scan. No substantial flow signal
appreciated within this lesion on Doppler assessment. Portal vein is
patent on color Doppler imaging with normal direction of blood flow
towards the liver.

No evidence for intrahepatic biliary duct dilatation.

Other: None.
IMPRESSION: 1. Large simple cyst identified lateral segment left liver measuring
up to 12.5 cm maximum dimension today. This has been present since
at least 12/05/2008 when it measured 7.1 cm maximum dimension.
2. Complex lesion in the central liver adjacent to the gallbladder
fossa measuring up to 6.8 cm today. Previous ultrasound exam
characterizes this as a complex cyst. The lesion has decreased in
size comparing back to the CT scan of 12/05/2008. Given that this
lesion has decreased in size over 10 years, imaging features are
most consistent with benign etiology, likely a complex cyst.
3. No intra or extrahepatic biliary duct dilatation.

## 2021-07-04 ENCOUNTER — Other Ambulatory Visit
Admission: RE | Admit: 2021-07-04 | Discharge: 2021-07-04 | Disposition: A | Discharge status: Home / Self Care | Payer: PPO | Source: Ambulatory Visit | Attending: Ophthalmology | Admitting: Ophthalmology

## 2021-07-04 DIAGNOSIS — G4452 New daily persistent headache (NDPH): Secondary | ICD-10-CM | POA: Insufficient documentation

## 2021-07-04 LAB — CBC WITH DIFFERENTIAL/PLATELET
Abs Immature Granulocytes: 0.01 10*3/uL (ref 0.00–0.07)
Basophils Absolute: 0.1 10*3/uL (ref 0.0–0.1)
Basophils Relative: 1 %
Eosinophils Absolute: 0.2 10*3/uL (ref 0.0–0.5)
Eosinophils Relative: 4 %
HCT: 40.8 % (ref 39.0–52.0)
Hemoglobin: 14.3 g/dL (ref 13.0–17.0)
Immature Granulocytes: 0 %
Lymphocytes Relative: 29 %
Lymphs Abs: 1.7 10*3/uL (ref 0.7–4.0)
MCH: 30.8 pg (ref 26.0–34.0)
MCHC: 35 g/dL (ref 30.0–36.0)
MCV: 87.7 fL (ref 80.0–100.0)
Monocytes Absolute: 0.7 10*3/uL (ref 0.1–1.0)
Monocytes Relative: 12 %
Neutro Abs: 3.2 10*3/uL (ref 1.7–7.7)
Neutrophils Relative %: 54 %
Platelets: 279 10*3/uL (ref 150–400)
RBC: 4.65 MIL/uL (ref 4.22–5.81)
RDW: 12.8 % (ref 11.5–15.5)
WBC: 6 10*3/uL (ref 4.0–10.5)
nRBC: 0 % (ref 0.0–0.2)

## 2021-07-04 LAB — SEDIMENTATION RATE: Sed Rate: 5 mm/hr (ref 0–20)

## 2021-07-04 LAB — C-REACTIVE PROTEIN: CRP: 0.5 mg/dL (ref <1.0)

## 2021-08-01 DIAGNOSIS — H401132 Primary open-angle glaucoma, bilateral, moderate stage: Secondary | ICD-10-CM | POA: Diagnosis not present

## 2021-08-03 ENCOUNTER — Other Ambulatory Visit: Payer: Self-pay | Admitting: Family Medicine

## 2021-08-03 DIAGNOSIS — R3911 Hesitancy of micturition: Secondary | ICD-10-CM

## 2021-08-03 DIAGNOSIS — K2100 Gastro-esophageal reflux disease with esophagitis, without bleeding: Secondary | ICD-10-CM

## 2021-08-03 NOTE — Telephone Encounter (Signed)
Requested Prescriptions  Pending Prescriptions Disp Refills  . omeprazole (PRILOSEC) 40 MG capsule [Pharmacy Med Name: OMEPRAZOLE DR 40 MG CAP] 90 capsule 4    Sig: TAKE 1 CAPSULE BY MOUTH ONCE DAILY     Gastroenterology: Proton Pump Inhibitors Passed - 08/03/2021  3:31 PM      Passed - Valid encounter within last 12 months    Recent Outpatient Visits          4 months ago Prediabetes   [facility] Birdie Sons, MD   12 months ago Annual physical exam   [facility] Birdie Sons, MD   1 year ago Hyperlipemia, mixed   [facility] Birdie Sons, MD   2 years ago Annual physical exam   [facility] Birdie Sons, MD   2 years ago Cough   [facility] Birdie Sons, MD      Future Appointments            In 6 months Stoioff, Ronda Fairly, MD [place]           . tamsulosin (FLOMAX) 0.4 MG CAPS capsule [Pharmacy Med Name: TAMSULOSIN HCL 0.4 MG CAP] 90 capsule 4    Sig: TAKE 1 CAPSULE BY MOUTH ONCE DAILY     Urology: Alpha-Adrenergic Blocker Passed - 08/03/2021  3:31 PM      Passed - Last BP in normal range    BP Readings from Last 1 Encounters:  03/26/21 126/72         Passed - Valid encounter within last 12 months    Recent Outpatient Visits          4 months ago Prediabetes   [facility] Birdie Sons, MD   12 months ago Annual physical exam   [facility] Birdie Sons, MD   1 year ago Hyperlipemia, mixed   [facility] Birdie Sons, MD   2 years ago Annual physical exam   [facility] Birdie Sons, MD   2 years ago Cough   [facility] Birdie Sons, MD      Future Appointments            In 6 months [place], Ronda Fairly, [facility]

## 2021-08-07 ENCOUNTER — Encounter: Payer: Self-pay | Admitting: Urology

## 2021-08-28 DIAGNOSIS — R001 Bradycardia, unspecified: Secondary | ICD-10-CM | POA: Diagnosis not present

## 2021-08-28 DIAGNOSIS — E782 Mixed hyperlipidemia: Secondary | ICD-10-CM | POA: Diagnosis not present

## 2021-08-28 DIAGNOSIS — I5022 Chronic systolic (congestive) heart failure: Secondary | ICD-10-CM | POA: Diagnosis not present

## 2021-08-28 DIAGNOSIS — I447 Left bundle-branch block, unspecified: Secondary | ICD-10-CM | POA: Diagnosis not present

## 2021-08-28 DIAGNOSIS — I10 Essential (primary) hypertension: Secondary | ICD-10-CM | POA: Diagnosis not present

## 2021-11-14 DIAGNOSIS — L853 Xerosis cutis: Secondary | ICD-10-CM | POA: Diagnosis not present

## 2021-11-14 DIAGNOSIS — D225 Melanocytic nevi of trunk: Secondary | ICD-10-CM | POA: Diagnosis not present

## 2021-11-14 DIAGNOSIS — D2262 Melanocytic nevi of left upper limb, including shoulder: Secondary | ICD-10-CM | POA: Diagnosis not present

## 2021-11-14 DIAGNOSIS — L814 Other melanin hyperpigmentation: Secondary | ICD-10-CM | POA: Diagnosis not present

## 2021-11-14 DIAGNOSIS — Z85828 Personal history of other malignant neoplasm of skin: Secondary | ICD-10-CM | POA: Diagnosis not present

## 2021-11-14 DIAGNOSIS — D2271 Melanocytic nevi of right lower limb, including hip: Secondary | ICD-10-CM | POA: Diagnosis not present

## 2021-11-19 ENCOUNTER — Other Ambulatory Visit: Payer: Self-pay | Admitting: Family Medicine

## 2021-11-19 DIAGNOSIS — K2100 Gastro-esophageal reflux disease with esophagitis, without bleeding: Secondary | ICD-10-CM

## 2021-12-24 DIAGNOSIS — H401132 Primary open-angle glaucoma, bilateral, moderate stage: Secondary | ICD-10-CM | POA: Diagnosis not present

## 2021-12-31 ENCOUNTER — Other Ambulatory Visit: Payer: Self-pay | Admitting: Family Medicine

## 2021-12-31 DIAGNOSIS — R3911 Hesitancy of micturition: Secondary | ICD-10-CM

## 2022-01-31 ENCOUNTER — Ambulatory Visit (INDEPENDENT_AMBULATORY_CARE_PROVIDER_SITE_OTHER): Payer: PPO

## 2022-01-31 DIAGNOSIS — Z0000 Encounter for general adult medical examination without abnormal findings: Secondary | ICD-10-CM | POA: Diagnosis not present

## 2022-01-31 NOTE — Progress Notes (Signed)
Virtual Visit via Telephone Note  I connected with  Derek Jackson on 01/31/22 at  2:30 PM EDT by telephone and verified that I am speaking with the correct person using two identifiers.  Location: Patient: home Provider: [facility] Persons participating in the virtual visit: [place]   I discussed the limitations, risks, security and privacy concerns of performing an evaluation and management service by telephone and the availability of in person appointments. The patient expressed understanding and agreed to proceed.  Interactive audio and video telecommunications were attempted between this nurse and patient, however failed, due to patient having technical difficulties OR patient did not have access to video capability.  We continued and completed visit with audio only.  Some vital signs may be absent or patient reported.   Derek David, LPN  Subjective:   Derek Jackson is a 71 y.o. male who presents for Medicare Annual/Subsequent preventive examination.  Review of Systems     Cardiac Risk Factors include: advanced age (>89mn, >>37women);male gender;dyslipidemia;hypertension     Objective:    There were no vitals filed for this visit. There is no height or weight on file to calculate BMI.     01/31/2022    2:40 PM 05/17/2020   11:22 AM 03/29/2019    2:06 PM 12/01/2017    8:42 AM 04/16/2017    8:19 AM 12/18/2016    1:18 PM  Advanced Directives  Does Patient Have a Medical Advance Directive? No Yes Yes Yes Yes Yes  Type of ACorporate treasurerof ADaniels FarmLiving will HR[place]Living will HAbingdonLiving will Living will H[place] CityLiving will  Copy of [place]in Chart?  No - copy requested No - copy requested No - copy requested  No - copy requested  Would patient like information on creating a medical advance directive? No - Patient declined         Current  Medications (verified) Outpatient Encounter Medications as of 01/31/2022  Medication Sig   dorzolamide-timolol (COSOPT) 22.3-6.8 MG/ML ophthalmic solution 1 drop 2 (two) times daily.   lisinopril (PRINIVIL,ZESTRIL) 20 MG tablet Take 1 tablet by mouth daily.   metoprolol tartrate (LOPRESSOR) 25 MG tablet Take 1 tablet by mouth 2 (two) times daily.   omeprazole (PRILOSEC) 40 MG capsule TAKE 1 CAPSULE BY MOUTH ONCE DAILY   rosuvastatin (CRESTOR) 5 MG tablet Take 5 mg by mouth daily.   spironolactone (ALDACTONE) 25 MG tablet Take 12.5 mg by mouth daily.    tamsulosin (FLOMAX) 0.4 MG CAPS capsule TAKE 1 CAPSULE BY MOUTH ONCE DAILY   vitamin B-12 (CYANOCOBALAMIN) 100 MCG tablet Take 3,000 mcg by mouth daily.   brimonidine-timolol (COMBIGAN) 0.2-0.5 % ophthalmic solution Place 1 drop into both eyes every 12 (twelve) hours. (Patient not taking: Reported on 01/31/2022)   latanoprost (XALATAN) 0.005 % ophthalmic solution Place 1 drop into both eyes at bedtime. (Patient not taking: Reported on 01/31/2022)   No facility-administered encounter medications on file as of 01/31/2022.    Allergies (verified) Patient has no known allergies.   History: Past Medical History:  Diagnosis Date   BPH (benign prostatic hypertrophy)    COVID-19 04/15/2019   Esophagitis, reflux    Glaucoma    History of shingles 04/27/2015   Kidney stone    Rosacea    Systolic CHF ([place] [id number]  Past Surgical History:  Procedure Laterality Date   Abdominal wall mass excision  2010  Dr. Bary Castilla   CARDIAC CATHETERIZATION  07/2014   No blockages. per patient one side of heart was not functioning well, CHF-Dr. Nehemiah Massed   CHOLECYSTECTOMY  2005   Lapraroscopic   COLONOSCOPY  [id number]   COLONOSCOPY WITH PROPOFOL N/A 04/16/2017   Procedure: COLONOSCOPY WITH PROPOFOL;  Surgeon: Robert Bellow, MD;  Location: [facility] ENDOSCOPY;  Service: Endoscopy;  Laterality: N/A;   CT Scan of Abdomen  12/05/2008   Soft Tissue mass 2.42cm x 3.98 cm  of Abdominal wall lateral to umbilicus, pathology shows fibromatosis. Multiple large liver cysts up to 9cm x 9cm   KNEE SURGERY  1994   Arthroscopy. Surgery by Dr. Marry Guan   RIGHT COLECTOMY  2006   Dr.Byrnett   TONSILLECTOMY AND ADENOIDECTOMY  1960's   UPPER GI ENDOSCOPY  2010   Family History  Problem Relation Age of Onset   Colon polyps Father    Diabetes Father    CAD Father        had CABG   Stroke Father    Dementia Father    Congestive Heart Failure Mother    Heart attack Maternal Grandfather    Diabetes Paternal Grandmother    Heart attack Paternal Grandfather    Heart disease Brother        also had history of VSD   Aneurysm Brother    Prostate cancer Neg Hx    Bladder Cancer Neg Hx    Kidney cancer Neg Hx    Social History   Socioeconomic History   Marital status: Significant Other    Spouse name: Not on file   Number of children: 3   Years of education: Not on file   Highest education level: Some college, no degree  Occupational History   Occupation: Employed    Comment: Works at [employer]: part time  Tobacco Use   Smoking status: Former    Packs/day: 1.00    Years: 8.00    Total pack years: 8.00    Types: Cigarettes    Quit date: 04/26/1990    Years since quitting: 31.7   Smokeless tobacco: Never  Vaping Use   Vaping Use: Never used  Substance and Sexual Activity   Alcohol use: Yes    Alcohol/week: 0.0 standard drinks of alcohol    Comment: 6 pack a month/socially   Drug use: No   Sexual activity: Yes    Birth control/protection: None  Other Topics Concern   Not on file  Social History Narrative   Retired April 2017, still working 3 days a week   Social Determinants of Radio broadcast assistant Strain: Low Risk  (01/31/2022)   Overall Financial Resource Strain (CARDIA)    Difficulty of Paying Living Expenses: Not hard at all  Food Insecurity: No Food Insecurity (01/31/2022)   Hunger Vital Sign    Worried About  Running Out of Food in the Last Year: Never true    [place] in the Last Year: Never true  Transportation Needs: No Transportation Needs (01/31/2022)   PRAPARE - Hydrologist (Medical): No    Lack of Transportation (Non-Medical): No  Physical Activity: Sufficiently Active (01/31/2022)   Exercise Vital Sign    Days of Exercise per Week: 6 days    Minutes of Exercise per Session: 40 min  Stress: No Stress Concern Present (01/31/2022)   [place]    Feeling of  Stress : Not at all  Social Connections: Moderately Isolated (01/31/2022)   Social Connection and Isolation Panel [NHANES]    Frequency of Communication with Friends and Family: Three times a week    Frequency of Social Gatherings with Friends and Family: Once a week    Attends Religious Services: Never    Printmaker: Not on file    Attends Archivist Meetings: Never    Marital Status: Living with partner    Tobacco Counseling Counseling given: Not Answered   Clinical Intake:  Pre-visit preparation completed: Yes  Pain : No/denies pain     Nutritional Risks: None Diabetes: No  How often do you need to have someone help you when you read instructions, pamphlets, or other written materials from your doctor or pharmacy?: 1 - Never  Diabetic?no  Interpreter Needed?: No  Information entered by :: Kirke Shaggy, LPN   Activities of Daily Living    01/31/2022    2:41 PM 03/26/2021   10:30 AM  In your present state of health, do you have any difficulty performing the following activities:  Hearing? 0 0  Vision? 0 0  Difficulty concentrating or making decisions? 0 1  Walking or climbing stairs? 0 1  Dressing or bathing? 0 0  Doing errands, shopping? 0 0  Preparing Food and eating ? N   Using the Toilet? N   In the past six months, have you accidently leaked urine? N   Do you have  problems with loss of bowel control? N   Managing your Medications? N   Managing your Finances? N   Housekeeping or managing your Housekeeping? N     Patient Care Team: Birdie Sons, MD as PCP - General (Family Medicine) Bary Castilla, [name], MD (General Surgery) Abbie Sons, MD as Consulting Physician (Urology) Corey Skains, MD as Consulting Physician (Cardiology) [facility], [facility]) Marchia Meiers, MD (Inactive) as Consulting Physician (Ophthalmology) Oneta Rack, MD (Dermatology)  Indicate any recent Medical Services you may have received from other than [facility] providers in the past year (date may be approximate).     Assessment:   This is a routine wellness examination for [place].  Hearing/Vision screen Hearing Screening - Comments:: No aids Vision Screening - Comments:: Wears glasses- [facility]  Dietary issues and exercise activities discussed: Current Exercise Habits: Home exercise routine, Type of exercise: walking, Time (Minutes): 45, Frequency (Times/Week): 6, Weekly Exercise (Minutes/Week): 270, Intensity: Moderate   Goals Addressed             This Visit's Progress    DIET - EAT MORE FRUITS AND VEGETABLES         Depression Screen    01/31/2022    2:38 PM 03/26/2021   10:30 AM 05/17/2020   11:20 AM 03/29/2019    2:07 PM 12/01/2017    8:42 AM 12/18/2016    1:18 PM 12/18/2015    9:06 AM  PHQ 2/9 Scores  PHQ - 2 Score 0 3 0 0 '1 5 2  '$ PHQ- 9 Score 0 '7    8 4    '$ Fall Risk    01/31/2022    2:41 PM 03/26/2021   10:30 AM 05/17/2020   11:23 AM 01/17/2020    8:10 AM 03/29/2019    2:07 PM  Fall Risk   Falls in the past year? 0 0 0 0 0  Number falls in past yr: 0 0 0 0  Injury with Fall? 0 0 0 0   Risk for fall due to : No Fall Risks No Fall Risks     Follow up Falls evaluation completed Falls evaluation completed       [place]:  Any stairs in or around the home? Yes  If so, are there any  without handrails? No  Home free of loose throw rugs in walkways, pet beds, electrical cords, etc? Yes  Adequate lighting in your home to reduce risk of falls? Yes   ASSISTIVE DEVICES UTILIZED TO PREVENT FALLS:  Life alert? No  Use of a cane, walker or w/c? No  Grab bars in the bathroom? No  Shower chair or bench in shower? Yes  Elevated toilet seat or a handicapped toilet? No     Cognitive Function:        01/31/2022    2:43 PM 12/18/2016    1:26 PM  6CIT Screen  What Year? 0 points 0 points  What month? 0 points 0 points  What time? 0 points 0 points  Count back from 20 0 points 0 points  Months in reverse 0 points 0 points  Repeat phrase 0 points 4 points  Total Score 0 points 4 points    Immunizations Immunization History  Administered Date(s) Administered   H1N1 07/13/2008   Influenza, High Dose Seasonal PF 06/11/2019, 05/15/2020   Influenza-Unspecified 06/04/2018   PFIZER Comirnaty(Gray Top)Covid-19 Tri-Sucrose Vaccine 10/11/2019, 11/01/2019   PFIZER(Purple Top)SARS-COV-2 Vaccination 10/11/2019, 11/01/2019, 06/12/2020   Pneumococcal Conjugate-13 12/18/2015   Pneumococcal Polysaccharide-23 12/23/2016   Td 03/21/1999, 11/25/2003   Zoster, Live 10/22/2010    TDAP status: Due, Education has been provided regarding the importance of this vaccine. Advised may receive this vaccine at local pharmacy or Health Dept. Aware to provide a copy of the vaccination record if obtained from local pharmacy or Health Dept. Verbalized acceptance and understanding.  Flu Vaccine status: Declined, Education has been provided regarding the importance of this vaccine but patient still declined. Advised may receive this vaccine at local pharmacy or Health Dept. Aware to provide a copy of the vaccination record if obtained from local pharmacy or Health Dept. Verbalized acceptance and understanding.  Pneumococcal vaccine status: Up to date  Covid-19 vaccine status: Completed  vaccines  Qualifies for Shingles Vaccine? Yes   Zostavax coShingrix Completed?: No.    Education has been provided regarding the importance of this vaccine. Patient has been advised to call insurance company to determine out of pocket expense if they have not yet received this vaccine. Advised may also receive vaccine at local pharmacy or Health Dept. Verbalized acceptance and understanding.mpleted Yes     Screening Tests Health Maintenance  Topic Date Due   Zoster Vaccines- Shingrix (1 of 2) Never done   TETANUS/TDAP  11/24/2013   COVID-19 Vaccine (6 - Pfizer series) 08/07/2020   INFLUENZA VACCINE  03/26/2022   COLONOSCOPY (Pts 45-29yr Insurance coverage will need to be confirmed)  04/16/2022   Pneumonia Vaccine 72 Years old  Completed   Hepatitis C Screening  Completed   HPV VACCINES  Aged Out    Health Maintenance  Health Maintenance Due  Topic Date Due   Zoster Vaccines- Shingrix (1 of 2) Never done   TETANUS/TDAP  11/24/2013   COVID-19 Vaccine (6 - Pfizer series) 08/07/2020    Colorectal cancer screening: Type of screening: Colonoscopy. Completed 04/16/17. [facility]  Lung Cancer Screening: (Low Dose  CT Chest recommended if Age 63-80 years, 30 pack-year currently smoking OR have quit w/in 15years.)  qualify.  does not   Additional Screening:  Hepatitis C Screening: does qualify; Completed 01/17/20  Vision Screening: Recommended annual ophthalmology exams for early detection of glaucoma and other disorders of the eye. Is the patient up to date with their annual eye exam?  Yes  Who is the provider or what is the name of the office in which the patient attends annual eye exams? [facility] If pt is not established with a provider, would they like to be referred to a provider to establish care? No .   Dental Screening: Recommended annual dental exams for proper oral hygiene  Community Resource Referral / Chronic Care  Management: CRR required this visit?  No   CCM required this visit?  No      Plan:     I have personally reviewed and noted the following in the patient's chart:   Medical and social history Use of alcohol, tobacco or illicit drugs  Current medications and supplements including opioid prescriptions. Patient is not currently taking opioid prescriptions. Functional ability and status Nutritional status Physical activity Advanced directives List of other physicians Hospitalizations, surgeries, and ER visits in previous 12 months Vitals Screenings to include cognitive, depression, and falls Referrals and appointments  In addition, I have reviewed and discussed with patient certain preventive protocols, quality metrics, and best practice recommendations. A written personalized care plan for preventive services as well as general preventive health recommendations were provided to patient.     Derek David, LPN   04/03/2118   Nurse Notes: none

## 2022-01-31 NOTE — Patient Instructions (Signed)
[name] , Thank you for taking time to come for your Medicare Wellness Visit. I appreciate your ongoing commitment to your health goals. Please review the following plan we discussed and let me know if I can assist you in the future.   Screening recommendations/referrals: Colonoscopy: 04/16/17, has to go to [facility] to have it this year Recommended yearly ophthalmology/optometry visit for glaucoma screening and checkup Recommended yearly dental visit for hygiene and checkup  Vaccinations: Influenza vaccine: n/d Pneumococcal vaccine: 12/23/16 Tdap vaccine: 11/25/03, due Shingles vaccine:Shingrix - has had one, doesn't know date   Zostavax - 10/22/10 Covid-19: 10/11/19, 11/01/19, 06/12/20  Advanced directives: no  Conditions/risks identified: none  Next appointment: Follow up in one year for your annual wellness visit. 02/03/23@ 2pm by phone  Preventive Care 65 Years and Older, Male Preventive care refers to lifestyle choices and visits with your health care provider that can promote health and wellness. What does preventive care include? A yearly physical exam. This is also called an annual well check. Dental exams once or twice a year. Routine eye exams. Ask your health care provider how often you should have your eyes checked. Personal lifestyle choices, including: Daily care of your teeth and gums. Regular physical activity. Eating a healthy diet. Avoiding tobacco and drug use. Limiting alcohol use. Practicing safe sex. Taking low doses of aspirin every day. Taking vitamin and mineral supplements as recommended by your health care provider. What happens during an annual well check? The services and screenings done by your health care provider during your annual well check will depend on your age, overall health, lifestyle risk factors, and family history of disease. Counseling  Your health care provider may ask you questions about your: Alcohol use. Tobacco use. Drug use. Emotional  well-being. Home and relationship well-being. Sexual activity. Eating habits. History of falls. Memory and ability to understand (cognition). Work and work Statistician. Screening  You may have the following tests or measurements: Height, weight, and BMI. Blood pressure. Lipid and cholesterol levels. These may be checked every 5 years, or more frequently if you are over 84 years old. Skin check. Lung cancer screening. You may have this screening every year starting at age 34 if you have a 30-pack-year history of smoking and currently smoke or have quit within the past 15 years. Fecal occult blood test (FOBT) of the stool. You may have this test every year starting at age 26. Flexible sigmoidoscopy or colonoscopy. You may have a sigmoidoscopy every 5 years or a colonoscopy every 10 years starting at age 63. Prostate cancer screening. Recommendations will vary depending on your family history and other risks. Hepatitis C blood test. Hepatitis B blood test. Sexually transmitted disease (STD) testing. Diabetes screening. This is done by checking your blood sugar (glucose) after you have not eaten for a while (fasting). You may have this done every 1-3 years. Abdominal aortic aneurysm (AAA) screening. You may need this if you are a current or former smoker. Osteoporosis. You may be screened starting at age 30 if you are at high risk. Talk with your health care provider about your test results, treatment options, and if necessary, the need for more tests. Vaccines  Your health care provider may recommend certain vaccines, such as: Influenza vaccine. This is recommended every year. Tetanus, diphtheria, and acellular pertussis (Tdap, Td) vaccine. You may need a Td booster every 10 years. Zoster vaccine. You may need this after age 81. Pneumococcal 13-valent conjugate (PCV13) vaccine. One dose is recommended after  age 35. Pneumococcal polysaccharide (PPSV23) vaccine. One dose is recommended after  age 58. Talk to your health care provider about which screenings and vaccines you need and how often you need them. This information is not intended to replace advice given to you by your health care provider. Make sure you discuss any questions you have with your health care provider. Document Released: 09/08/2015 Document Revised: 05/01/2016 Document Reviewed: 06/13/2015 Elsevier Interactive Patient Education  2017 [employer] Prevention in the Home Falls can cause injuries. They can happen to people of all ages. There are many things you can do to make your home safe and to help prevent falls. What can I do on the outside of my home? Regularly fix the edges of walkways and driveways and fix any cracks. Remove anything that might make you trip as you walk through a door, such as a raised step or threshold. Trim any bushes or trees on the path to your home. Use bright outdoor lighting. Clear any walking paths of anything that might make someone trip, such as rocks or tools. Regularly check to see if handrails are loose or broken. Make sure that both sides of any steps have handrails. Any raised decks and porches should have guardrails on the edges. Have any leaves, snow, or ice cleared regularly. Use sand or salt on walking paths during winter. Clean up any spills in your garage right away. This includes oil or grease spills. What can I do in the bathroom? Use night lights. Install grab bars by the toilet and in the tub and shower. Do not use towel bars as grab bars. Use non-skid mats or decals in the tub or shower. If you need to sit down in the shower, use a plastic, non-slip stool. Keep the floor dry. Clean up any water that spills on the floor as soon as it happens. Remove soap buildup in the tub or shower regularly. Attach bath mats securely with double-sided non-slip rug tape. Do not have throw rugs and other things on the floor that can make you trip. What can I do in the  bedroom? Use night lights. Make sure that you have a light by your bed that is easy to reach. Do not use any sheets or blankets that are too big for your bed. They should not hang down onto the floor. Have a firm chair that has side arms. You can use this for support while you get dressed. Do not have throw rugs and other things on the floor that can make you trip. What can I do in the kitchen? Clean up any spills right away. Avoid walking on wet floors. Keep items that you use a lot in easy-to-reach places. If you need to reach something above you, use a strong step stool that has a grab bar. Keep electrical cords out of the way. Do not use floor polish or wax that makes floors slippery. If you must use wax, use non-skid floor wax. Do not have throw rugs and other things on the floor that can make you trip. What can I do with my stairs? Do not leave any items on the stairs. Make sure that there are handrails on both sides of the stairs and use them. Fix handrails that are broken or loose. Make sure that handrails are as long as the stairways. Check any carpeting to make sure that it is firmly attached to the stairs. Fix any carpet that is loose or worn. Avoid having throw rugs  at the top or bottom of the stairs. If you do have throw rugs, attach them to the floor with carpet tape. Make sure that you have a light switch at the top of the stairs and the bottom of the stairs. If you do not have them, ask someone to add them for you. What else can I do to help prevent falls? Wear shoes that: Do not have high heels. Have rubber bottoms. Are comfortable and fit you well. Are closed at the toe. Do not wear sandals. If you use a stepladder: Make sure that it is fully opened. Do not climb a closed stepladder. Make sure that both sides of the stepladder are locked into place. Ask someone to hold it for you, if possible. Clearly mark and make sure that you can see: Any grab bars or  handrails. First and last steps. Where the edge of each step is. Use tools that help you move around (mobility aids) if they are needed. These include: Canes. Walkers. Scooters. Crutches. Turn on the lights when you go into a dark area. Replace any light bulbs as soon as they burn out. Set up your furniture so you have a clear path. Avoid moving your furniture around. If any of your floors are uneven, fix them. If there are any pets around you, be aware of where they are. Review your medicines with your doctor. Some medicines can make you feel dizzy. This can increase your chance of falling. Ask your doctor what other things that you can do to help prevent falls. This information is not intended to replace advice given to you by your health care provider. Make sure you discuss any questions you have with your health care provider. Document Released: 06/08/2009 Document Revised: 01/18/2016 Document Reviewed: 09/16/2014 Elsevier Interactive Patient Education  2017 [employer].

## 2022-02-01 ENCOUNTER — Other Ambulatory Visit: Payer: Self-pay | Admitting: Family Medicine

## 2022-02-01 DIAGNOSIS — K2100 Gastro-esophageal reflux disease with esophagitis, without bleeding: Secondary | ICD-10-CM

## 2022-02-01 NOTE — Telephone Encounter (Signed)
Requested Prescriptions  Pending Prescriptions Disp Refills  . omeprazole (PRILOSEC) 40 MG capsule [Pharmacy Med Name: OMEPRAZOLE DR 40 MG CAP] 90 capsule 0    Sig: Take 1 capsule (40 mg total) by mouth daily.     Gastroenterology: Proton Pump Inhibitors Passed - 02/01/2022 12:07 PM      Passed - Valid encounter within last 12 months    Recent Outpatient Visits          10 months ago Prediabetes   [facility] Birdie Sons, MD   1 year ago Annual physical exam   [facility] Birdie Sons, MD   2 years ago Hyperlipemia, mixed   [facility] Birdie Sons, MD   2 years ago Annual physical exam   [facility] Birdie Sons, MD   3 years ago Cough   [facility] Birdie Sons, MD      Future Appointments            In 1 month Stoioff, Ronda Fairly, MD [place]   In 2 months Fisher, Kirstie Peri, MD [facility] [place]

## 2022-02-07 DIAGNOSIS — R0602 Shortness of breath: Secondary | ICD-10-CM | POA: Diagnosis not present

## 2022-02-07 DIAGNOSIS — I10 Essential (primary) hypertension: Secondary | ICD-10-CM | POA: Diagnosis not present

## 2022-02-07 DIAGNOSIS — R001 Bradycardia, unspecified: Secondary | ICD-10-CM | POA: Diagnosis not present

## 2022-02-07 DIAGNOSIS — E782 Mixed hyperlipidemia: Secondary | ICD-10-CM | POA: Diagnosis not present

## 2022-02-07 DIAGNOSIS — I5022 Chronic systolic (congestive) heart failure: Secondary | ICD-10-CM | POA: Diagnosis not present

## 2022-02-07 DIAGNOSIS — R42 Dizziness and giddiness: Secondary | ICD-10-CM | POA: Diagnosis not present

## 2022-02-15 ENCOUNTER — Encounter: Payer: PPO | Admitting: Family Medicine

## 2022-02-22 ENCOUNTER — Other Ambulatory Visit: Payer: Self-pay

## 2022-02-25 ENCOUNTER — Ambulatory Visit: Payer: Self-pay | Admitting: Urology

## 2022-02-27 ENCOUNTER — Ambulatory Visit: Payer: PPO | Admitting: Urology

## 2022-02-28 ENCOUNTER — Other Ambulatory Visit: Payer: Self-pay | Admitting: Family Medicine

## 2022-02-28 DIAGNOSIS — Z87898 Personal history of other specified conditions: Secondary | ICD-10-CM

## 2022-03-04 ENCOUNTER — Ambulatory Visit: Payer: PPO | Admitting: Urology

## 2022-03-11 ENCOUNTER — Other Ambulatory Visit: Payer: PPO

## 2022-03-11 DIAGNOSIS — Z87898 Personal history of other specified conditions: Secondary | ICD-10-CM

## 2022-03-12 ENCOUNTER — Telehealth: Payer: Self-pay

## 2022-03-12 LAB — PSA: Prostate Specific Ag, Serum: 3.3 ng/mL (ref 0.0–4.0)

## 2022-03-12 NOTE — Telephone Encounter (Signed)
Patient is due for their 5 year follow up colonoscopy. Last one done 04/16/2017 by Dr Bary Castilla. Left message to see if they would like a referral to [facility] to have this done, or if they would like to return to Dr Bary Castilla who is at [facility].

## 2022-03-18 ENCOUNTER — Encounter: Payer: Self-pay | Admitting: Urology

## 2022-03-18 ENCOUNTER — Ambulatory Visit: Payer: PPO | Admitting: Urology

## 2022-03-18 VITALS — BP 134/82 | HR 65 | Ht 73.0 in | Wt 214.0 lb

## 2022-03-18 DIAGNOSIS — N401 Enlarged prostate with lower urinary tract symptoms: Secondary | ICD-10-CM

## 2022-03-18 DIAGNOSIS — R3911 Hesitancy of micturition: Secondary | ICD-10-CM

## 2022-03-18 DIAGNOSIS — R0602 Shortness of breath: Secondary | ICD-10-CM | POA: Diagnosis not present

## 2022-03-18 DIAGNOSIS — I5022 Chronic systolic (congestive) heart failure: Secondary | ICD-10-CM | POA: Diagnosis not present

## 2022-03-18 MED ORDER — TAMSULOSIN HCL 0.4 MG PO CAPS: 0.4000 mg | ORAL_CAPSULE | Freq: Every day | ORAL | 2 refills | Status: DC

## 2022-03-18 NOTE — Progress Notes (Signed)
03/18/2022 8:06 AM   Derek Jackson Dec 06, 1950 [id number]  Referring provider: Birdie Sons, [place] [address] [place],  [place] [address]  Chief Complaint  Patient presents with   Elevated PSA    Urologic history: 1.  Elevated PSA -Prostate biopsy 03/2018; PSA 4.7; volume 53 cc; benign pathology   2.  BPH with lower urinary tract symptoms -On tamsulosin  HPI: 71 y.o. male presents for annual follow-up.  No significant changes since last years visit Stable LUTS; nocturia x2; some increase in frequency which she attributes to increased fluid intake PSA drawn 617/23 stable at 3.3   PMH: Past Medical History:  Diagnosis Date   BPH (benign prostatic hypertrophy)    COVID-19 04/15/2019   Esophagitis, reflux    Glaucoma    History of shingles 04/27/2015   Kidney stone    Rosacea    Systolic CHF ([place]) 4481    Surgical History: Past Surgical History:  Procedure Laterality Date   Abdominal wall mass excision  2010   Dr. Bary Castilla   CARDIAC CATHETERIZATION  07/2014   No blockages. per patient one side of heart was not functioning well, CHF-Dr. Nehemiah Massed   CHOLECYSTECTOMY  2005   Lapraroscopic   COLONOSCOPY  8563,[id number]   COLONOSCOPY WITH PROPOFOL N/A 04/16/2017   Procedure: COLONOSCOPY WITH PROPOFOL;  Surgeon: Robert Bellow, MD;  Location: [facility] ENDOSCOPY;  Service: Endoscopy;  Laterality: N/A;   CT Scan of Abdomen  12/05/2008   Soft Tissue mass 2.42cm x 3.98 cm of Abdominal wall lateral to umbilicus, pathology shows fibromatosis. Multiple large liver cysts up to 9cm x 9cm   KNEE SURGERY  1994   Arthroscopy. Surgery by Dr. Marry Guan   RIGHT COLECTOMY  2006   Dr.Byrnett   TONSILLECTOMY AND ADENOIDECTOMY  1960's   UPPER GI ENDOSCOPY  2010    Home Medications:  Allergies as of 03/18/2022   No Known Allergies      Medication List        Accurate as of March 18, 2022  8:06 AM. If you have any questions, ask your nurse or doctor.          STOP  taking these medications    brimonidine-timolol 0.2-0.5 % ophthalmic solution Commonly known as: COMBIGAN Stopped by: Abbie Sons, MD   latanoprost 0.005 % ophthalmic solution Commonly known as: XALATAN Stopped by: Abbie Sons, MD       TAKE these medications    dorzolamide-timolol 22.3-6.8 MG/ML ophthalmic solution Commonly known as: COSOPT 1 drop 2 (two) times daily.   lisinopril 20 MG tablet Commonly known as: ZESTRIL Take 1 tablet by mouth daily.   metoprolol tartrate 25 MG tablet Commonly known as: LOPRESSOR Take 1 tablet by mouth 2 (two) times daily.   omeprazole 40 MG capsule Commonly known as: PRILOSEC Take 1 capsule (40 mg total) by mouth daily.   rosuvastatin 5 MG tablet Commonly known as: CRESTOR Take 5 mg by mouth daily.   spironolactone 25 MG tablet Commonly known as: ALDACTONE Take 12.5 mg by mouth daily.   tamsulosin 0.4 MG Caps capsule Commonly known as: FLOMAX TAKE 1 CAPSULE BY MOUTH ONCE DAILY   vitamin B-12 100 MCG tablet Commonly known as: CYANOCOBALAMIN Take 3,000 mcg by mouth daily.        Allergies: No Known Allergies  Family History: Family History  Problem Relation Age of Onset   Colon polyps Father    Diabetes Father    CAD Father  had CABG   Stroke Father    Dementia Father    Congestive Heart Failure Mother    Heart attack Maternal Grandfather    Diabetes Paternal Grandmother    Heart attack Paternal Grandfather    Heart disease Brother        also had history of VSD   Aneurysm Brother    Prostate cancer Neg Hx    Bladder Cancer Neg Hx    Kidney cancer Neg Hx     Social History:  reports that he quit smoking about 31 years ago. His smoking use included cigarettes. He has a 8.00 pack-year smoking history. He has never used smokeless tobacco. He reports current alcohol use. He reports that he does not use drugs.   Physical Exam: BP 134/82   Pulse 65   Ht '6\' 1"'$  (1.854 m)   Wt 214 lb (97.1 kg)    BMI 28.23 kg/m   Constitutional:  Alert and oriented, No acute distress. HEENT: [place] AT Respiratory: Normal respiratory effort, no increased work of breathing. Neurologic: Grossly intact, no focal deficits, moving all 4 extremities. Psychiatric: Normal mood and affect.   Assessment & Plan:    1.  BPH with LUTS Stable on tamsulosin Tamsulosin refilled  2.  History elevated PSA Stable PSA    Abbie Sons, MD  [facility] [address], [place], [place] [address] [phone]

## 2022-03-26 DIAGNOSIS — I10 Essential (primary) hypertension: Secondary | ICD-10-CM | POA: Diagnosis not present

## 2022-03-26 DIAGNOSIS — I5022 Chronic systolic (congestive) heart failure: Secondary | ICD-10-CM | POA: Diagnosis not present

## 2022-03-26 DIAGNOSIS — I447 Left bundle-branch block, unspecified: Secondary | ICD-10-CM | POA: Diagnosis not present

## 2022-03-26 DIAGNOSIS — E782 Mixed hyperlipidemia: Secondary | ICD-10-CM | POA: Diagnosis not present

## 2022-03-26 DIAGNOSIS — R001 Bradycardia, unspecified: Secondary | ICD-10-CM | POA: Diagnosis not present

## 2022-04-02 ENCOUNTER — Other Ambulatory Visit: Payer: Self-pay | Admitting: General Surgery

## 2022-04-02 DIAGNOSIS — Z85038 Personal history of other malignant neoplasm of large intestine: Secondary | ICD-10-CM | POA: Diagnosis not present

## 2022-04-02 DIAGNOSIS — Z8249 Family history of ischemic heart disease and other diseases of the circulatory system: Secondary | ICD-10-CM

## 2022-04-02 DIAGNOSIS — K219 Gastro-esophageal reflux disease without esophagitis: Secondary | ICD-10-CM | POA: Diagnosis not present

## 2022-04-03 ENCOUNTER — Other Ambulatory Visit: Payer: Self-pay | Admitting: General Surgery

## 2022-04-03 NOTE — Progress Notes (Signed)
Progress Notes - documented in this encounter , Geronimo Boot, MD - 04/02/2022 10:45 AM EDT Formatting of this note is different from the original. Subjective:   Patient ID: Derek Jackson is a 71 y.o. male.  HPI  The following portions of the patient's history were reviewed and updated as appropriate.  This an established patient is here today for: office visit. The patient is here today to discuss having a colonoscopy. His last colonoscopy was completed on 04-16-17. Patient did have a right colectomy done in 2006.  Patient reports bowel movements once per day. He denies any bleeding or mucus.   He has a notable history that an older brother underwent repair of an abdominal aortic aneurysm. Last ultrasound evaluation was in 2010.  Chief Complaint  Patient presents with  Pre-op Exam    BP 124/82  Pulse 66  Temp 36.6 C (97.8 F)  Ht 185.4 cm ('6\' 1"'$ )  Wt 99.3 kg (219 lb)  SpO2 96%  BMI 28.89 kg/m   Past Medical History:  Diagnosis Date  Cardiomyopathy, secondary (CMS-HCC)  CHF (congestive heart failure) (CMS-HCC)  Colon cancer (CMS-HCC) 2006  COVID-19 03/2019  Depression  GERD (gastroesophageal reflux disease)  Hyperlipidemia  Hypertension  Kidney stone  LBBB (left bundle branch block)  Rosacea    Past Surgical History:  Procedure Laterality Date  knee surgery [place] 2006  right colectomy  upper endoscopy 2010  Dr Bary Castilla  COLONOSCOPY 2013  Dr Bary Castilla  COLONOSCOPY 04/16/2017  CARDIAC CATHETERIZATION  COLONOSCOPY  2013  TONSILLECTOMY AND ADENOIDECTOMY  1960's    Social History   Socioeconomic History  Marital status: Single  Tobacco Use  Smoking status: Former  Packs/day: 1.00  Years: 8.00  Pack years: 8.00  Types: Cigarettes  Quit date: 04/26/1990  Years since quitting: 31.9  Smokeless tobacco: Never  Vaping Use  Vaping Use: Never used  Substance and Sexual Activity  Alcohol use: Yes  Alcohol/week: 0.0 standard drinks   Drug use: No  Sexual activity: Yes  Partners: Female    No Known Allergies  Current Outpatient Medications  Medication Sig Dispense Refill  cyanocobalamin, vitamin B-12, 3,000 mcg Cap Take 3,000 mcg by mouth once daily  dorzolamide-timoloL (COSOPT) 22.3-6.8 mg/mL ophthalmic solution Place 1 drop into both eyes 2 (two) times daily  lisinopriL (ZESTRIL) 20 MG tablet TAKE 1 TABLET BY MOUTH ONCE A DAY 90 tablet 3  metoprolol tartrate (LOPRESSOR) 25 MG tablet TAKE 1 TABLET BY MOUTH TWICE A DAY 180 tablet 3  multivitamin tablet Take 1 tablet by mouth once daily  omeprazole (PRILOSEC) 40 MG DR capsule Take 1 capsule by mouth once daily  rosuvastatin (CRESTOR) 5 MG tablet TAKE 1 TABLET BY MOUTH ONCE A DAY 90 tablet 3  spironolactone (ALDACTONE) 25 MG tablet TAKE 1/2 A TABLET (12.5 MG) BY MOUTH ONCE DAILY 90 tablet 1  tamsulosin (FLOMAX) 0.4 mg capsule Take 0.4 mg by mouth once daily.  polyethylene glycol (MIRALAX) powder One bottle for colonoscopy prep. Use as directed. 255 g 0   No current facility-administered medications for this visit.   Family History  Problem Relation Age of Onset  Heart failure Mother  Coronary Artery Disease (Blocked arteries around heart) Mother  Passed  Stroke Father  Colon polyps Father  Ischemic heart disease Father  Coronary Artery Disease (Blocked arteries around heart) Father  Passed  Diabetes Father  Dementia Father  Coronary Artery Disease (Blocked arteries around heart) Brother  Heart disease Brother  Coronary Artery Disease (Blocked  arteries around heart) Brother  heart anyerisum  Aortic aneurysm Brother  Coronary Artery Disease (Blocked arteries around heart) Maternal Grandmother  Myocardial Infarction (Heart attack) Maternal Grandfather  Myocardial Infarction (Heart attack) Paternal Grandfather  Prostate cancer Neg Hx  Kidney cancer Neg Hx  Bladder Cancer Neg Hx  Colon cancer Neg Hx  Breast cancer Neg Hx   Labs and Radiology:    May 23, 2009 EGD:  Gastric antral biopsies mild chronic gastritis with healing mucosal injury, negative for H. pylori.  Distal esophagus with changes consistent with reflux esophagitis. Intestinal metaplasia was not identified.  April 16, 2017 colonoscopy:  No polyps were identified. Scattered diverticuli.  Laboratory review July 04, 2021:  WBC 4.0 - 10.5 K/uL 6.0  RBC 4.22 - 5.81 MIL/uL 4.65  Hemoglobin 13.0 - 17.0 g/dL 14.3  HCT 39.0 - 52.0 % 40.8  MCV 80.0 - 100.0 fL 87.7  MCH 26.0 - 34.0 pg 30.8  MCHC 30.0 - 36.0 g/dL 35.0  RDW 11.5 - 15.5 % 12.8  Platelets 150 - 400 K/uL 279  nRBC 0.0 - 0.2 % 0.0  Neutrophils Relative % % 54  Neutro Abs 1.7 - 7.7 K/uL 3.2  Lymphocytes Relative % 29  Lymphs Abs 0.7 - 4.0 K/uL 1.7  Monocytes Relative % 12  Monocytes Absolute 0.1 - 1.0 K/uL 0.7  Eosinophils Relative % 4  Eosinophils Absolute 0.0 - 0.5 K/uL 0.2  Basophils Relative % 1  Basophils Absolute 0.0 - 0.1 K/uL 0.1  Immature Granulocytes % 0  Abs Immature Granulocytes 0.00 - 0.07 K/uL 0.01   Review of Systems  Constitutional: Negative for chills and fever.  Respiratory: Negative for cough.    Objective:  Physical Exam Constitutional:  Appearance: Normal appearance.  Cardiovascular:  Rate and Rhythm: Normal rate and regular rhythm.  Pulses: Normal pulses.  Heart sounds: Normal heart sounds.  Pulmonary:  Effort: Pulmonary effort is normal.  Breath sounds: Normal breath sounds.  Musculoskeletal:  Cervical back: Neck supple.  Neurological:  Mental Status: He is alert and oriented to person, place, and time.  Psychiatric:  Mood and Affect: Mood normal.  Behavior: Behavior normal.    Assessment:   Candidate for follow-up colonoscopy based on 2006 colon cancer involving the hepatic flexure, status post right hemicolectomy.  Candidate for follow-up upper endoscopy based on prior findings of esophagitis and longstanding PPI therapy.  Candidate for  abdominal aortic aneurysm screening ultrasound.  Plan:   Indications for screening AAA ultrasound reviewed. Request placed through [facility].  Preparation for the planned endoscopic procedures were reviewed with the patient by the staff.  Tentatively scheduled for April 24, 2022.   This note is partially prepared by Ledell Noss, CMA acting as a scribe in the presence of Dr. Hervey Ard, MD.   The documentation recorded by the scribe accurately reflects the service I personally performed and the decisions made by me.   Robert Bellow, MD FACS  Electronically signed by Mayer Masker, MD at 04/03/2022 7:09 AM EDT

## 2022-04-09 ENCOUNTER — Other Ambulatory Visit: Payer: Self-pay | Admitting: General Surgery

## 2022-04-09 DIAGNOSIS — Z8249 Family history of ischemic heart disease and other diseases of the circulatory system: Secondary | ICD-10-CM

## 2022-04-16 ENCOUNTER — Ambulatory Visit
Admission: RE | Admit: 2022-04-16 | Discharge: 2022-04-16 | Disposition: A | Discharge status: Home / Self Care | Payer: PPO | Source: Ambulatory Visit | Attending: General Surgery | Admitting: General Surgery

## 2022-04-16 DIAGNOSIS — Z8249 Family history of ischemic heart disease and other diseases of the circulatory system: Secondary | ICD-10-CM | POA: Diagnosis not present

## 2022-04-16 DIAGNOSIS — Z136 Encounter for screening for cardiovascular disorders: Secondary | ICD-10-CM | POA: Insufficient documentation

## 2022-04-18 ENCOUNTER — Other Ambulatory Visit: Payer: Self-pay | Admitting: General Surgery

## 2022-04-18 DIAGNOSIS — K7689 Other specified diseases of liver: Secondary | ICD-10-CM

## 2022-04-18 DIAGNOSIS — K769 Liver disease, unspecified: Secondary | ICD-10-CM

## 2022-04-19 ENCOUNTER — Other Ambulatory Visit: Payer: Self-pay | Admitting: Family Medicine

## 2022-04-19 DIAGNOSIS — K2100 Gastro-esophageal reflux disease with esophagitis, without bleeding: Secondary | ICD-10-CM

## 2022-04-21 ENCOUNTER — Encounter: Payer: Self-pay | Admitting: Family Medicine

## 2022-04-21 DIAGNOSIS — I77819 Aortic ectasia, unspecified site: Secondary | ICD-10-CM | POA: Insufficient documentation

## 2022-04-21 DIAGNOSIS — R160 Hepatomegaly, not elsewhere classified: Secondary | ICD-10-CM | POA: Insufficient documentation

## 2022-04-22 ENCOUNTER — Encounter: Payer: Self-pay | Admitting: Family Medicine

## 2022-04-22 ENCOUNTER — Ambulatory Visit (INDEPENDENT_AMBULATORY_CARE_PROVIDER_SITE_OTHER): Payer: PPO | Admitting: Family Medicine

## 2022-04-22 VITALS — BP 117/80 | HR 70 | Temp 98.2°F | Resp 16 | Ht 73.0 in | Wt 218.0 lb

## 2022-04-22 DIAGNOSIS — I5022 Chronic systolic (congestive) heart failure: Secondary | ICD-10-CM

## 2022-04-22 DIAGNOSIS — E782 Mixed hyperlipidemia: Secondary | ICD-10-CM | POA: Diagnosis not present

## 2022-04-22 DIAGNOSIS — R7303 Prediabetes: Secondary | ICD-10-CM

## 2022-04-22 DIAGNOSIS — K2100 Gastro-esophageal reflux disease with esophagitis, without bleeding: Secondary | ICD-10-CM

## 2022-04-22 DIAGNOSIS — Z0000 Encounter for general adult medical examination without abnormal findings: Secondary | ICD-10-CM

## 2022-04-22 DIAGNOSIS — I10 Essential (primary) hypertension: Secondary | ICD-10-CM

## 2022-04-22 NOTE — Patient Instructions (Signed)
Please review the attached list of medications and notify my office if there are any errors.   Please bring all of your medications to every appointment so we can make sure that our medication list is the same as yours.   You are due for a Tdap (tetanus-diptheria-pertussis vaccine) which protects you from tetanus and whooping cough. Please check with your insurance plan or pharmacy regarding coverage for this vaccine.   

## 2022-04-22 NOTE — Progress Notes (Signed)
I,Derek Jackson,acting as a scribe for Derek Huh, MD.,have documented all relevant documentation on the behalf of Derek Huh, MD,as directed by  Derek Huh, MD while in the presence of Derek Huh, MD.   Complete physical exam   Patient: Derek Jackson   DOB: 03/05/1951   71 y.o. Male  MRN: [id number] Visit Date: 04/22/2022  Today's healthcare provider: Lelon Huh, MD   Chief Complaint  Patient presents with   Annual Exam   Hyperlipidemia   Prediabetes   Subjective    Derek Jackson is a 72 y.o. male who presents today for a complete physical exam.  He reports consuming a general diet. Home exercise routine includes walking daily. He generally feels fairly well. He reports sleeping fairly well. He does not have additional problems to discuss today.  Had AWV with [facility] on 01/31/2022.   HPI  Hypertension, follow-up  BP Readings from Last 3 Encounters:  04/22/22 117/80  03/18/22 134/82  03/26/21 126/72   Wt Readings from Last 3 Encounters:  04/22/22 218 lb (98.9 kg)  03/18/22 214 lb (97.1 kg)  03/26/21 216 lb (98 kg)     He was last seen for hypertension 1  year  ago.  BP at that visit was 126/72. Management since that visit includes continue same medication.  He reports good compliance with treatment. He is not having side effects.  He is following a Regular diet. He is exercising. He does not smoke.  Use of agents associated with hypertension: none.   Outside blood pressures are only checked if he does not feel well. Symptoms: No chest pain No chest pressure  No palpitations No syncope  Yes dyspnea No orthopnea  No paroxysmal nocturnal dyspnea No lower extremity edema   Pertinent labs Lab Results  Component Value Date   NA 136 03/26/2021   K 4.7 03/26/2021   CREATININE 0.89 03/26/2021   EGFR 92 03/26/2021   GLUCOSE 103 (H) 03/26/2021   TSH 1.390 06/16/2019       ---------------------------------------------------------------------------------------------------   Lipid/Cholesterol, Follow-up  Last lipid panel Other pertinent labs  Lab Results  Component Value Date   CHOL 148 03/26/2021   HDL 47 03/26/2021   LDLCALC 73 03/26/2021   TRIG 167 (H) 03/26/2021   CHOLHDL 3.1 03/26/2021   Lab Results  Component Value Date   ALT 36 03/26/2021   AST 29 03/26/2021   PLT 279 07/04/2021   TSH 1.390 06/16/2019     He was last seen for this 1  year  ago.  Management since that visit includes continuing same medication.  He reports good compliance with treatment. He is not having side effects.   Symptoms: No chest pain No chest pressure/discomfort  Yes dyspnea No lower extremity edema  No numbness or tingling of extremity No orthopnea  No palpitations No paroxysmal nocturnal dyspnea  No speech difficulty No syncope   Current diet: in general, an "unhealthy" diet Current exercise: walking  The 10-year ASCVD risk score (Arnett DK, et al., 2019) is: 17.3%  ---------------------------------------------------------------------------------------------------   Prediabetes, Follow-up  Lab Results  Component Value Date   HGBA1C 5.9 (A) 03/26/2021   HGBA1C 6.1 (A) 08/08/2020   GLUCOSE 103 (H) 03/26/2021   GLUCOSE 125 (H) 01/17/2020   GLUCOSE 113 (H) 06/16/2019    Last seen for for this1  year  ago.  Management since that visit includes continuing lifestyle modifications. Current symptoms include none and have been stable.  Prior visit with dietician: no  Current diet: in general, an "unhealthy" diet Current exercise: walking  Pertinent Labs:    Component Value Date/Time   CHOL 148 03/26/2021 1105   TRIG 167 (H) 03/26/2021 1105   CHOLHDL 3.1 03/26/2021 1105   CREATININE 0.89 03/26/2021 1105    Wt Readings from Last 3 Encounters:  04/22/22 218 lb (98.9 kg)  03/18/22 214 lb (97.1 kg)  03/26/21 216 lb (98 kg)     -----------------------------------------------------------------------------------------   Past Medical History:  Diagnosis Date   BPH (benign prostatic hypertrophy)    COVID-19 04/15/2019   Esophagitis, reflux    Glaucoma    History of shingles 04/27/2015   Kidney stone    Rosacea    Systolic CHF ([place]) 5956   Past Surgical History:  Procedure Laterality Date   Abdominal wall mass excision  2010   Dr. Bary Castilla   CARDIAC CATHETERIZATION  07/2014   No blockages. per patient one side of heart was not functioning well, CHF-Dr. Nehemiah Massed   CHOLECYSTECTOMY  2005   Lapraroscopic   COLONOSCOPY  [id number]   COLONOSCOPY WITH PROPOFOL N/A 04/16/2017   Procedure: COLONOSCOPY WITH PROPOFOL;  Surgeon: Robert Bellow, MD;  Location: [facility] ENDOSCOPY;  Service: Endoscopy;  Laterality: N/A;   CT Scan of Abdomen  12/05/2008   Soft Tissue mass 2.42cm x 3.98 cm of Abdominal wall lateral to umbilicus, pathology shows fibromatosis. Multiple large liver cysts up to 9cm x 9cm   KNEE SURGERY  1994   Arthroscopy. Surgery by Dr. Marry Guan   RIGHT COLECTOMY  2006   [place]  1960's   UPPER GI ENDOSCOPY  2010   Social History   Socioeconomic History   Marital status: Significant Other    Spouse name: Not on file   Number of children: 3   Years of education: Not on file   Highest education level: Some college, no degree  Occupational History   Occupation: Employed    Comment: Works at [employer]: part time  Tobacco Use   Smoking status: Former    Packs/day: 1.00    Years: 8.00    Total pack years: 8.00    Types: Cigarettes    Quit date: 04/26/1990    Years since quitting: 32.0   Smokeless tobacco: Never  Vaping Use   Vaping Use: Never used  Substance and Sexual Activity   Alcohol use: Yes    Alcohol/week: 0.0 standard drinks of alcohol    Comment: 6 pack a month/socially   Drug use: No   Sexual activity: Yes    Birth  control/protection: None  Other Topics Concern   Not on file  Social History Narrative   Retired April 2017, still working 3 days a week   Social Determinants of Radio broadcast assistant Strain: Low Risk  (01/31/2022)   Overall Financial Resource Strain (CARDIA)    Difficulty of Paying Living Expenses: Not hard at all  Food Insecurity: No Food Insecurity (01/31/2022)   Hunger Vital Sign    Worried About Running Out of Food in the Last Year: Never true    [place] in the Last Year: Never true  Transportation Needs: No Transportation Needs (01/31/2022)   PRAPARE - Hydrologist (Medical): No    Lack of Transportation (Non-Medical): No  Physical Activity: Sufficiently Active (01/31/2022)   Exercise Vital Sign    Days of Exercise per Week: 6 days    Minutes  of Exercise per Session: 40 min  Stress: No Stress Concern Present (01/31/2022)   [place]    Feeling of Stress : Not at all  Social Connections: Moderately Isolated (01/31/2022)   Social Connection and Isolation Panel [NHANES]    Frequency of Communication with Friends and Family: Three times a week    Frequency of Social Gatherings with Friends and Family: Once a week    Attends Religious Services: Never    Marine scientist or Organizations: Not on file    Attends Archivist Meetings: Never    Marital Status: Living with partner  Intimate Partner Violence: Not At Risk (01/31/2022)   Humiliation, Afraid, Rape, and Kick questionnaire    Fear of Current or Ex-Partner: No    Emotionally Abused: No    Physically Abused: No    Sexually Abused: No   Family Status  Relation Name Status   Father  Deceased at age 66       Cause of death: CVA   Mother  Deceased at age 23       Cause of Death: CHF. was also prediabetic   MGF  Deceased       MI   PGM  Deceased       Cause of death: Complications of Diabetes   PGF  Deceased        MI   Brother  Deceased at age 58       Cause of Death: Heart Disease   Sister  Alive   Brother  Alive       PRE- Diabetes   Daughter  Alive       has had surgery for VSD   MGM  Deceased       OLD age   Neg Hx  (Not Specified)   Family History  Problem Relation Age of Onset   Colon polyps Father    Diabetes Father    CAD Father        had CABG   Stroke Father    Dementia Father    Congestive Heart Failure Mother    Heart attack Maternal Grandfather    Diabetes Paternal Grandmother    Heart attack Paternal Grandfather    Heart disease Brother        also had history of VSD   Aneurysm Brother    Prostate cancer Neg Hx    Bladder Cancer Neg Hx    Kidney cancer Neg Hx    No Known Allergies  Patient Care Team: Birdie Sons, MD as PCP - General (Family Medicine) Byrnett, [name], MD (General Surgery) Abbie Sons, MD as Consulting Physician (Urology) Corey Skains, MD as Consulting Physician (Cardiology) Pa, [place] (Optometry) Marchia Meiers, MD (Inactive) as Consulting Physician (Ophthalmology) Oneta Rack, MD (Dermatology)   Medications: Outpatient Medications Prior to Visit  Medication Sig   dorzolamide-timolol (COSOPT) 22.3-6.8 MG/ML ophthalmic solution 1 drop 2 (two) times daily.   lisinopril (PRINIVIL,ZESTRIL) 20 MG tablet Take 1 tablet by mouth daily.   metoprolol tartrate (LOPRESSOR) 25 MG tablet Take 1 tablet by mouth 2 (two) times daily.   omeprazole (PRILOSEC) 40 MG capsule TAKE 1 CAPSULE BY MOUTH ONCE DAILY   rosuvastatin (CRESTOR) 5 MG tablet Take 5 mg by mouth daily.   spironolactone (ALDACTONE) 25 MG tablet Take 12.5 mg by mouth daily.    tamsulosin (FLOMAX) 0.4 MG CAPS capsule Take 1 capsule (0.4 mg total)  by mouth daily.   [DISCONTINUED] vitamin B-12 (CYANOCOBALAMIN) 100 MCG tablet Take 3,000 mcg by mouth daily. (Patient not taking: Reported on 04/22/2022)   No facility-administered medications prior to visit.     Review of Systems  Constitutional:  Negative for appetite change, chills, fatigue and fever.  HENT:  Positive for tinnitus. Negative for congestion, ear pain, hearing loss, nosebleeds and trouble swallowing.   Eyes:  Negative for pain and visual disturbance.  Respiratory:  Positive for shortness of breath. Negative for cough and chest tightness.   Cardiovascular:  Negative for chest pain, palpitations and leg swelling.  Gastrointestinal:  Negative for abdominal pain, blood in stool, constipation, diarrhea, nausea and vomiting.  Endocrine: Negative for polydipsia, polyphagia and polyuria.  Genitourinary:  Positive for frequency. Negative for dysuria and flank pain.  Musculoskeletal:  Positive for arthralgias. Negative for back pain, joint swelling, myalgias and neck stiffness.  Skin:  Negative for color change, rash and wound.  Neurological:  Negative for dizziness, tremors, seizures, speech difficulty, weakness, light-headedness and headaches.  Psychiatric/Behavioral:  Negative for behavioral problems, confusion, decreased concentration, dysphoric mood and sleep disturbance. The patient is not nervous/anxious.   All other systems reviewed and are negative.     Objective     BP 117/80 (BP Location: Left Arm, Patient Position: Sitting, Cuff Size: Large)   Pulse 70   Temp 98.2 F (36.8 C) (Oral)   Resp 16   Ht _0  (1.854 m)   Wt 218 lb (98.9 kg)   SpO2 99% Comment: room air  BMI 28.76 kg/m     Physical Exam   General Appearance:    Well developed, well nourished male. Alert, cooperative, in no acute distress, appears stated age  Head:    Normocephalic, without obvious abnormality, atraumatic  Eyes:    PERRL, conjunctiva/corneas clear, EOM's intact, fundi    benign, both eyes       Ears:    Normal TM's and external ear canals, both ears  Nose:   Nares normal, septum midline, mucosa normal, no drainage   or sinus tenderness  Throat:   Lips, mucosa, and tongue normal;  teeth and gums normal  Neck:   Supple, symmetrical, trachea midline, no adenopathy;       thyroid:  No enlargement/tenderness/nodules; no carotid   bruit or JVD  Back:     Symmetric, no curvature, ROM normal, no CVA tenderness  Lungs:     Clear to auscultation bilaterally, respirations unlabored  Chest wall:    No tenderness or deformity  Heart:    Normal heart rate. Normal rhythm. No murmurs, rubs, or gallops.  S1 and S2 normal  Abdomen:     Soft, non-tender, bowel sounds active all four quadrants,    no masses, no organomegaly  Genitalia:    deferred  Rectal:    deferred  Extremities:   All extremities are intact. No cyanosis or edema  Pulses:   2+ and symmetric all extremities  Skin:   Skin color, texture, turgor normal, no rashes or lesions  Lymph nodes:   Cervical, supraclavicular, and axillary nodes normal  Neurologic:   CNII-XII intact. Normal strength, sensation and reflexes      throughout     Last depression screening scores    04/22/2022    2:15 PM 01/31/2022    2:38 PM 03/26/2021   10:30 AM  PHQ 2/9 Scores  PHQ - 2 Score 1 0 3  PHQ- 9 Score 2 0 7   Last  fall risk screening    04/22/2022    2:15 PM  [place] in the past year? 0  Number falls in past yr: 0  Injury with Fall? 0  Risk for fall due to : No Fall Risks  Follow up Falls evaluation completed   Last Audit-C alcohol use screening    01/31/2022    2:37 PM  Alcohol Use Disorder Test (AUDIT)  1. How often do you have a drink containing alcohol? 1  2. How many drinks containing alcohol do you have on a typical day when you are drinking? 0  3. How often do you have six or more drinks on one occasion? 0  AUDIT-C Score 1   A score of 3 or more in women, and 4 or more in men indicates increased risk for alcohol abuse, EXCEPT if all of the points are from question 1   No results found for any visits on 04/22/22.  Assessment & Plan    Routine Health Maintenance and Physical Exam  Exercise Activities  and Dietary recommendations  Goals      DIET - EAT MORE FRUITS AND VEGETABLES     DIET - INCREASE WATER INTAKE     Recommend increasing water intake to 4 glasses a day.         Immunization History  Administered Date(s) Administered   H1N1 07/13/2008   Influenza, High Dose Seasonal PF 06/11/2019, 05/15/2020   Influenza-Unspecified 06/04/2018   PFIZER Comirnaty(Gray Top)Covid-19 Tri-Sucrose Vaccine 10/11/2019, 11/01/2019   PFIZER(Purple Top)SARS-COV-2 Vaccination 10/11/2019, 11/01/2019, 06/12/2020   Pneumococcal Conjugate-13 12/18/2015   Pneumococcal Polysaccharide-23 12/23/2016   Td 03/21/1999, 11/25/2003   Zoster, Live 10/22/2010    Health Maintenance  Topic Date Due   Zoster Vaccines- Shingrix (1 of 2) Never done   TETANUS/TDAP  11/24/2013   COVID-19 Vaccine (6 - Pfizer series) 08/07/2020   COLONOSCOPY (Pts 45-2yr Insurance coverage will need to be confirmed)  04/16/2022   INFLUENZA VACCINE  03/26/2022   Pneumonia Vaccine 71 Years old  Completed   Hepatitis C Screening  Completed   HPV VACCINES  Aged Out    Discussed health benefits of physical activity, and encouraged him to engage in regular exercise appropriate for his age and condition.   2. Primary hypertension Well controlled. Continue current medications.    3. Hyperlipemia, mixed He is tolerating rosuvastatin well with no adverse effects.   - CBC - Comprehensive metabolic panel - Lipid panel  4. Prediabetes  - Hemoglobin A1c  5. Chronic systolic congestive heart failure ([place] Well compensated, EF up to 45% on recent echo at Dr. KAlveria Apley 6. Gastroesophageal reflux disease with esophagitis without hemorrhage Well controlled on omeprazole.      The entirety of the information documented in the History of Present Illness, Review of Systems and Physical Exam were personally obtained by me. Portions of this information were initially documented by the CMA and reviewed by me for thoroughness and  accuracy.     DLelon Huh MD  [employer] [phone](phone) [phone](fax)  [place]

## 2022-04-24 ENCOUNTER — Ambulatory Visit: Payer: PPO | Admitting: Anesthesiology

## 2022-04-24 ENCOUNTER — Ambulatory Visit
Admission: RE | Admit: 2022-04-24 | Discharge: 2022-04-24 | Disposition: A | Discharge status: Home / Self Care | Payer: PPO | Attending: General Surgery | Admitting: General Surgery

## 2022-04-24 ENCOUNTER — Encounter: Payer: Self-pay | Admitting: General Surgery

## 2022-04-24 ENCOUNTER — Encounter
Admission: RE | Disposition: A | Discharge status: Home / Self Care | Payer: Self-pay | Source: Home / Self Care | Attending: General Surgery

## 2022-04-24 DIAGNOSIS — Z85038 Personal history of other malignant neoplasm of large intestine: Secondary | ICD-10-CM | POA: Diagnosis not present

## 2022-04-24 DIAGNOSIS — I110 Hypertensive heart disease with heart failure: Secondary | ICD-10-CM | POA: Diagnosis not present

## 2022-04-24 DIAGNOSIS — Z79899 Other long term (current) drug therapy: Secondary | ICD-10-CM | POA: Insufficient documentation

## 2022-04-24 DIAGNOSIS — K317 Polyp of stomach and duodenum: Secondary | ICD-10-CM | POA: Insufficient documentation

## 2022-04-24 DIAGNOSIS — C189 Malignant neoplasm of colon, unspecified: Secondary | ICD-10-CM | POA: Diagnosis not present

## 2022-04-24 DIAGNOSIS — D126 Benign neoplasm of colon, unspecified: Secondary | ICD-10-CM | POA: Diagnosis not present

## 2022-04-24 DIAGNOSIS — I5022 Chronic systolic (congestive) heart failure: Secondary | ICD-10-CM | POA: Diagnosis not present

## 2022-04-24 DIAGNOSIS — Z9049 Acquired absence of other specified parts of digestive tract: Secondary | ICD-10-CM | POA: Insufficient documentation

## 2022-04-24 DIAGNOSIS — Z980 Intestinal bypass and anastomosis status: Secondary | ICD-10-CM | POA: Insufficient documentation

## 2022-04-24 DIAGNOSIS — D123 Benign neoplasm of transverse colon: Secondary | ICD-10-CM | POA: Insufficient documentation

## 2022-04-24 DIAGNOSIS — K3189 Other diseases of stomach and duodenum: Secondary | ICD-10-CM | POA: Diagnosis not present

## 2022-04-24 DIAGNOSIS — Z8616 Personal history of COVID-19: Secondary | ICD-10-CM | POA: Insufficient documentation

## 2022-04-24 DIAGNOSIS — Z1211 Encounter for screening for malignant neoplasm of colon: Secondary | ICD-10-CM | POA: Diagnosis not present

## 2022-04-24 DIAGNOSIS — D124 Benign neoplasm of descending colon: Secondary | ICD-10-CM | POA: Diagnosis not present

## 2022-04-24 DIAGNOSIS — K30 Functional dyspepsia: Secondary | ICD-10-CM | POA: Diagnosis not present

## 2022-04-24 DIAGNOSIS — K219 Gastro-esophageal reflux disease without esophagitis: Secondary | ICD-10-CM | POA: Insufficient documentation

## 2022-04-24 DIAGNOSIS — N400 Enlarged prostate without lower urinary tract symptoms: Secondary | ICD-10-CM | POA: Diagnosis not present

## 2022-04-24 DIAGNOSIS — Z87891 Personal history of nicotine dependence: Secondary | ICD-10-CM | POA: Diagnosis not present

## 2022-04-24 HISTORY — DX: Personal history of urinary calculi: Z87.442

## 2022-04-24 HISTORY — PX: ESOPHAGOGASTRODUODENOSCOPY (EGD) WITH PROPOFOL: SHX5813

## 2022-04-24 HISTORY — PX: COLONOSCOPY WITH PROPOFOL: SHX5780

## 2022-04-24 SURGERY — ESOPHAGOGASTRODUODENOSCOPY (EGD) WITH PROPOFOL
Anesthesia: General

## 2022-04-24 MED ORDER — PROPOFOL 10 MG/ML IV BOLUS
INTRAVENOUS | Status: DC | PRN
  Administered 2022-04-24: 80 mg via INTRAVENOUS
  Administered 2022-04-24 (×2): 20 mg via INTRAVENOUS
  Administered 2022-04-24: 10 mg via INTRAVENOUS
  Administered 2022-04-24 (×2): 20 mg via INTRAVENOUS
  Administered 2022-04-24: 30 mg via INTRAVENOUS

## 2022-04-24 MED ORDER — EPHEDRINE 5 MG/ML INJ
INTRAVENOUS | Status: AC
  Filled 2022-04-24: qty 5

## 2022-04-24 MED ORDER — EPHEDRINE SULFATE (PRESSORS) 50 MG/ML IJ SOLN
INTRAMUSCULAR | Status: DC | PRN
  Administered 2022-04-24: 10 mg via INTRAVENOUS

## 2022-04-24 MED ORDER — SODIUM CHLORIDE 0.9 % IV SOLN
INTRAVENOUS | Status: DC
  Administered 2022-04-24: 1000 mL via INTRAVENOUS

## 2022-04-24 MED ORDER — PROPOFOL 500 MG/50ML IV EMUL
INTRAVENOUS | Status: DC | PRN
  Administered 2022-04-24: 130 ug/kg/min via INTRAVENOUS

## 2022-04-24 MED ORDER — DEXMEDETOMIDINE HCL IN NACL 200 MCG/50ML IV SOLN
INTRAVENOUS | Status: DC | PRN
  Administered 2022-04-24: 12 ug via INTRAVENOUS

## 2022-04-24 NOTE — Op Note (Signed)
[facility] Gastroenterology Patient Name: Derek Jackson Procedure Date: 04/24/2022 10:33 AM MRN: [id number] Account #: [id number] Date of Birth: 07/09/1951 Admit Type: Outpatient Age: 71 Room: [facility] ENDO ROOM 1 Gender: Male Note Status: Finalized Instrument Name: Peds Colonoscope [id number] Procedure:             Colonoscopy Indications:           High risk colon cancer surveillance: Personal history                         of colon cancer Providers:             Robert Bellow, MD Referring MD:          Kirstie Peri. Caryn Section, MD (Referring MD) Medicines:             Propofol per Anesthesia Complications:         No immediate complications. Procedure:             Pre-Anesthesia Assessment:                        - Prior to the procedure, a History and Physical was                         performed, and patient medications, allergies and                         sensitivities were reviewed. The patient's tolerance                         of previous anesthesia was reviewed.                        - The risks and benefits of the procedure and the                         sedation options and risks were discussed with the                         patient. All questions were answered and informed                         consent was obtained.                        After obtaining informed consent, the colonoscope was                         passed under direct vision. Throughout the procedure,                         the patient's blood pressure, pulse, and oxygen                         saturations were monitored continuously. The                         Colonoscope was introduced through the anus and  advanced to the the ileocolonic anastomosis. The                         colonoscopy was performed without difficulty. The                         patient tolerated the procedure well. The quality of                         the bowel preparation was  excellent. Findings:      Three sessile polyps were found in the mid transverse colon. The polyps       were 5 mm in size. These were biopsied with a cold forceps for histology.      Two pedunculated polyps were found in the distal sigmoid colon. The       polyps were 7 to 15 mm in size. These polyps were removed with a hot       snare. Resection and retrieval were complete. To prevent bleeding       post-intervention, one hemostatic clip was successfully placed (MR       conditional). There was no bleeding during, or at the end, of the       procedure.      Two sessile polyps were found in the mid descending colon. The polyps       were 8 to 10 mm in size. These polyps were removed with a hot snare.       Resection and retrieval were complete.      The retroflexed view of the distal rectum and anal verge was normal and       showed no anal or rectal abnormalities. Impression:            - Three 5 mm polyps in the mid transverse colon.                         Biopsied.                        - Two 7 to 15 mm polyps in the distal sigmoid colon,                         removed with a hot snare. Resected and retrieved.                        - Two 8 to 10 mm polyps in the mid descending colon,                         removed with a hot snare. Resected and retrieved.                        - The distal rectum and anal verge are normal on                         retroflexion view. Recommendation:        - Telephone endoscopist for pathology results in 1                         week. Procedure Code(s):     --- Professional ---  45385, Colonoscopy, flexible; with removal of                         tumor(s), polyp(s), or other lesion(s) by snare                         technique                        45380, 93, Colonoscopy, flexible; with biopsy, single                         or multiple Diagnosis Code(s):     --- Professional ---                        X41.287,  Personal history of other malignant neoplasm                         of large intestine                        K63.5, Polyp of colon CPT copyright 2019 American Medical Association. All rights reserved. The codes documented in this report are preliminary and upon coder review may  be revised to meet current compliance requirements. Robert Bellow, MD 04/24/2022 11:30:30 AM This report has been signed electronically. Number of Addenda: 0 Note Initiated On: 04/24/2022 10:33 AM Scope Withdrawal Time: 0 hours 24 minutes 34 seconds  Total Procedure Duration: 0 hours 27 minutes 48 seconds  Estimated Blood Loss:  Estimated blood loss was minimal.      [facility]

## 2022-04-24 NOTE — Anesthesia Postprocedure Evaluation (Signed)
Anesthesia Post Note  Patient: Derek Jackson  Procedure(s) Performed: ESOPHAGOGASTRODUODENOSCOPY (EGD) WITH PROPOFOL COLONOSCOPY WITH PROPOFOL  Patient location during evaluation: Endoscopy Anesthesia Type: General Level of consciousness: awake and alert Pain management: pain level controlled Vital Signs Assessment: post-procedure vital signs reviewed and stable Respiratory status: spontaneous breathing, nonlabored ventilation and respiratory function stable Cardiovascular status: blood pressure returned to baseline and stable Postop Assessment: no apparent nausea or vomiting Anesthetic complications: no   No notable events documented.   Last Vitals:  Vitals:   04/24/22 1140 04/24/22 1150  BP: 94/77 113/81  Pulse: 94 70  Resp: 13 15  Temp:    SpO2: 97% 98%    Last Pain:  Vitals:   04/24/22 1150  TempSrc:   PainSc: 0-No pain                 [place] Ouch

## 2022-04-24 NOTE — H&P (Addendum)
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 For follow up exam. Tolerated prep well. Past history of esophagitis on chronic PPI.   Medications Prior to Admission  Medication Sig Dispense Refill Last Dose   dorzolamide-timolol (COSOPT) 22.3-6.8 MG/ML ophthalmic solution 1 drop 2 (two) times daily.   04/24/2022   lisinopril (PRINIVIL,ZESTRIL) 20 MG tablet Take 1 tablet by mouth daily.   04/23/2022   metoprolol tartrate (LOPRESSOR) 25 MG tablet Take 1 tablet by mouth 2 (two) times daily.   04/24/2022   omeprazole (PRILOSEC) 40 MG capsule TAKE 1 CAPSULE BY MOUTH ONCE DAILY 90 capsule 4 04/23/2022   rosuvastatin (CRESTOR) 5 MG tablet Take 5 mg by mouth daily.   04/23/2022   spironolactone (ALDACTONE) 25 MG tablet Take 12.5 mg by mouth daily.    04/23/2022   tamsulosin (FLOMAX) 0.4 MG CAPS capsule Take 1 capsule (0.4 mg total) by mouth daily. 90 capsule 2 04/23/2022   No Known Allergies Past Medical History:  Diagnosis Date   BPH (benign prostatic hypertrophy)    COVID-19 04/15/2019   Esophagitis, reflux    Glaucoma    History of 2019 novel coronavirus disease (COVID-19) 04/20/2019   History of kidney stones    History of shingles 04/27/2015   Rosacea    Systolic CHF ([place]) 9150   Past Surgical History:  Procedure Laterality Date   Abdominal wall mass excision  2010   Dr. Bary Jackson   CARDIAC CATHETERIZATION  07/2014   No blockages. per patient one side of heart was not functioning well, CHF-Dr. Nehemiah Jackson   CHOLECYSTECTOMY  2005   Lapraroscopic   COLON SURGERY     COLONOSCOPY  [id number]   COLONOSCOPY WITH PROPOFOL N/A 04/16/2017   Procedure: COLONOSCOPY WITH PROPOFOL;  Surgeon: Derek Bellow, MD;  Location: [facility] ENDOSCOPY;  Service: Endoscopy;  Laterality: N/A;   CT Scan of Abdomen  12/05/2008   Soft Tissue mass 2.42cm x 3.98 cm of Abdominal wall lateral to umbilicus, pathology shows fibromatosis. Multiple large  liver cysts up to 9cm x 9cm   KNEE SURGERY  1994   Arthroscopy. Surgery by Dr. Marry Jackson   RIGHT COLECTOMY  2006   Dr.   TONSILLECTOMY     TONSILLECTOMY AND ADENOIDECTOMY  1960's   UPPER GI ENDOSCOPY  2010   Social History   Socioeconomic History   Marital status: Significant Other    Spouse name: Not on file   Number of children: 3   Years of education: Not on file   Highest education level: Some college, no degree  Occupational History   Occupation: Employed    Comment: Works at [employer]: part time  Tobacco Use   Smoking status: Former    Packs/day: 1.00    Years: 8.00    Total pack years: 8.00    Types: Cigarettes    Quit date: 04/26/1990    Years since quitting: 32.0   Smokeless tobacco: Never  Vaping Use   Vaping Use: Never used  Substance and Sexual Activity   Alcohol use: Yes    Alcohol/week: 0.0 standard drinks of alcohol    Comment: 6 pack a month/socially   Drug use: No   Sexual activity: Yes    Birth control/protection: None  Other Topics Concern   Not on file  Social History Narrative   Retired April 2017, still working 3 days a week  Social Determinants of Health   Financial Resource Strain: Low Risk  (01/31/2022)   Overall Financial Resource Strain (CARDIA)    Difficulty of Paying Living Expenses: Not hard at all  Food Insecurity: No Food Insecurity (01/31/2022)   Hunger Vital Sign    Worried About Running Out of Food in the Last Year: Never true    Ran Out of Food in the Last Year: Never true  Transportation Needs: No Transportation Needs (01/31/2022)   PRAPARE - Hydrologist (Medical): No    Lack of Transportation (Non-Medical): No  Physical Activity: Sufficiently Active (01/31/2022)   Exercise Vital Sign    Days of Exercise per Week: 6 days    Minutes of Exercise per Session: 40 min  Stress: No Stress Concern Present (01/31/2022)   [place]    Feeling of Stress : Not at all  Social Connections: Moderately Isolated (01/31/2022)   Social Connection and Isolation Panel [NHANES]    Frequency of Communication with Friends and Family: Three times a week    Frequency of Social Gatherings with Friends and Family: Once a week    Attends Religious Services: Never    Marine scientist or Organizations: Not on file    Attends Archivist Meetings: Never    Marital Status: Living with partner  Intimate Partner Violence: Not At Risk (01/31/2022)   Humiliation, Afraid, Rape, and Kick questionnaire    Fear of Current or Ex-Partner: No    Emotionally Abused: No    Physically Abused: No    Sexually Abused: No   Social History   Social History Narrative   Retired April 2017, still working 3 days a week     ROS: Negative.     PE: HEENT: Negative. Lungs: Clear. Cardio: RR.   Assessment/Plan:  Proceed with planned endoscopy.   Derek Jackson  04/24/2022

## 2022-04-24 NOTE — Op Note (Addendum)
[facility]astroenterology Patient Name: Derek Jackson Procedure Date: 04/24/2022 10:34 AM MRN: [id number] Account #: [id number] Date of Birth: 13-Oct-1950 Admit Type: Outpatient Age: 71 Room: [facility] ENDO ROOM 1 Gender: Male Note Status: Finalized Instrument Name: Upper Endoscope [id number] Procedure:             Upper GI endoscopy Indications:           Indigestion Providers:             Robert Bellow, MD Referring MD:          Kirstie Peri. Caryn Section, MD (Referring MD) Medicines:             Propofol per Anesthesia Complications:         No immediate complications. Procedure:             Pre-Anesthesia Assessment:                        - Prior to the procedure, a History and Physical was                         performed, and patient medications, allergies and                         sensitivities were reviewed. The patient's tolerance                         of previous anesthesia was reviewed.                        - The risks and benefits of the procedure and the                         sedation options and risks were discussed with the                         patient. All questions were answered and informed                         consent was obtained.                        After obtaining informed consent, the endoscope was                         passed under direct vision. Throughout the procedure,                         the patient's blood pressure, pulse, and oxygen                         saturations were monitored continuously. The Endoscope                         was introduced through the mouth, and advanced to the                         third part of duodenum. The upper GI endoscopy was  accomplished without difficulty. The patient tolerated                         the procedure well. Findings:      The esophagus was normal.      The stomach was normal.      Multiple 5 to 10 mm sessile polyps with no bleeding were found  in the       duodenal bulb. Biopsies were taken with a cold forceps for histology. Impression:            - Normal esophagus.                        - Normal stomach.                        - Multiple duodenal polyps. Biopsied. Recommendation:        - Telephone endoscopist for pathology results in 1                         week. Procedure Code(s):     --- Professional ---                        [phone], Esophagogastroduodenoscopy, flexible,                         transoral; with biopsy, single or multiple Diagnosis Code(s):     --- Professional ---                        K31.7, Polyp of stomach and duodenum                        K30, Functional dyspepsia CPT copyright 2019 American Medical Association. All rights reserved. The codes documented in this report are preliminary and upon coder review may  be revised to meet current compliance requirements. Robert Bellow, MD 04/24/2022 10:56:32 AM This report has been signed electronically. Number of Addenda: 0 Note Initiated On: 04/24/2022 10:34 AM Estimated Blood Loss:  Estimated blood loss was minimal.      [facility]

## 2022-04-24 NOTE — Transfer of Care (Signed)
Immediate Anesthesia Transfer of Care Note  Patient: Derek Jackson  Procedure(s) Performed: ESOPHAGOGASTRODUODENOSCOPY (EGD) WITH PROPOFOL COLONOSCOPY WITH PROPOFOL  Patient Location: PACU and Endoscopy Unit  Anesthesia Type:General  Level of Consciousness: drowsy and patient cooperative  Airway & Oxygen Therapy: Patient Spontanous Breathing  Post-op Assessment: Report given to RN and Post -op Vital signs reviewed and stable  Post vital signs: Reviewed and stable  Last Vitals:  Vitals Value Taken Time  BP 96/76 04/24/22 1130  Temp 36.7 C 04/24/22 1130  Pulse 75 04/24/22 1133  Resp 14 04/24/22 1133  SpO2 96 % 04/24/22 1133  Vitals shown include unvalidated device data.  Last Pain:  Vitals:   04/24/22 1130  TempSrc: Tympanic  PainSc: Asleep         Complications: No notable events documented.

## 2022-04-24 NOTE — Anesthesia Preprocedure Evaluation (Addendum)
Anesthesia Evaluation  Patient identified by MRN, date of birth, ID band Patient awake    Reviewed: Allergy & Precautions, NPO status , Patient's Chart, lab work & pertinent test results, reviewed documented beta blocker date and time   Airway Mallampati: III  TM Distance: >3 FB Neck ROM: full    Dental no notable dental hx.    Pulmonary neg pulmonary ROS, former smoker,    Pulmonary exam normal        Cardiovascular Exercise Tolerance: Good hypertension, Pt. on home beta blockers and Pt. on medications +CHF  Normal cardiovascular exam+ dysrhythmias (LBBB)   Chronic systolic congestive heart failure (HCC) Well compensated, EF up to 45% on recent echo at Dr. Alveria Apley   Neuro/Psych [place] negative neurological ROS     GI/Hepatic Neg liver ROS, GERD  Medicated,  Endo/Other  negative endocrine ROS  Renal/GU negative Renal ROS  negative genitourinary   Musculoskeletal   Abdominal Normal abdominal exam  (+)   Peds  Hematology negative hematology ROS (+)   Anesthesia Other Findings Past Medical History: No date: BPH (benign prostatic hypertrophy) 04/15/2019: COVID-19 No date: Esophagitis, reflux No date: Glaucoma 04/20/2019: History of 2019 novel coronavirus disease (COVID-19) No date: History of kidney stones 04/27/2015: History of shingles No date: Rosacea 3762: Systolic CHF [facility])  Past Surgical History: 2010: Abdominal wall mass excision     Comment:  Dr. Bary Castilla 07/2014: CARDIAC CATHETERIZATION     Comment:  No blockages. per patient one side of heart was not               functioning well, CHF-Dr. Nehemiah Massed 2005: CHOLECYSTECTOMY     Comment:  Lapraroscopic No date: COLON SURGERY 2010,2013: COLONOSCOPY 04/16/2017: COLONOSCOPY WITH PROPOFOL; N/A     Comment:  Procedure: COLONOSCOPY WITH PROPOFOL;  Surgeon: Robert Bellow, MD;  Location: [facility] ENDOSCOPY;  Service:                Endoscopy;  Laterality: N/A; 12/05/2008: CT Scan of Abdomen     Comment:  Soft Tissue mass 2.42cm x 3.98 cm of Abdominal wall               lateral to umbilicus, pathology shows fibromatosis.               Multiple large liver cysts up to 9cm x 9cm 1994: KNEE SURGERY     Comment:  Arthroscopy. Surgery by Dr. Marry Guan 2006: RIGHT COLECTOMY     Comment:  Dr.Byrnett No date: TONSILLECTOMY 1960's: TONSILLECTOMY AND ADENOIDECTOMY 2010: UPPER GI ENDOSCOPY  BMI    Body Mass Index: 27.89 kg/m      Reproductive/Obstetrics negative OB ROS                            Anesthesia Physical Anesthesia Plan  ASA: 2  Anesthesia Plan: General   Post-op Pain Management: Minimal or no pain anticipated   Induction: Intravenous  PONV Risk Score and Plan: Propofol infusion and TIVA  Airway Management Planned: Natural Airway  Additional Equipment:   Intra-op Plan:   Post-operative Plan:   Informed Consent: I have reviewed the patients History and Physical, chart, labs and discussed the procedure including the risks, benefits and alternatives for the proposed anesthesia with the patient or authorized representative who has indicated his/her understanding and acceptance.     Dental advisory given  Plan Discussed  with: Anesthesiologist, CRNA and Surgeon  Anesthesia Plan Comments:        Anesthesia Quick Evaluation

## 2022-04-25 ENCOUNTER — Ambulatory Visit
Admission: RE | Admit: 2022-04-25 | Discharge: 2022-04-25 | Disposition: A | Discharge status: Home / Self Care | Payer: PPO | Source: Ambulatory Visit | Attending: General Surgery | Admitting: General Surgery

## 2022-04-25 ENCOUNTER — Encounter: Payer: Self-pay | Admitting: General Surgery

## 2022-04-25 DIAGNOSIS — K7689 Other specified diseases of liver: Secondary | ICD-10-CM | POA: Diagnosis not present

## 2022-04-25 DIAGNOSIS — K769 Liver disease, unspecified: Secondary | ICD-10-CM | POA: Insufficient documentation

## 2022-04-25 DIAGNOSIS — K760 Fatty (change of) liver, not elsewhere classified: Secondary | ICD-10-CM | POA: Diagnosis not present

## 2022-04-25 DIAGNOSIS — N281 Cyst of kidney, acquired: Secondary | ICD-10-CM | POA: Diagnosis not present

## 2022-04-25 DIAGNOSIS — D1800 Hemangioma unspecified site: Secondary | ICD-10-CM | POA: Diagnosis not present

## 2022-04-25 DIAGNOSIS — Z85038 Personal history of other malignant neoplasm of large intestine: Secondary | ICD-10-CM | POA: Diagnosis not present

## 2022-04-25 LAB — COMPREHENSIVE METABOLIC PANEL
ALT: 52 IU/L — ABNORMAL HIGH (ref 0–44)
AST: 38 IU/L (ref 0–40)
Albumin/Globulin Ratio: 2.4 — ABNORMAL HIGH (ref 1.2–2.2)
Albumin: 4.8 g/dL (ref 3.8–4.8)
Alkaline Phosphatase: 92 IU/L (ref 44–121)
BUN/Creatinine Ratio: 15 (ref 10–24)
BUN: 13 mg/dL (ref 8–27)
Bilirubin Total: 0.8 mg/dL (ref 0.0–1.2)
CO2: 22 mmol/L (ref 20–29)
Calcium: 10.3 mg/dL — ABNORMAL HIGH (ref 8.6–10.2)
Chloride: 101 mmol/L (ref 96–106)
Creatinine, Ser: 0.86 mg/dL (ref 0.76–1.27)
Globulin, Total: 2 g/dL (ref 1.5–4.5)
Glucose: 104 mg/dL — ABNORMAL HIGH (ref 70–99)
Potassium: 4.6 mmol/L (ref 3.5–5.2)
Sodium: 137 mmol/L (ref 134–144)
Total Protein: 6.8 g/dL (ref 6.0–8.5)
eGFR: 93 mL/min/{1.73_m2} (ref >59)

## 2022-04-25 LAB — LIPID PANEL
Chol/HDL Ratio: 3.4 ratio (ref 0.0–5.0)
Cholesterol, Total: 163 mg/dL (ref 100–199)
HDL: 48 mg/dL (ref >39)
LDL Chol Calc (NIH): 82 mg/dL (ref 0–99)
Triglycerides: 199 mg/dL — ABNORMAL HIGH (ref 0–149)
VLDL Cholesterol Cal: 33 mg/dL (ref 5–40)

## 2022-04-25 LAB — HEMOGLOBIN A1C
Est. average glucose Bld gHb Est-mCnc: 126 mg/dL
Hgb A1c MFr Bld: 6 % — ABNORMAL HIGH (ref 4.8–5.6)

## 2022-04-25 LAB — CBC
Hematocrit: 46.1 % (ref 37.5–51.0)
Hemoglobin: 15.4 g/dL (ref 13.0–17.7)
MCH: 31.4 pg (ref 26.6–33.0)
MCHC: 33.4 g/dL (ref 31.5–35.7)
MCV: 94 fL (ref 79–97)
Platelets: 323 10*3/uL (ref 150–450)
RBC: 4.91 x10E6/uL (ref 4.14–5.80)
RDW: 13.1 % (ref 11.6–15.4)
WBC: 7.9 10*3/uL (ref 3.4–10.8)

## 2022-04-25 LAB — SURGICAL PATHOLOGY

## 2022-04-25 MED ORDER — GADOBUTROL 1 MMOL/ML IV SOLN
9.0000 mL | Freq: Once | INTRAVENOUS | Status: AC | PRN
  Administered 2022-04-25: 9 mL via INTRAVENOUS

## 2022-06-11 DIAGNOSIS — M5432 Sciatica, left side: Secondary | ICD-10-CM | POA: Diagnosis not present

## 2022-06-11 DIAGNOSIS — M9905 Segmental and somatic dysfunction of pelvic region: Secondary | ICD-10-CM | POA: Diagnosis not present

## 2022-06-11 DIAGNOSIS — M5416 Radiculopathy, lumbar region: Secondary | ICD-10-CM | POA: Diagnosis not present

## 2022-06-11 DIAGNOSIS — M9903 Segmental and somatic dysfunction of lumbar region: Secondary | ICD-10-CM | POA: Diagnosis not present

## 2022-06-17 DIAGNOSIS — M9903 Segmental and somatic dysfunction of lumbar region: Secondary | ICD-10-CM | POA: Diagnosis not present

## 2022-06-17 DIAGNOSIS — M5432 Sciatica, left side: Secondary | ICD-10-CM | POA: Diagnosis not present

## 2022-06-17 DIAGNOSIS — M9905 Segmental and somatic dysfunction of pelvic region: Secondary | ICD-10-CM | POA: Diagnosis not present

## 2022-06-17 DIAGNOSIS — M5416 Radiculopathy, lumbar region: Secondary | ICD-10-CM | POA: Diagnosis not present

## 2022-06-18 DIAGNOSIS — M9903 Segmental and somatic dysfunction of lumbar region: Secondary | ICD-10-CM | POA: Diagnosis not present

## 2022-06-18 DIAGNOSIS — M9905 Segmental and somatic dysfunction of pelvic region: Secondary | ICD-10-CM | POA: Diagnosis not present

## 2022-06-18 DIAGNOSIS — M5432 Sciatica, left side: Secondary | ICD-10-CM | POA: Diagnosis not present

## 2022-06-18 DIAGNOSIS — M5416 Radiculopathy, lumbar region: Secondary | ICD-10-CM | POA: Diagnosis not present

## 2022-06-19 DIAGNOSIS — M5432 Sciatica, left side: Secondary | ICD-10-CM | POA: Diagnosis not present

## 2022-06-19 DIAGNOSIS — M9905 Segmental and somatic dysfunction of pelvic region: Secondary | ICD-10-CM | POA: Diagnosis not present

## 2022-06-19 DIAGNOSIS — M9903 Segmental and somatic dysfunction of lumbar region: Secondary | ICD-10-CM | POA: Diagnosis not present

## 2022-06-19 DIAGNOSIS — M5416 Radiculopathy, lumbar region: Secondary | ICD-10-CM | POA: Diagnosis not present

## 2022-07-01 DIAGNOSIS — M5416 Radiculopathy, lumbar region: Secondary | ICD-10-CM | POA: Diagnosis not present

## 2022-07-01 DIAGNOSIS — M9903 Segmental and somatic dysfunction of lumbar region: Secondary | ICD-10-CM | POA: Diagnosis not present

## 2022-07-01 DIAGNOSIS — M5432 Sciatica, left side: Secondary | ICD-10-CM | POA: Diagnosis not present

## 2022-07-01 DIAGNOSIS — M9905 Segmental and somatic dysfunction of pelvic region: Secondary | ICD-10-CM | POA: Diagnosis not present

## 2022-07-08 DIAGNOSIS — H401132 Primary open-angle glaucoma, bilateral, moderate stage: Secondary | ICD-10-CM | POA: Diagnosis not present

## 2022-07-09 DIAGNOSIS — M5416 Radiculopathy, lumbar region: Secondary | ICD-10-CM | POA: Diagnosis not present

## 2022-07-09 DIAGNOSIS — M9903 Segmental and somatic dysfunction of lumbar region: Secondary | ICD-10-CM | POA: Diagnosis not present

## 2022-07-09 DIAGNOSIS — M5432 Sciatica, left side: Secondary | ICD-10-CM | POA: Diagnosis not present

## 2022-07-09 DIAGNOSIS — M9905 Segmental and somatic dysfunction of pelvic region: Secondary | ICD-10-CM | POA: Diagnosis not present

## 2022-07-17 DIAGNOSIS — M5432 Sciatica, left side: Secondary | ICD-10-CM | POA: Diagnosis not present

## 2022-07-17 DIAGNOSIS — M9905 Segmental and somatic dysfunction of pelvic region: Secondary | ICD-10-CM | POA: Diagnosis not present

## 2022-07-17 DIAGNOSIS — M9903 Segmental and somatic dysfunction of lumbar region: Secondary | ICD-10-CM | POA: Diagnosis not present

## 2022-07-17 DIAGNOSIS — M5416 Radiculopathy, lumbar region: Secondary | ICD-10-CM | POA: Diagnosis not present

## 2022-09-25 ENCOUNTER — Ambulatory Visit: Payer: Self-pay

## 2022-09-25 ENCOUNTER — Ambulatory Visit (INDEPENDENT_AMBULATORY_CARE_PROVIDER_SITE_OTHER): Payer: PPO | Admitting: Physician Assistant

## 2022-09-25 ENCOUNTER — Encounter: Payer: Self-pay | Admitting: Physician Assistant

## 2022-09-25 VITALS — BP 129/91 | HR 77 | Temp 97.8°F | Ht 73.0 in | Wt 220.2 lb

## 2022-09-25 DIAGNOSIS — I447 Left bundle-branch block, unspecified: Secondary | ICD-10-CM | POA: Diagnosis not present

## 2022-09-25 DIAGNOSIS — R0602 Shortness of breath: Secondary | ICD-10-CM

## 2022-09-25 DIAGNOSIS — I429 Cardiomyopathy, unspecified: Secondary | ICD-10-CM | POA: Insufficient documentation

## 2022-09-25 DIAGNOSIS — I10 Essential (primary) hypertension: Secondary | ICD-10-CM | POA: Diagnosis not present

## 2022-09-25 NOTE — Assessment & Plan Note (Signed)
Vitals normal. May be 2/2 cardiomyopathy given normal heart rate, O2, euvolemic appearance.  Ref to cardiology.  Given strict ED precautions

## 2022-09-25 NOTE — Telephone Encounter (Signed)
  Chief Complaint: occasionally has to take deeper breaths- comes and goes Symptoms: no other sx Frequency: 2 days  Pertinent Negatives: Patient denies dizziness, runny nose, cough, chest pain, fever Disposition: '[]'$ ED /'[]'$ Urgent Care (no appt availability in office) / '[x]'$ Appointment(In office/virtual)/ '[]'$  [place] [facility]/ '[]'$ Home Care/ '[]'$ Refused Recommended Disposition /'[]'$ [place] [facility]/ '[]'$  Follow-up with PCP Additional Notes: Pt asking and concerned about getting a referral to new cardiologist. Pt talking in full sentences- does not need UC- Appt that had been made with Dr Caryn Section is 22 days away. Rescheduled with L. Drubel PA Reason for Disposition  [1] MILD difficulty breathing (e.g., minimal/no SOB at rest, SOB with walking, pulse <100) AND [2] NEW-onset or WORSE than normal  Answer Assessment - Initial Assessment Questions 1. RESPIRATORY STATUS: "Describe your breathing?" (e.g., wheezing, shortness of breath, unable to speak, severe coughing)      Am feels like having SOB deeper occasionally  2. ONSET: "When did this breathing problem begin?"      2 days  3. PATTERN "Does the difficult breathing come and go, or has it been constant since it started?"      Comes and goes 4. SEVERITY: "How bad is your breathing?" (e.g., mild, moderate, severe)    - MILD: No SOB at rest, mild SOB with walking, speaks normally in sentences, can lie down, no retractions, pulse < 100.    - MODERATE: SOB at rest, SOB with minimal exertion and prefers to sit, cannot lie down flat, speaks in phrases, mild retractions, audible wheezing, pulse 100-120.    - SEVERE: Very SOB at rest, speaks in single words, struggling to breathe, sitting hunched forward, retractions, pulse > 120      mild 5. RECURRENT SYMPTOM: "Have you had difficulty breathing before?" If Yes, ask: "When was the last time?" and "What happened that time?"      *No Answer* 6. CARDIAC HISTORY: "Do you have any history of heart disease?"  (e.g., heart attack, angina, bypass surgery, angioplasty)      no 7. LUNG HISTORY: "Do you have any history of lung disease?"  (e.g., pulmonary embolus, asthma, emphysema)     *No Answer* 8. CAUSE: "What do you think is causing the breathing problem?"      unsure 9. OTHER SYMPTOMS: "Do you have any other symptoms? (e.g., dizziness, runny nose, cough, chest pain, fever)     no 10. O2 SATURATION MONITOR:  "Do you use an oxygen saturation monitor (pulse oximeter) at home?" If Yes, ask: "What is your reading (oxygen level) today?" "What is your usual oxygen saturation reading?" (e.g., 95%)       *No Answer* 11. PREGNANCY: "Is there any chance you are pregnant?" "When was your last menstrual period?"       N/a 12. TRAVEL: "Have you traveled out of the country in the last month?" (e.g., travel history, exposures)       *No Answer*  Protocols used: Breathing Difficulty-A-AH

## 2022-09-25 NOTE — Progress Notes (Signed)
Established patient visit   Patient: Derek Jackson   DOB: 1951-02-05   72 y.o. Male  MRN: [id number] Visit Date: 09/25/2022  Today's healthcare provider: Mikey Kirschner, PA-C   Cc. Referral to cardiology  Subjective    HPI  Pt reports occasional SOB where he feels he cannot take a deep breath and get air in. Seems to happen more in the mornings. X 6 weeks-- feels unable to exercise in this time as well. Denies dizziness, syncope, changes in vision, chest pain.   He reports needing a new referral to a new cardiologist has his retired.   Medications: Outpatient Medications Prior to Visit  Medication Sig   dorzolamide-timolol (COSOPT) 22.3-6.8 MG/ML ophthalmic solution 1 drop 2 (two) times daily.   lisinopril (PRINIVIL,ZESTRIL) 20 MG tablet Take 1 tablet by mouth daily.   metoprolol tartrate (LOPRESSOR) 25 MG tablet Take 1 tablet by mouth 2 (two) times daily.   omeprazole (PRILOSEC) 40 MG capsule TAKE 1 CAPSULE BY MOUTH ONCE DAILY   rosuvastatin (CRESTOR) 5 MG tablet Take 5 mg by mouth daily.   spironolactone (ALDACTONE) 25 MG tablet Take 12.5 mg by mouth daily.    tamsulosin (FLOMAX) 0.4 MG CAPS capsule Take 1 capsule (0.4 mg total) by mouth daily.   No facility-administered medications prior to visit.    Review of Systems  Constitutional:  Negative for fatigue and fever.  Respiratory:  Positive for shortness of breath. Negative for cough.   Cardiovascular:  Negative for chest pain, palpitations and leg swelling.  Neurological:  Negative for dizziness and headaches.        Objective    BP (!) 129/91   Pulse 77   Temp 97.8 F (36.6 C)   Ht '6\' 1"'$  (1.854 m)   Wt 220 lb 3.2 oz (99.9 kg)   SpO2 97%   BMI 29.05 kg/m     Physical Exam Constitutional:      General: He is awake.     Appearance: He is well-developed.  HENT:     Head: Normocephalic.  Eyes:     Conjunctiva/sclera: Conjunctivae normal.  Cardiovascular:     Rate and Rhythm: Normal rate and  regular rhythm.     Heart sounds: Normal heart sounds.  Pulmonary:     Effort: Pulmonary effort is normal.     Breath sounds: Normal breath sounds.  Musculoskeletal:     Right lower leg: No edema.     Left lower leg: No edema.  Skin:    General: Skin is warm.  Neurological:     Mental Status: He is alert and oriented to person, place, and time.  Psychiatric:        Attention and Perception: Attention normal.        Mood and Affect: Mood normal.        Speech: Speech normal.        Behavior: Behavior is cooperative.     No results found for any visits on 09/25/22.  Assessment & Plan     Problem List Items Addressed This Visit       Cardiovascular and Mediastinum   Primary hypertension   Relevant Orders   Ambulatory referral to Cardiology   LBBB (left bundle branch block)   Relevant Orders   Ambulatory referral to Cardiology   Cardiomyopathy [facility])    Ref to cardiology      Relevant Orders   Ambulatory referral to Cardiology     Other   Shortness of  breath - Primary    Vitals normal. May be 2/2 cardiomyopathy given normal heart rate, O2, euvolemic appearance.  Ref to cardiology.  Given strict ED precautions      Relevant Orders   Comprehensive Metabolic Panel (CMET)   B Nat Peptide   CBC w/Diff/Platelet     Return if symptoms worsen or fail to improve.      I, Mikey Kirschner, PA-C have reviewed all documentation for this visit. The documentation on  09/25/22  for the exam, diagnosis, procedures, and orders are all accurate and complete.  Mikey Kirschner, PA-C [facility] [address] [place], [place], [address] Office: [phone] Fax: [place]

## 2022-09-25 NOTE — Assessment & Plan Note (Signed)
Ref to cardiology

## 2022-09-26 LAB — CBC WITH DIFFERENTIAL/PLATELET
Basophils Absolute: 0.1 10*3/uL (ref 0.0–0.2)
Basos: 1 %
EOS (ABSOLUTE): 0 10*3/uL (ref 0.0–0.4)
Eos: 0 %
Hematocrit: 44.4 % (ref 37.5–51.0)
Hemoglobin: 15 g/dL (ref 13.0–17.7)
Immature Grans (Abs): 0 10*3/uL (ref 0.0–0.1)
Immature Granulocytes: 0 %
Lymphocytes Absolute: 2.1 10*3/uL (ref 0.7–3.1)
Lymphs: 23 %
MCH: 30.7 pg (ref 26.6–33.0)
MCHC: 33.8 g/dL (ref 31.5–35.7)
MCV: 91 fL (ref 79–97)
Monocytes Absolute: 0.8 10*3/uL (ref 0.1–0.9)
Monocytes: 9 %
Neutrophils Absolute: 6 10*3/uL (ref 1.4–7.0)
Neutrophils: 67 %
Platelets: 352 10*3/uL (ref 150–450)
RBC: 4.89 x10E6/uL (ref 4.14–5.80)
RDW: 12.8 % (ref 11.6–15.4)
WBC: 9.1 10*3/uL (ref 3.4–10.8)

## 2022-09-26 LAB — COMPREHENSIVE METABOLIC PANEL
ALT: 73 IU/L — ABNORMAL HIGH (ref 0–44)
AST: 41 IU/L — ABNORMAL HIGH (ref 0–40)
Albumin/Globulin Ratio: 2.2 (ref 1.2–2.2)
Albumin: 4.9 g/dL — ABNORMAL HIGH (ref 3.8–4.8)
Alkaline Phosphatase: 94 IU/L (ref 44–121)
BUN/Creatinine Ratio: 17 (ref 10–24)
BUN: 15 mg/dL (ref 8–27)
Bilirubin Total: 0.8 mg/dL (ref 0.0–1.2)
CO2: 21 mmol/L (ref 20–29)
Calcium: 10.5 mg/dL — ABNORMAL HIGH (ref 8.6–10.2)
Chloride: 101 mmol/L (ref 96–106)
Creatinine, Ser: 0.87 mg/dL (ref 0.76–1.27)
Globulin, Total: 2.2 g/dL (ref 1.5–4.5)
Glucose: 95 mg/dL (ref 70–99)
Potassium: 4.6 mmol/L (ref 3.5–5.2)
Sodium: 138 mmol/L (ref 134–144)
Total Protein: 7.1 g/dL (ref 6.0–8.5)
eGFR: 92 mL/min/{1.73_m2} (ref >59)

## 2022-09-26 LAB — BRAIN NATRIURETIC PEPTIDE: BNP: 8.7 pg/mL (ref 0.0–100.0)

## 2022-09-27 ENCOUNTER — Other Ambulatory Visit: Payer: Self-pay | Admitting: Physician Assistant

## 2022-09-27 DIAGNOSIS — R748 Abnormal levels of other serum enzymes: Secondary | ICD-10-CM

## 2022-09-27 DIAGNOSIS — K7689 Other specified diseases of liver: Secondary | ICD-10-CM

## 2022-09-30 NOTE — Progress Notes (Unsigned)
Cardiology Office Note  Date:  10/01/2022   ID:  Derek Jackson, DOB 04/11/1951, MRN [id number]  PCP:  Derek Sons, MD   Chief Complaint  Patient presents with   New Patient (Initial Visit)    Ref by Dr. Caryn Jackson to establish care for cardiomyopathy, HTN & LBBB. Patient c/o shortness of breath with or without exertion. Medications reviewed by the patient verbally.     HPI:  [name] is a 72 year old gentleman with past medical history of Cardiomyopathy ejection fraction 45% Essential hypertension Chronic left bundle branch block Sinus bradycardia Hyperlipidemia Hx of colon cancer, colectomy EF 35% in 6/17 EF 45% in 7/23 Who presents for new patient evaluation of his cardiomyopathy  In 2017, SO[place] Echo and cardiac cath at that time  Echocardiogram July 2023, unchanged from 7/82 MILD LV SYSTOLIC DYSFUNCTION (See above) WITH MILD LVH Ef 45%, MILD LVH CONCENTRIC    NORMAL RIGHT VENTRICULAR SYSTOLIC FUNCTION MILDLY ENLARGED    LA MILD VALVULAR REGURGITATION (See above) NO VALVULAR STENOSIS   Prior catheterization, 2015 no significant coronary disease  On further discussion today, he reports having Increased SOB recently, in the morning, better in the day Sedentary, quit walking, weight trending higher Seen by PMD: elevated LFTs AST 41, ALT 73   MRI liver 8/23 Stable hepatic cysts, fatty liver noted  EKG personally reviewed by myself on todays visit Normal sinus rhythm rate 78 bpm left bundle branch block  PMH:   has a past medical history of BPH (benign prostatic hypertrophy), COVID-19 (04/15/2019), Esophagitis, reflux, Glaucoma, History of 2019 novel coronavirus disease (COVID-19) (04/20/2019), History of kidney stones, History of shingles (04/27/2015), Rosacea, and Systolic CHF ([place]) (9562).  PSH:    Past Surgical History:  Procedure Laterality Date   Abdominal wall mass excision  2010   Derek Jackson   CARDIAC CATHETERIZATION   07/2014   No blockages. per patient one side of heart was not functioning well, CHF-Derek Jackson   CHOLECYSTECTOMY  2005   Lapraroscopic   COLON SURGERY     COLONOSCOPY  [id number]   COLONOSCOPY WITH PROPOFOL N/A 04/16/2017   Procedure: COLONOSCOPY WITH PROPOFOL;  Surgeon: Derek Bellow, MD;  [facility] END[facility]COPY;  Service: Endoscopy;  Laterality: N/A;   COLONOSCOPY WITH PROPOFOL N/A 04/24/2022   Procedure: COLONOSCOPY WITH PROPOFOL;  Surgeon: Derek Bellow, MD;  Location: [facility] ENDOSCOPY;  Service: Endoscopy;  Laterality: N/A;   CT Scan of Abdomen  12/05/2008   Soft Tissue mass 2.42cm x 3.98 cm of Abdominal wall lateral to umbilicus, pathology shows fibromatosis. Multiple large liver cysts up to 9cm x 9cm   ESOPHAGOGASTRODUODENOSCOPY (EGD) WITH PROPOFOL N/A 04/24/2022   Procedure: ESOPHAGOGASTRODUODENOSCOPY (EGD) WITH PROPOFOL;  Surgeon: Derek Bellow, MD;  Location: [facility] ENDOSCOPY;  Service: Endoscopy;  Laterality: N/A;   KNEE SURGERY  1994   Arthroscopy. Surgery by Derek Jackson   RIGHT COLECTOMY  2006   Derek Jackson   TONSILLECTOMY     TONSILLECTOMY AND ADENOIDECTOMY  1960's   UPPER GI ENDOSCOPY  2010    Current Outpatient Medications  Medication Sig Dispense Refill   dorzolamide-timolol (COSOPT) 22.3-6.8 MG/ML ophthalmic solution 1 drop 2 (two) times daily.     lisinopril (PRINIVIL,ZESTRIL) 20 MG tablet Take 1 tablet by mouth daily.     metoprolol tartrate (LOPRESSOR) 25 MG tablet Take 1 tablet by mouth 2 (two) times daily.     Multiple Vitamins-Minerals (MENS 50+ MULTI VITAMIN/MIN PO) Take by mouth daily.  omeprazole (PRILOSEC) 40 MG capsule TAKE 1 CAPSULE BY MOUTH ONCE DAILY 90 capsule 4   rosuvastatin (CRESTOR) 5 MG tablet Take 5 mg by mouth daily.     spironolactone (ALDACTONE) 25 MG tablet Take 12.5 mg by mouth daily.      tamsulosin (FLOMAX) 0.4 MG CAPS capsule Take 1 capsule (0.4 mg total) by mouth daily. 90 capsule 2   No current facility-administered  medications for this visit.    Allergies:   Patient has no known allergies.   Social History:  The patient  reports that he quit smoking about 32 years ago. His smoking use included cigarettes. He has a 8.00 pack-year smoking history. He has never used smokeless tobacco. He reports current alcohol use. He reports that he does not use drugs.   Family History:   family history includes Aneurysm in his brother; CAD in his father; Colon polyps in his father; Congestive Heart Failure in his mother; Dementia in his father; Diabetes in his father and paternal grandmother; Heart attack in his maternal grandfather and paternal grandfather; Heart disease in his brother; Stroke in his father.    Review of Systems: Review of Systems  Constitutional: Negative.   HENT: Negative.    Respiratory:  Positive for shortness of breath.   Cardiovascular: Negative.   Gastrointestinal: Negative.   Musculoskeletal: Negative.   Neurological: Negative.   Psychiatric/Behavioral: Negative.    All other systems reviewed and are negative.   PHYSICAL EXAM: VS:  BP 132/83 (BP Location: Right Arm, Patient Position: Sitting, Cuff Size: Normal)   Pulse 78   Ht '6\' 1"'$  (1.854 m)   Wt 223 lb (101.2 kg)   SpO2 98%   BMI 29.42 kg/m  , BMI Body mass index is 29.42 kg/m. Constitutional:  oriented to person, place, and time. No distress.  HENT:  Head: Grossly normal Eyes:  no discharge. No scleral icterus.  Neck: No JVD, no carotid bruits  Cardiovascular: Regular rate and rhythm, no murmurs appreciated Pulmonary/Chest: Clear to auscultation bilaterally, no wheezes or rails Abdominal: Soft.  no distension.  no tenderness.  Musculoskeletal: Normal range of motion Neurological:  normal muscle tone. Coordination normal. No atrophy Skin: Skin warm and dry Psychiatric: normal affect, pleasant  Recent Labs: 09/25/2022: ALT 73; BNP 8.7; BUN 15; Creatinine, Ser 0.87; Hemoglobin 15.0; Platelets 352; Potassium 4.6; Sodium 138     Lipid Panel Lab Results  Component Value Date   CHOL 163 04/22/2022   HDL 48 04/22/2022   LDLCALC 82 04/22/2022   TRIG 199 (H) 04/22/2022    Wt Readings from Last 3 Encounters:  10/01/22 223 lb (101.2 kg)  09/25/22 220 lb 3.2 oz (99.9 kg)  04/24/22 211 lb 6.7 oz (95.9 kg)     ASSESSMENT AND PLAN:  Problem List Items Addressed This Visit       Cardiology Problems   Cardiomyopathy ([place]) - Primary   Systolic CHF ([place])   Primary hypertension   LBBB (left bundle branch block)   Hyperlipemia, mixed     Other   Prediabetes   Cardiomyopathy Initial ejection fraction 30 to 35% up to 45% on last several echocardiograms Prior ischemic workup negative per the notes Shortness of breath in the morning when first getting up, no significant symptoms through the rest of the day Appears euvolemic Weight up 13 pounds from August 2023 Recommend calorie restriction, restart walking program If shortness of breath symptoms get worse could repeat echocardiogram  Chronic left bundle branch block  Essential hypertension Blood  pressure is well controlled on today's visit. No changes made to the medications.  Chronic systolic CHF Appears euvolemic, not on diuretics On metoprolol tartrate, spironolactone, lisinopril In follow-up consider changing metoprolol to tartrate to metoprolol succinate If symptoms of shortness of breath persist could consider adding Farxiga/Jardiance    Total encounter time more than 60 minutes  Greater than 50% was spent in counseling and coordination of care with the patient    Signed, Esmond Plants, M.D., Ph.D. [place], [place]

## 2022-10-01 ENCOUNTER — Encounter: Payer: Self-pay | Admitting: Cardiovascular Disease

## 2022-10-01 ENCOUNTER — Ambulatory Visit: Payer: PPO | Attending: Cardiovascular Disease | Admitting: Cardiovascular Disease

## 2022-10-01 VITALS — BP 132/83 | HR 78 | Ht 73.0 in | Wt 223.0 lb

## 2022-10-01 DIAGNOSIS — R7303 Prediabetes: Secondary | ICD-10-CM | POA: Diagnosis not present

## 2022-10-01 DIAGNOSIS — I10 Essential (primary) hypertension: Secondary | ICD-10-CM | POA: Diagnosis not present

## 2022-10-01 DIAGNOSIS — I420 Dilated cardiomyopathy: Secondary | ICD-10-CM

## 2022-10-01 DIAGNOSIS — I5022 Chronic systolic (congestive) heart failure: Secondary | ICD-10-CM | POA: Diagnosis not present

## 2022-10-01 DIAGNOSIS — I447 Left bundle-branch block, unspecified: Secondary | ICD-10-CM

## 2022-10-01 DIAGNOSIS — E782 Mixed hyperlipidemia: Secondary | ICD-10-CM

## 2022-10-01 MED ORDER — ROSUVASTATIN CALCIUM 5 MG PO TABS: 5.0000 mg | ORAL_TABLET | Freq: Every day | ORAL | 1 refills | Status: DC

## 2022-10-01 MED ORDER — METOPROLOL TARTRATE 25 MG PO TABS: 25.0000 mg | ORAL_TABLET | Freq: Two times a day (BID) | ORAL | 1 refills | Status: DC

## 2022-10-01 MED ORDER — LISINOPRIL 20 MG PO TABS: 20.0000 mg | ORAL_TABLET | Freq: Every day | ORAL | 1 refills | Status: DC

## 2022-10-01 NOTE — Patient Instructions (Addendum)
Medication Instructions:  No changes  If you need a refill on your cardiac medications before your next appointment, please call your pharmacy.   Lab work: No new labs needed  Testing/Procedures: No new testing needed  Follow-Up: At [facility], you and your health needs are our priority.  As part of our continuing mission to provide you with exceptional heart care, we have created designated Provider Care Teams.  These Care Teams include your primary Cardiologist (physician) and Advanced Practice Providers (APPs -  Physician Assistants and Nurse Practitioners) who all work together to provide you with the care you need, when you need it.  You will need a follow up appointment in 6 months  Providers on your designated Care Team:   Murray Hodgkins, NP Christell Faith, PA Cadence Kathlen Mody, [place]  COVID-19 Vaccine Information can be found at: ShippingScam.co.uk For questions related to vaccine distribution or appointments, please email [email or web address] or call [phone].

## 2022-10-16 ENCOUNTER — Ambulatory Visit: Payer: PPO | Admitting: Family Medicine

## 2022-11-04 ENCOUNTER — Telehealth: Payer: Self-pay | Admitting: Cardiovascular Disease

## 2022-11-04 NOTE — Telephone Encounter (Signed)
Please advise if ok to refill medication under Dr. Rockey Situ as the prescribing physician. Medication is currently listed under historical provider. Thank you.

## 2022-11-04 NOTE — Telephone Encounter (Signed)
*  STAT* If patient is at the pharmacy, call can be transferred to refill team.   1. Which medications need to be refilled? (please list name of each medication and dose if known) spironolactone (ALDACTONE) 25 MG tablet   2. Which pharmacy/location (including street and city if local pharmacy) is medication to be sent to? [place], [place]   3. Do they need a 30 day or 90 day supply? [place]

## 2022-11-05 ENCOUNTER — Other Ambulatory Visit: Payer: Self-pay | Admitting: Cardiovascular Disease

## 2022-11-05 MED ORDER — SPIRONOLACTONE 25 MG PO TABS: 12.5000 mg | ORAL_TABLET | Freq: Every day | ORAL | 1 refills | Status: DC

## 2022-11-05 NOTE — Telephone Encounter (Signed)
Requested Prescriptions   Signed Prescriptions Disp Refills   spironolactone (ALDACTONE) 25 MG tablet 45 tablet 1    Sig: Take 0.5 tablets (12.5 mg total) by mouth daily.    Authorizing Provider: Minna Merritts    Ordering User: Raelene Bott,  L

## 2022-11-05 NOTE — Telephone Encounter (Signed)
Pt states that he is out of this medication, and his first missed dose will be on 3/13. Pt states that he called the pharmacy to check on this medication, but the pharmacy hasn't received it. Please advise.

## 2022-11-20 DIAGNOSIS — L918 Other hypertrophic disorders of the skin: Secondary | ICD-10-CM | POA: Diagnosis not present

## 2022-11-20 DIAGNOSIS — D2272 Melanocytic nevi of left lower limb, including hip: Secondary | ICD-10-CM | POA: Diagnosis not present

## 2022-11-20 DIAGNOSIS — D2271 Melanocytic nevi of right lower limb, including hip: Secondary | ICD-10-CM | POA: Diagnosis not present

## 2022-11-20 DIAGNOSIS — D225 Melanocytic nevi of trunk: Secondary | ICD-10-CM | POA: Diagnosis not present

## 2022-11-20 DIAGNOSIS — D2261 Melanocytic nevi of right upper limb, including shoulder: Secondary | ICD-10-CM | POA: Diagnosis not present

## 2022-11-20 DIAGNOSIS — L565 Disseminated superficial actinic porokeratosis (DSAP): Secondary | ICD-10-CM | POA: Diagnosis not present

## 2022-11-20 DIAGNOSIS — L538 Other specified erythematous conditions: Secondary | ICD-10-CM | POA: Diagnosis not present

## 2022-11-20 DIAGNOSIS — D2262 Melanocytic nevi of left upper limb, including shoulder: Secondary | ICD-10-CM | POA: Diagnosis not present

## 2022-11-20 DIAGNOSIS — L821 Other seborrheic keratosis: Secondary | ICD-10-CM | POA: Diagnosis not present

## 2022-11-20 DIAGNOSIS — L814 Other melanin hyperpigmentation: Secondary | ICD-10-CM | POA: Diagnosis not present

## 2022-12-28 ENCOUNTER — Other Ambulatory Visit: Payer: Self-pay | Admitting: Family Medicine

## 2022-12-28 DIAGNOSIS — R3911 Hesitancy of micturition: Secondary | ICD-10-CM

## 2022-12-31 DIAGNOSIS — L853 Xerosis cutis: Secondary | ICD-10-CM | POA: Diagnosis not present

## 2022-12-31 DIAGNOSIS — L565 Disseminated superficial actinic porokeratosis (DSAP): Secondary | ICD-10-CM | POA: Diagnosis not present

## 2023-01-06 DIAGNOSIS — H401132 Primary open-angle glaucoma, bilateral, moderate stage: Secondary | ICD-10-CM | POA: Diagnosis not present

## 2023-01-15 DIAGNOSIS — H401132 Primary open-angle glaucoma, bilateral, moderate stage: Secondary | ICD-10-CM | POA: Diagnosis not present

## 2023-01-15 DIAGNOSIS — H2513 Age-related nuclear cataract, bilateral: Secondary | ICD-10-CM | POA: Diagnosis not present

## 2023-03-20 ENCOUNTER — Other Ambulatory Visit: Payer: PPO

## 2023-03-21 ENCOUNTER — Ambulatory Visit: Payer: PPO | Admitting: Urology

## 2023-03-31 ENCOUNTER — Other Ambulatory Visit: Payer: Self-pay

## 2023-03-31 ENCOUNTER — Other Ambulatory Visit: Payer: PPO

## 2023-03-31 DIAGNOSIS — N401 Enlarged prostate with lower urinary tract symptoms: Secondary | ICD-10-CM

## 2023-04-02 ENCOUNTER — Ambulatory Visit: Payer: PPO | Admitting: Urology

## 2023-04-02 ENCOUNTER — Encounter: Payer: Self-pay | Admitting: Urology

## 2023-04-02 VITALS — BP 129/78 | HR 68 | Ht 73.0 in | Wt 218.0 lb

## 2023-04-02 DIAGNOSIS — N401 Enlarged prostate with lower urinary tract symptoms: Secondary | ICD-10-CM

## 2023-04-02 MED ORDER — SILODOSIN 8 MG PO CAPS
8.0000 mg | ORAL_CAPSULE | Freq: Every day | ORAL | 0 refills | Status: DC
Start: 2023-04-02 — End: 2023-04-07

## 2023-04-02 NOTE — Progress Notes (Signed)
I, Maysun Anabel Bene, acting as a scribe for Riki Altes, MD., have documented all relevant documentation on the behalf of Riki Altes, MD, as directed by Riki Altes, MD while in the presence of Riki Altes, MD.  04/02/2023 9:25 AM   Georg Ruddle 04/17/1951 [id number]  Referring provider: Malva Limes, MD [address] [place],  [place] [address]  Chief Complaint  Patient presents with   Elevated PSA   Urologic history: 1.  Elevated PSA Prostate biopsy 03/2018; PSA 4.7; volume 53 cc; benign pathology   2.  BPH with lower urinary tract symptoms On tamsulosin  HPI: Derek Jackson is a 72 y.o. male presents for annual follow-up.   Since last year's visit he has noted some worsening lower urinary tract symptoms including urinary hesitancy, decreased force and caliber of his urinary stream.  IPSS today 17/35 Denies dysuria, gross hematuria.  No flank, abdominal, or pelvic pain.  PSA 03/31/23 stable at 3.2  PSA trend  Prostate Specific Ag, Serum  Latest Ref Rng 0.0 - 4.0 ng/mL  05/20/2018 3.7   11/09/2018 4.3 (H)   02/07/2020 3.4   02/19/2021 3.1   03/11/2022 3.3   03/31/2023 3.2     PMH: Past Medical History:  Diagnosis Date   BPH (benign prostatic hypertrophy)    COVID-19 04/15/2019   Esophagitis, reflux    Glaucoma    History of 2019 novel coronavirus disease (COVID-19) 04/20/2019   History of kidney stones    History of shingles 04/27/2015   Rosacea    Systolic CHF ([facility]) 2016    Surgical History: Past Surgical History:  Procedure Laterality Date   Abdominal wall mass excision  2010   Dr. Lemar Livings   CARDIAC CATHETERIZATION  07/2014   No blockages. per patient one side of heart was not functioning well, CHF-Dr. Gwen Pounds   CHOLECYSTECTOMY  2005   Lapraroscopic   COLON SURGERY     COLONOSCOPY  [id number]   COLONOSCOPY WITH PROPOFOL N/A 04/16/2017   Procedure: COLONOSCOPY WITH PROPOFOL;  Surgeon: Earline Mayotte, MD;  Location:  [facility] ENDOSCOPY;  Service: Endoscopy;  Laterality: N/A;   COLONOSCOPY WITH PROPOFOL N/A 04/24/2022   Procedure: COLONOSCOPY WITH PROPOFOL;  Surgeon: Earline Mayotte, MD;  Location: [facility] ENDOSCOPY;  Service: Endoscopy;  Laterality: N/A;   CT Scan of Abdomen  12/05/2008   Soft Tissue mass 2.42cm x 3.98 cm of Abdominal wall lateral to umbilicus, pathology shows fibromatosis. Multiple large liver cysts up to 9cm x 9cm   ESOPHAGOGASTRODUODENOSCOPY (EGD) WITH PROPOFOL N/A 04/24/2022   Procedure: ESOPHAGOGASTRODUODENOSCOPY (EGD) WITH PROPOFOL;  Surgeon: Earline Mayotte, MD;  Location: [facility] ENDOSCOPY;  Service: Endoscopy;  Laterality: N/A;   KNEE SURGERY  1994   Arthroscopy. Surgery by Dr. Ernest Pine   RIGHT COLECTOMY  2006   Dr.Byrnett   TONSILLECTOMY     TONSILLECTOMY AND ADENOIDECTOMY  1960's   UPPER GI ENDOSCOPY  2010    Home Medications:  Allergies as of 04/02/2023   No Known Allergies      Medication List        Accurate as of April 02, 2023  9:25 AM. If you have any questions, ask your nurse or doctor.          dorzolamide-timolol 2-0.5 % ophthalmic solution Commonly known as: COSOPT 1 drop 2 (two) times daily.   lisinopril 20 MG tablet Commonly known as: ZESTRIL Take 1 tablet (20 mg total) by mouth daily.  MENS 50+ MULTI VITAMIN/MIN PO Take by mouth daily.   metoprolol tartrate 25 MG tablet Commonly known as: LOPRESSOR Take 1 tablet (25 mg total) by mouth 2 (two) times daily.   omeprazole 40 MG capsule Commonly known as: PRILOSEC TAKE 1 CAPSULE BY MOUTH ONCE DAILY   rosuvastatin 5 MG tablet Commonly known as: CRESTOR Take 1 tablet (5 mg total) by mouth daily.   silodosin 8 MG Caps capsule Commonly known as: RAPAFLO Take 1 capsule (8 mg total) by mouth daily with breakfast. Started by: Riki Altes   spironolactone 25 MG tablet Commonly known as: ALDACTONE Take 0.5 tablets (12.5 mg total) by mouth daily.   tamsulosin 0.4 MG Caps capsule Commonly known  as: FLOMAX TAKE ONE CAPSULE BY MOUTH ONCE DAILY        Allergies: No Known Allergies  Family History: Family History  Problem Relation Age of Onset   Colon polyps Father    Diabetes Father    CAD Father        had CABG   Stroke Father    Dementia Father    Congestive Heart Failure Mother    Heart attack Maternal Grandfather    Diabetes Paternal Grandmother    Heart attack Paternal Grandfather    Heart disease Brother        also had history of VSD   Aneurysm Brother    Prostate cancer Neg Hx    Bladder Cancer Neg Hx    Kidney cancer Neg Hx     Social History:  reports that he quit smoking about 32 years ago. His smoking use included cigarettes. He started smoking about 40 years ago. He has a 8 pack-year smoking history. He has never used smokeless tobacco. He reports current alcohol use. He reports that he does not use drugs.   Physical Exam: BP 129/78   Pulse 68   Ht 6\' 1"  (1.854 m)   Wt 218 lb (98.9 kg)   BMI 28.76 kg/m   Constitutional:  Alert and oriented, No acute distress. HEENT: [place] AT, moist mucus membranes.  Trachea midline, no masses. Cardiovascular: No clubbing, cyanosis, or edema. Respiratory: Normal respiratory effort, no increased work of breathing. GI: Abdomen is soft, nontender, nondistended, no abdominal masses Skin: No rashes, bruises or suspicious lesions. Neurologic: Grossly intact, no focal deficits, moving all 4 extremities. Psychiatric: Normal mood and affect.   Assessment & Plan:    1. BPH with LUTS  Worsening lower urinary tract symptoms since last year's visit.  We discussed management options including titrating tamsulosin to 0.8 mg, trying a prostate-specific alpha blocker, or adding a 5-ARI medication. We discussed the latter class of medications take approximately 6 months to achieve maximum efficacy. He also had questions regarding UroLift which we discussed and he was provided the literature. If interested in pursuing UroLift will  need to schedule cystoscopy and TRUS for volume. He indicated he would think this over.  He has elected a 30-day trial of a prostate-specific alpha blocker. He will hold his tamsulosin and Rx silodosin 8 mg sent to pharmacy. He will call back regarding efficacy.  I have reviewed the above documentation for accuracy and completeness, and I agree with the above.   Riki Altes, MD  [facility] [address] [place], [place] [address] [phone]

## 2023-04-03 ENCOUNTER — Telehealth: Payer: Self-pay | Admitting: Family Medicine

## 2023-04-03 ENCOUNTER — Telehealth: Payer: Self-pay

## 2023-04-03 NOTE — Telephone Encounter (Signed)
Contacted Derek Jackson to schedule their annual wellness visit. Patient declined to schedule AWV at this time.  Thank you,  [facility] Support [facility] Direct dial  [phone]

## 2023-04-03 NOTE — Telephone Encounter (Signed)
Patient called in and states that he is going to continue Flomax secondary to cost of New Medication sent in. He wanted to let you know.

## 2023-04-06 NOTE — Progress Notes (Unsigned)
Shortness cardiology Office Note  Date:  04/07/2023   ID:  Derek Jackson, DOB August 19, 1951, MRN [id number]  PCP:  Malva Limes, MD   Chief Complaint  Patient presents with   Follow-up    6 month follow up. Patient reports occ. SOB when walking. Patient reports a weird flutter feeling at times. Meds reviewed verbally with patient.      HPI:  Derek Jackson is a 72 year old gentleman with past medical history of Former smoker quit 28 Cardiomyopathy ejection fraction 45%, nonischemic Prior catheterization, 2015 no significant coronary disease Essential hypertension Chronic left bundle branch block Sinus bradycardia Hyperlipidemia Hx of colon cancer, colectomy 2 ft EF 35% in 6/17 EF 45% in 7/23 Who presents for follow-up of his cardiomyopathy  Last seen by myself in clinic February 2024  Walks every morning, in general reports feeling well Some SOB on walking hills 20 laps with lawn mower then has to sit to recover Denies any significant change in his breathing  Rare flutter less than a minute, "10 sec" Once a month or less  Taking Crestor every other day, myalgias when taking daily  Was told by primary care to take half dose spironolactone, "number was high " unable to cut so he takes spironolactone every other day   EKG personally reviewed by myself on todays visit EKG Interpretation Date/Time:  Monday April 07 2023 08:07:57 EDT Ventricular Rate:  55 PR Interval:  168 QRS Duration:  166 QT Interval:  470 QTC Calculation: 449 R Axis:   -11  Text Interpretation: Sinus bradycardia Left bundle branch block When compared with ECG of 12-Dec-2003 08:10, Left bundle branch block is now Present Confirmed by Julien Nordmann [phone]) on 04/07/2023 8:13:27 AM   Other past medical history reviewed  In 2017, SO[place] the beach Echo and cardiac cath at that time  Echocardiogram July 2023, unchanged from 4/21 MILD LV SYSTOLIC DYSFUNCTION (See above) WITH MILD  LVH Ef 45%, MILD LVH CONCENTRIC    NORMAL RIGHT VENTRICULAR SYSTOLIC FUNCTION MILDLY ENLARGED    LA MILD VALVULAR REGURGITATION (See above) NO VALVULAR STENOSIS  . Seen by PMD: elevated LFTs AST 41, ALT 73   MRI liver 8/23 Stable hepatic cysts, fatty liver noted   PMH:   has a past medical history of BPH (benign prostatic hypertrophy), COVID-19 (04/15/2019), Esophagitis, reflux, Glaucoma, History of 2019 novel coronavirus disease (COVID-19) (04/20/2019), History of kidney stones, History of shingles (04/27/2015), Rosacea, and Systolic CHF (HCC) (2016).  PSH:    Past Surgical History:  Procedure Laterality Date   Abdominal wall mass excision  2010   Dr. Lemar Livings   CARDIAC CATHETERIZATION  07/2014   No blockages. per patient one side of heart was not functioning well, CHF-Dr. Gwen Pounds   CHOLECYSTECTOMY  2005   Lapraroscopic   COLON SURGERY     COLONOSCOPY  [id number]   COLONOSCOPY WITH PROPOFOL N/A 04/16/2017   Procedure: COLONOSCOPY WITH PROPOFOL;  Surgeon: Earline Mayotte, MD;  Location: [facility] ENDOSCOPY;  Service: Endoscopy;  Laterality: N/A;   COLONOSCOPY WITH PROPOFOL N/A 04/24/2022   Procedure: COLONOSCOPY WITH PROPOFOL;  Surgeon: Earline Mayotte, MD;  Location: [facility] ENDOSCOPY;  Service: Endoscopy;  Laterality: N/A;   CT Scan of Abdomen  12/05/2008   Soft Tissue mass 2.42cm x 3.98 cm of Abdominal wall lateral to umbilicus, pathology shows fibromatosis. Multiple large liver cysts up to 9cm x 9cm   ESOPHAGOGASTRODUODENOSCOPY (EGD) WITH PROPOFOL N/A 04/24/2022   Procedure: ESOPHAGOGASTRODUODENOSCOPY (EGD) WITH PROPOFOL;  Surgeon: Earline Mayotte, MD;  Location: [facility] ENDOSCOPY;  Service: Endoscopy;  Laterality: N/A;   KNEE SURGERY  1994   Arthroscopy. Surgery by Dr. Ernest Pine   RIGHT COLECTOMY  2006   Dr.Byrnett   TONSILLECTOMY     TONSILLECTOMY AND ADENOIDECTOMY  1960's   UPPER GI ENDOSCOPY  2010    Current Outpatient Medications  Medication Sig Dispense Refill    dorzolamide-timolol (COSOPT) 22.3-6.8 MG/ML ophthalmic solution 1 drop 2 (two) times daily.     lisinopril (ZESTRIL) 20 MG tablet Take 1 tablet (20 mg total) by mouth daily. 90 tablet 1   Multiple Vitamins-Minerals (MENS 50+ MULTI VITAMIN/MIN PO) Take by mouth daily.     omeprazole (PRILOSEC) 40 MG capsule TAKE 1 CAPSULE BY MOUTH ONCE DAILY 90 capsule 4   rosuvastatin (CRESTOR) 5 MG tablet Take 1 tablet (5 mg total) by mouth daily. 90 tablet 1   spironolactone (ALDACTONE) 25 MG tablet Take 0.5 tablets (12.5 mg total) by mouth daily. 45 tablet 1   tamsulosin (FLOMAX) 0.4 MG CAPS capsule TAKE ONE CAPSULE BY MOUTH ONCE DAILY 90 capsule 3   No current facility-administered medications for this visit.    Allergies:   Patient has no known allergies.   Social History:  The patient  reports that he quit smoking about 32 years ago. His smoking use included cigarettes. He started smoking about 40 years ago. He has a 8 pack-year smoking history. He has never used smokeless tobacco. He reports current alcohol use. He reports that he does not use drugs.   Family History:   family history includes Aneurysm in his brother; CAD in his father; Colon polyps in his father; Congestive Heart Failure in his mother; Dementia in his father; Diabetes in his father and paternal grandmother; Heart attack in his maternal grandfather and paternal grandfather; Heart disease in his brother; Stroke in his father.    Review of Systems: Review of Systems  Constitutional: Negative.   HENT: Negative.    Respiratory:  Positive for shortness of breath.   Cardiovascular: Negative.   Gastrointestinal: Negative.   Musculoskeletal: Negative.   Neurological: Negative.   Psychiatric/Behavioral: Negative.    All other systems reviewed and are negative.   PHYSICAL EXAM: VS:  BP 132/78 (BP Location: Left Arm, Patient Position: Sitting, Cuff Size: Normal)   Pulse (!) 55   Ht 6\' 1"  (1.854 m)   Wt 222 lb 6.4 oz (100.9 kg)   BMI  29.34 kg/m  , BMI Body mass index is 29.34 kg/m. Constitutional:  oriented to person, place, and time. No distress.  HENT:  Head: Grossly normal Eyes:  no discharge. No scleral icterus.  Neck: No JVD, no carotid bruits  Cardiovascular: Regular rate and rhythm, no murmurs appreciated Pulmonary/Chest: Clear to auscultation bilaterally, no wheezes or rails Abdominal: Soft.  no distension.  no tenderness.  Musculoskeletal: Normal range of motion Neurological:  normal muscle tone. Coordination normal. No atrophy Skin: Skin warm and dry Psychiatric: normal affect, pleasant   Recent Labs: 09/25/2022: ALT 73; BNP 8.7; BUN 15; Creatinine, Ser 0.87; Hemoglobin 15.0; Platelets 352; Potassium 4.6; Sodium 138    Lipid Panel Lab Results  Component Value Date   CHOL 163 04/22/2022   HDL 48 04/22/2022   LDLCALC 82 04/22/2022   TRIG 199 (H) 04/22/2022    Wt Readings from Last 3 Encounters:  04/07/23 222 lb 6.4 oz (100.9 kg)  04/02/23 218 lb (98.9 kg)  10/01/22 223 lb (101.2 kg)  ASSESSMENT AND PLAN:  Problem List Items Addressed This Visit       Cardiology Problems   Cardiomyopathy (HCC) - Primary   Systolic CHF (HCC)   Aortic dilatation (HCC)   LBBB (left bundle branch block)   Relevant Orders   EKG 12-Lead (Completed)   Hyperlipemia, mixed     Other   Prediabetes    Cardiomyopathy Initial ejection fraction 30 to 35% 2018  EF up to 45% on last several echocardiograms Prior ischemic workup negative per the notes including cardiac catheterization Appears euvolemic, chronic mild shortness of breath heavy exertion, stable Reports walking every morning If shortness of breath symptoms get worse could repeat echocardiogram Also trial of Lasix could be used for worsening shortness of breath Will change metoprolol to tartrate to metoprolol succinate 25mg   daily  Chronic left bundle branch block Noted on recent EKGs  Essential hypertension Will change metoprolol to  tartrate to metoprolol succinate given cardiomyopathy  Chronic systolic CHF Appears euvolemic, not on diuretics Taking spironolactone every other day, lisinopril daily Changed to metoprolol succinate Prefers no expensive medication such as Farxiga/Jardiance  Hyperlipidemia History of myalgias on Crestor daily, he is taking this every other day  Fluttering Episodes, suggestion for any worsening symptoms we could do a Zio monitor He will call us if there is a change   Total encounter time more than 30 minutes  Greater than 50% was spent in counseling and coordination of care with the patient    Signed, Dossie Arbour, M.D., Ph.D. [facility] [place], [place] [phone]

## 2023-04-07 ENCOUNTER — Encounter: Payer: Self-pay | Admitting: Cardiovascular Disease

## 2023-04-07 ENCOUNTER — Ambulatory Visit: Payer: PPO | Attending: Cardiovascular Disease | Admitting: Cardiovascular Disease

## 2023-04-07 ENCOUNTER — Telehealth: Payer: Self-pay | Admitting: Cardiovascular Disease

## 2023-04-07 VITALS — BP 132/78 | HR 55 | Ht 73.0 in | Wt 222.4 lb

## 2023-04-07 DIAGNOSIS — I5022 Chronic systolic (congestive) heart failure: Secondary | ICD-10-CM | POA: Diagnosis not present

## 2023-04-07 DIAGNOSIS — I77819 Aortic ectasia, unspecified site: Secondary | ICD-10-CM | POA: Diagnosis not present

## 2023-04-07 DIAGNOSIS — R7303 Prediabetes: Secondary | ICD-10-CM | POA: Diagnosis not present

## 2023-04-07 DIAGNOSIS — E782 Mixed hyperlipidemia: Secondary | ICD-10-CM

## 2023-04-07 DIAGNOSIS — I447 Left bundle-branch block, unspecified: Secondary | ICD-10-CM | POA: Diagnosis not present

## 2023-04-07 DIAGNOSIS — I420 Dilated cardiomyopathy: Secondary | ICD-10-CM | POA: Diagnosis not present

## 2023-04-07 MED ORDER — SPIRONOLACTONE 25 MG PO TABS: 25.0000 mg | ORAL_TABLET | ORAL | 3 refills | Status: DC

## 2023-04-07 MED ORDER — METOPROLOL SUCCINATE ER 50 MG PO TB24: 25.0000 mg | ORAL_TABLET | Freq: Every day | ORAL | 3 refills | Status: DC

## 2023-04-07 MED ORDER — SPIRONOLACTONE 25 MG PO TABS: 12.5000 mg | ORAL_TABLET | ORAL | 3 refills | Status: DC

## 2023-04-07 MED ORDER — METOPROLOL SUCCINATE ER 25 MG PO TB24: 25.0000 mg | ORAL_TABLET | Freq: Every day | ORAL | 3 refills | Status: DC

## 2023-04-07 NOTE — Patient Instructions (Addendum)
Medication Instructions:  Please stop the metoprolol tartrate Start metoprolol succinate 25 mg daily  Spironolactone 25 mg every other day  If you need a refill on your cardiac medications before your next appointment, please call your pharmacy.   Lab work: No new labs needed  Testing/Procedures: No new testing needed  Follow-Up: At [facility], you and your health needs are our priority.  As part of our continuing mission to provide you with exceptional heart care, we have created designated Provider Care Teams.  These Care Teams include your primary Cardiologist (physician) and Advanced Practice Providers (APPs -  Physician Assistants and Nurse Practitioners) who all work together to provide you with the care you need, when you need it.  You will need a follow up appointment in 6 months  Providers on your designated Care Team:   Nicolasa Ducking, NP Eula Listen, PA-C Cadence Fransico Michael, [place]  COVID-19 Vaccine Information can be found at: [email or web address] For questions related to vaccine distribution or appointments, please email [email or web address] or call 9126994987.

## 2023-04-07 NOTE — Telephone Encounter (Signed)
Spoke with Derek Jackson, clarified medications ordered as following:   Spironolactone 25 mg every other day Metoprolol succinate 25 mg daily.  Derek Jackson verbalized understanding, no further questions at this time.

## 2023-04-07 NOTE — Telephone Encounter (Signed)
Pt c/o medication issue:  1. Name of Medication:   spironolactone (ALDACTONE) 25 MG tablet  metoprolol succinate (TOPROL-XL) 25 MG 24 hr tablet   2. How are you currently taking this medication (dosage and times per day)?   3. Are you having a reaction (difficulty breathing--STAT)?   4. What is your medication issue?   Caller stated they need clarification on the instructions for these medication.

## 2023-04-23 ENCOUNTER — Other Ambulatory Visit: Payer: Self-pay | Admitting: Family Medicine

## 2023-04-23 DIAGNOSIS — K2100 Gastro-esophageal reflux disease with esophagitis, without bleeding: Secondary | ICD-10-CM

## 2023-05-06 ENCOUNTER — Encounter: Payer: Self-pay | Admitting: Family Medicine

## 2023-05-06 ENCOUNTER — Ambulatory Visit (INDEPENDENT_AMBULATORY_CARE_PROVIDER_SITE_OTHER): Payer: PPO | Admitting: Family Medicine

## 2023-05-06 VITALS — BP 130/85 | HR 60 | Ht 73.0 in | Wt 224.0 lb

## 2023-05-06 DIAGNOSIS — E782 Mixed hyperlipidemia: Secondary | ICD-10-CM

## 2023-05-06 DIAGNOSIS — Z0001 Encounter for general adult medical examination with abnormal findings: Secondary | ICD-10-CM

## 2023-05-06 DIAGNOSIS — I10 Essential (primary) hypertension: Secondary | ICD-10-CM | POA: Diagnosis not present

## 2023-05-06 DIAGNOSIS — Z0000 Encounter for general adult medical examination without abnormal findings: Secondary | ICD-10-CM

## 2023-05-06 DIAGNOSIS — R7303 Prediabetes: Secondary | ICD-10-CM | POA: Diagnosis not present

## 2023-05-06 DIAGNOSIS — K760 Fatty (change of) liver, not elsewhere classified: Secondary | ICD-10-CM | POA: Diagnosis not present

## 2023-05-06 DIAGNOSIS — M6208 Separation of muscle (nontraumatic), other site: Secondary | ICD-10-CM

## 2023-05-06 DIAGNOSIS — Z23 Encounter for immunization: Secondary | ICD-10-CM

## 2023-05-07 LAB — LIPID PANEL
Chol/HDL Ratio: 3.4 ratio (ref 0.0–5.0)
Cholesterol, Total: 156 mg/dL (ref 100–199)
HDL: 46 mg/dL (ref >39)
LDL Chol Calc (NIH): 77 mg/dL (ref 0–99)
Triglycerides: 195 mg/dL — ABNORMAL HIGH (ref 0–149)
VLDL Cholesterol Cal: 33 mg/dL (ref 5–40)

## 2023-05-07 LAB — COMPREHENSIVE METABOLIC PANEL
ALT: 53 IU/L — ABNORMAL HIGH (ref 0–44)
AST: 41 IU/L — ABNORMAL HIGH (ref 0–40)
Albumin: 4.5 g/dL (ref 3.8–4.8)
Alkaline Phosphatase: 89 IU/L (ref 44–121)
BUN/Creatinine Ratio: 16 (ref 10–24)
BUN: 12 mg/dL (ref 8–27)
Bilirubin Total: 0.7 mg/dL (ref 0.0–1.2)
CO2: 22 mmol/L (ref 20–29)
Calcium: 9.8 mg/dL (ref 8.6–10.2)
Chloride: 100 mmol/L (ref 96–106)
Creatinine, Ser: 0.75 mg/dL — ABNORMAL LOW (ref 0.76–1.27)
Globulin, Total: 1.6 g/dL (ref 1.5–4.5)
Glucose: 120 mg/dL — ABNORMAL HIGH (ref 70–99)
Potassium: 4.9 mmol/L (ref 3.5–5.2)
Sodium: 137 mmol/L (ref 134–144)
Total Protein: 6.1 g/dL (ref 6.0–8.5)
eGFR: 96 mL/min/{1.73_m2} (ref >59)

## 2023-05-07 LAB — CBC
Hematocrit: 43.7 % (ref 37.5–51.0)
Hemoglobin: 14.3 g/dL (ref 13.0–17.7)
MCH: 31.2 pg (ref 26.6–33.0)
MCHC: 32.7 g/dL (ref 31.5–35.7)
MCV: 95 fL (ref 79–97)
Platelets: 303 10*3/uL (ref 150–450)
RBC: 4.58 x10E6/uL (ref 4.14–5.80)
RDW: 13.2 % (ref 11.6–15.4)
WBC: 6.7 10*3/uL (ref 3.4–10.8)

## 2023-05-07 LAB — HEMOGLOBIN A1C
Est. average glucose Bld gHb Est-mCnc: 126 mg/dL
Hgb A1c MFr Bld: 6 % — ABNORMAL HIGH (ref 4.8–5.6)

## 2023-05-07 LAB — MAGNESIUM: Magnesium: 1.7 mg/dL (ref 1.6–2.3)

## 2023-05-12 DIAGNOSIS — M6208 Separation of muscle (nontraumatic), other site: Secondary | ICD-10-CM | POA: Insufficient documentation

## 2023-05-12 NOTE — Progress Notes (Unsigned)
Annual Wellness Visit     Patient: Derek Jackson, Male    DOB: Jul 27, 1951, 72 y.o.   MRN: [id number] Visit Date: 05/06/2023  Today's Provider: Mila Merry, MD    Subjective    Derek Jackson is a 72 y.o. male who presents today for his Annual Wellness Visit.   Medications: Outpatient Medications Prior to Visit  Medication Sig   dorzolamide-timolol (COSOPT) 22.3-6.8 MG/ML ophthalmic solution 1 drop 2 (two) times daily.   lisinopril (ZESTRIL) 20 MG tablet Take 1 tablet (20 mg total) by mouth daily.   metoprolol succinate (TOPROL-XL) 25 MG 24 hr tablet Take 1 tablet (25 mg total) by mouth daily. Take with or immediately following a meal.   Multiple Vitamins-Minerals (MENS 50+ MULTI VITAMIN/MIN PO) Take by mouth daily.   omeprazole (PRILOSEC) 40 MG capsule TAKE ONE CAPSULE BY MOUTH ONCE DAILY   OVER THE COUNTER MEDICATION 1 capsule daily. Take one capsule by mouth daily   rosuvastatin (CRESTOR) 5 MG tablet Take 1 tablet (5 mg total) by mouth daily.   spironolactone (ALDACTONE) 25 MG tablet Take 1 tablet (25 mg total) by mouth every other day.   tamsulosin (FLOMAX) 0.4 MG CAPS capsule TAKE ONE CAPSULE BY MOUTH ONCE DAILY   No facility-administered medications prior to visit.    No Known Allergies  Patient Care Team: Malva Limes, MD as PCP - General (Family Medicine) Lemar Livings, Merrily Pew, MD (General Surgery) Riki Altes, MD as Consulting Physician (Urology) Lamar Blinks, MD as Consulting Physician (Cardiology) Pa, [facility]) Elliot Cousin, MD (Inactive) as Consulting Physician (Ophthalmology) Debbrah Alar, MD (Dermatology)    Objective     Most recent functional status assessment:     No data to display         Most recent fall risk assessment:    09/25/2022    1:10 PM  Fall Risk   Falls in the past year? 0  Risk for fall due to : No Fall Risks  Follow up Falls evaluation completed    Most recent depression  screenings:    04/22/2022    2:15 PM 01/31/2022    2:38 PM  PHQ 2/9 Scores  PHQ - 2 Score 1 0  PHQ- 9 Score 2 0   Most recent cognitive screening:    01/31/2022    2:43 PM  6CIT Screen  What Year? 0 points  What month? 0 points  What time? 0 points  Count back from 20 0 points  Months in reverse 0 points  Repeat phrase 0 points  Total Score 0 points   Most recent Audit-C alcohol use screening    01/31/2022    2:37 PM  Alcohol Use Disorder Test (AUDIT)  1. How often do you have a drink containing alcohol? 1  2. How many drinks containing alcohol do you have on a typical day when you are drinking? 0  3. How often do you have six or more drinks on one occasion? 0  AUDIT-C Score 1   A score of 3 or more in women, and 4 or more in men indicates increased risk for alcohol abuse, EXCEPT if all of the points are from question 1     Assessment & Plan     Annual wellness visit done today including the all of the following: Reviewed patient's Family Medical History Reviewed and updated list of patient's medical providers Assessment of cognitive impairment was done Assessed patient's functional ability  Established a written schedule for health screening services Health Risk Assessent Completed and Reviewed  Exercise Activities and Dietary recommendations  Goals      DIET - EAT MORE FRUITS AND VEGETABLES     DIET - INCREASE WATER INTAKE     Recommend increasing water intake to 4 glasses a day.          Immunization History  Administered Date(s) Administered   Fluad Quad(high Dose 65+) 05/06/2023   H1N1 07/13/2008   Influenza, High Dose Seasonal PF 06/11/2019, 05/15/2020   Influenza-Unspecified 06/04/2018, 06/05/2022   PFIZER(Purple Top)SARS-COV-2 Vaccination 10/11/2019, 11/01/2019, 06/12/2020   Pneumococcal Conjugate-13 12/18/2015   Pneumococcal Polysaccharide-23 12/23/2016   Td 03/21/1999, 11/25/2003   Zoster, Live 10/22/2010    Health Maintenance  Topic Date Due    Zoster Vaccines- Shingrix (1 of 2) 05/26/2000   DTaP/Tdap/Td (3 - Tdap) 11/24/2013   COVID-19 Vaccine (4 - 2023-24 season) 04/27/2023   Medicare Annual Wellness (AWV)  05/05/2024   Colonoscopy  04/24/2025   Pneumonia Vaccine 61+ Years old  Completed   INFLUENZA VACCINE  Completed   Hepatitis C Screening  Completed   HPV VACCINES  Aged Out     Discussed health benefits of physical activity, and encouraged him to engage in regular exercise appropriate for his age and condition.          Mila Merry, MD  [facility] [phone] (phone) [phone] (fax)  [facility]

## 2023-05-12 NOTE — Progress Notes (Unsigned)
Complete physical exam   Patient: Derek Jackson   DOB: Sep 15, 1950   72 y.o. Male  MRN: [id number] Visit Date: 05/06/2023  Today's healthcare provider: Mila Merry, MD   Chief Complaint  Patient presents with   Annual Exam    Hernia, Gi Issues that patient would like to discuss, has been taking fiber    Hypertension   Hyperlipidemia   Subjective    Discussed the use of AI scribe software for clinical note transcription with the patient, who gave verbal consent to proceed.  History of Present Illness   The patient, a 72 year old present for routine physical. He has history of dilated cardiomyopathy and CHF recently had a check-up with his cardiologist, Dr. Mariah Milling, who increased spironolactone and changed from metoprolol tartate 25 bid to metoprolol succinate 25mg  QD. He also saw his urologist, Dr. Lorin Picket, who reported a stable PSA level between three and four.  The patient has a bulge in his abdomen, which he's had since his colon surgery. Tt is not associated with any pain. He also mentions a recent MRI of his liver due to a concern raised by a previous provider about a potential liver issue. The MRI showed a stable cyst and fatty infiltration of liver.   He is currently taking Flomax for his urological issues, but has found fiber capsules to be more beneficial, particularly for his bowel movements. He also reports a decrease in nocturnal urination since starting the fiber capsules, from four times a night to about once a month.  The patient maintains an active lifestyle, walking about two miles daily. He has a history of glaucoma and is on medication for it. He also expresses interest in receiving a COVID-19 booster shot, pending insurance coverage.       Past Medical History:  Diagnosis Date   BPH (benign prostatic hypertrophy)    COVID-19 04/15/2019   Esophagitis, reflux    Glaucoma    History of 2019 novel coronavirus disease (COVID-19) 04/20/2019   History of kidney  stones    History of shingles 04/27/2015   Rosacea    Systolic CHF (HCC) 2016   Past Surgical History:  Procedure Laterality Date   Abdominal wall mass excision  2010   Dr. Lemar Livings   CARDIAC CATHETERIZATION  07/2014   No blockages. per patient one side of heart was not functioning well, CHF-Dr. Gwen Pounds   CHOLECYSTECTOMY  2005   Lapraroscopic   COLON SURGERY     COLONOSCOPY  [id number]   COLONOSCOPY WITH PROPOFOL N/A 04/16/2017   Procedure: COLONOSCOPY WITH PROPOFOL;  Surgeon: Earline Mayotte, MD;  Location: [facility] ENDOSCOPY;  Service: Endoscopy;  Laterality: N/A;   COLONOSCOPY WITH PROPOFOL N/A 04/24/2022   Procedure: COLONOSCOPY WITH PROPOFOL;  Surgeon: Earline Mayotte, MD;  Location: [facility] ENDOSCOPY;  Service: Endoscopy;  Laterality: N/A;   CT Scan of Abdomen  12/05/2008   Soft Tissue mass 2.42cm x 3.98 cm of Abdominal wall lateral to umbilicus, pathology shows fibromatosis. Multiple large liver cysts up to 9cm x 9cm   ESOPHAGOGASTRODUODENOSCOPY (EGD) WITH PROPOFOL N/A 04/24/2022   Procedure: ESOPHAGOGASTRODUODENOSCOPY (EGD) WITH PROPOFOL;  Surgeon: Earline Mayotte, MD;  Location: [facility] ENDOSCOPY;  Service: Endoscopy;  Laterality: N/A;   KNEE SURGERY  1994   Arthroscopy. Surgery by Dr. Ernest Pine   RIGHT COLECTOMY  2006   Dr.Byrnett   TONSILLECTOMY     TONSILLECTOMY AND ADENOIDECTOMY  1960's   UPPER GI ENDOSCOPY  2010   Social History  Socioeconomic History   Marital status: Significant Other    Spouse name: Not on file   Number of children: 3   Years of education: Not on file   Highest education level: Some college, no degree  Occupational History   Occupation: Employed    Comment: Works at [employer]    Comment: part time  Tobacco Use   Smoking status: Former    Current packs/day: 0.00    Average packs/day: 1 pack/day for 8.0 years (8.0 ttl pk-yrs)    Types: Cigarettes    Start date: 04/26/1982    Quit date: 04/26/1990    Years since quitting: 33.0    Smokeless tobacco: Never  Vaping Use   Vaping status: Never Used  Substance and Sexual Activity   Alcohol use: Yes    Alcohol/week: 0.0 standard drinks of alcohol    Comment: 6 pack a month/socially   Drug use: No   Sexual activity: Yes    Birth control/protection: None  Other Topics Concern   Not on file  Social History Narrative   Retired April 2017, still working 3 days a week   Social Determinants of Corporate investment banker Strain: Low Risk  (01/31/2022)   Overall Financial Resource Strain (CARDIA)    Difficulty of Paying Living Expenses: Not hard at all  Food Insecurity: No Food Insecurity (01/31/2022)   Hunger Vital Sign    Worried About Running Out of Food in the Last Year: Never true    Ran Out of Food in the Last Year: Never true  Transportation Needs: No Transportation Needs (01/31/2022)   PRAPARE - Administrator, Civil Service (Medical): No    Lack of Transportation (Non-Medical): No  Physical Activity: Sufficiently Active (01/31/2022)   Exercise Vital Sign    Days of Exercise per Week: 6 days    Minutes of Exercise per Session: 40 min  Stress: No Stress Concern Present (01/31/2022)   [employer] of Occupational Health - Occupational Stress Questionnaire    Feeling of Stress : Not at all  Social Connections: Moderately Isolated (01/31/2022)   Social Connection and Isolation Panel [NHANES]    Frequency of Communication with Friends and Family: Three times a week    Frequency of Social Gatherings with Friends and Family: Once a week    Attends Religious Services: Never    Database administrator or Organizations: Not on file    Attends Banker Meetings: Never    Marital Status: Living with partner  Intimate Partner Violence: Not At Risk (01/31/2022)   Humiliation, Afraid, Rape, and Kick questionnaire    Fear of Current or Ex-Partner: No    Emotionally Abused: No    Physically Abused: No    Sexually Abused: No   Family Status  Relation  Name Status   Father  Deceased at age 53       Cause of death: CVA   Mother  Deceased at age 55       Cause of Death: CHF. was also prediabetic   MGF  Deceased       MI   PGM  Deceased       Cause of death: Complications of Diabetes   PGF  Deceased       MI   Brother  Deceased at age 4       Cause of Death: Heart Disease   Sister  Nature conservation officer  PRE- Diabetes   Daughter  Alive       has had surgery for VSD   MGM  Deceased       OLD age   Neg Hx  (Not Specified)  No partnership data on file   Family History  Problem Relation Age of Onset   Colon polyps Father    Diabetes Father    CAD Father        had CABG   Stroke Father    Dementia Father    Congestive Heart Failure Mother    Heart attack Maternal Grandfather    Diabetes Paternal Grandmother    Heart attack Paternal Grandfather    Heart disease Brother        also had history of VSD   Aneurysm Brother    Prostate cancer Neg Hx    Bladder Cancer Neg Hx    Kidney cancer Neg Hx    No Known Allergies  Patient Care Team: Malva Limes, MD as PCP - General (Family Medicine) Byrnett, Merrily Pew, MD (General Surgery) Riki Altes, MD as Consulting Physician (Urology) Lamar Blinks, MD as Consulting Physician (Cardiology) Pa, [facility] (Optometry) Elliot Cousin, MD (Inactive) as Consulting Physician (Ophthalmology) Debbrah Alar, MD (Dermatology)   Medications: Outpatient Medications Prior to Visit  Medication Sig   dorzolamide-timolol (COSOPT) 22.3-6.8 MG/ML ophthalmic solution 1 drop 2 (two) times daily.   lisinopril (ZESTRIL) 20 MG tablet Take 1 tablet (20 mg total) by mouth daily.   metoprolol succinate (TOPROL-XL) 25 MG 24 hr tablet Take 1 tablet (25 mg total) by mouth daily. Take with or immediately following a meal.   Multiple Vitamins-Minerals (MENS 50+ MULTI VITAMIN/MIN PO) Take by mouth daily.   omeprazole (PRILOSEC) 40 MG capsule TAKE ONE CAPSULE BY MOUTH ONCE DAILY    OVER THE COUNTER MEDICATION 1 capsule daily. Take one capsule by mouth daily   rosuvastatin (CRESTOR) 5 MG tablet Take 1 tablet (5 mg total) by mouth daily.   spironolactone (ALDACTONE) 25 MG tablet Take 1 tablet (25 mg total) by mouth every other day.   tamsulosin (FLOMAX) 0.4 MG CAPS capsule TAKE ONE CAPSULE BY MOUTH ONCE DAILY   No facility-administered medications prior to visit.    Review of Systems {Insert previous labs (optional):23779} {See past labs  Heme  Chem  Endocrine  Serology  Results Review (optional):1}  Objective    BP 130/85 (BP Location: Left Arm, Patient Position: Sitting, Cuff Size: Large)   Pulse 60   Ht 6\' 1"  (1.854 m)   Wt 224 lb (101.6 kg)   SpO2 97%   BMI 29.55 kg/m  {Insert last BP/Wt (optional):23777}{See vitals history (optional):1}  Physical Exam   General Appearance:    Well developed, well nourished male. Alert, cooperative, in no acute distress, appears stated age  Head:    Normocephalic, without obvious abnormality, atraumatic  Eyes:    PERRL, conjunctiva/corneas clear, EOM's intact, fundi    benign, both eyes       Ears:    Normal TM's and external ear canals, both ears  Nose:   Nares normal, septum midline, mucosa normal, no drainage   or sinus tenderness  Throat:   Lips, mucosa, and tongue normal; teeth and gums normal  Neck:   Supple, symmetrical, trachea midline, no adenopathy;       thyroid:  No enlargement/tenderness/nodules; no carotid   bruit or JVD  Back:     Symmetric, no curvature, ROM normal, no CVA  tenderness  Lungs:     Clear to auscultation bilaterally, respirations unlabored  Chest wall:    No tenderness or deformity  Heart:    Normal heart rate. Normal rhythm. No murmurs, rubs, or gallops.  S1 and S2 normal  Abdomen:     Soft, non-tender, bowel sounds active all four quadrants,    no masses, no organomegaly. Diastasis recti noted  Genitalia:    deferred  Rectal:    deferred  Extremities:   All extremities are  intact. No cyanosis or edema  Pulses:   2+ and symmetric all extremities  Skin:   Skin color, texture, turgor normal, no rashes or lesions  Lymph nodes:   Cervical, supraclavicular, and axillary nodes normal  Neurologic:   CNII-XII intact. Normal strength, sensation and reflexes      throughout     Assessment & Plan    Routine Health Maintenance and Physical Exam  Exercise Activities and Dietary recommendations  Goals      DIET - EAT MORE FRUITS AND VEGETABLES     DIET - INCREASE WATER INTAKE     Recommend increasing water intake to 4 glasses a day.          Immunization History  Administered Date(s) Administered   Fluad Quad(high Dose 65+) 05/06/2023   H1N1 07/13/2008   Influenza, High Dose Seasonal PF 06/11/2019, 05/15/2020   Influenza-Unspecified 06/04/2018, 06/05/2022   PFIZER(Purple Top)SARS-COV-2 Vaccination 10/11/2019, 11/01/2019, 06/12/2020   Pneumococcal Conjugate-13 12/18/2015   Pneumococcal Polysaccharide-23 12/23/2016   Td 03/21/1999, 11/25/2003   Zoster, Live 10/22/2010    Health Maintenance  Topic Date Due   Zoster Vaccines- Shingrix (1 of 2) 05/26/2000   DTaP/Tdap/Td (3 - Tdap) 11/24/2013   COVID-19 Vaccine (4 - 2023-24 season) 04/27/2023   Medicare Annual Wellness (AWV)  05/05/2024   Colonoscopy  04/24/2025   Pneumonia Vaccine 41+ Years old  Completed   INFLUENZA VACCINE  Completed   Hepatitis C Screening  Completed   HPV VACCINES  Aged Out    Discussed health benefits of physical activity, and encouraged him to engage in regular exercise appropriate for his age and condition.     Annual complete physical  -Order routine blood work including cholesterol and blood sugar levels.   Abdominal Wall Diastasis Noted bulge in the abdominal wall, likely due to diastasis from previous colon surgery. No pain reported. -No intervention required as it is not causing discomfort or functional impairment.  Fatty Liver Discussed incidental finding of  fatty liver on previous MRI. No recent liver function tests available. -Order liver function tests. -Advise to limit intake of sweets and sugars to reduce hepatic fat deposition.  Benign Prostatic Hyperplasia Reports improvement in nocturia with fiber capsules. Previously tried a new medication but found it too expensive, so continued Flomax. -Continue Flomax and fiber capsules as they seem to be effective.   Hyperlipidemia Aortic atherosclerosis on imaging studies - continue rosuvastatin   COVID-19 Vaccination Last vaccination was approximately two years ago. Patient has a history of heart issues. -Advise to get COVID-19 booster shot, especially given his cardiac history. Patient to check with his pharmacy regarding availability and insurance coverage.  Prediabetes -Hgba1c         Mila Merry, MD  [facility] [phone] (phone) [phone] (fax)  [facility]

## 2023-06-05 ENCOUNTER — Telehealth: Payer: Self-pay | Admitting: Cardiovascular Disease

## 2023-06-05 ENCOUNTER — Telehealth: Payer: Self-pay | Admitting: Urology

## 2023-06-05 NOTE — Telephone Encounter (Signed)
Pt called to let us know he is changing from [place] [facility] [address] [facility]not [facility] inside [employer])

## 2023-06-05 NOTE — Telephone Encounter (Signed)
New Message:     Patient called and wanted you to know that he has changed pharmacy. [facility] [address] [place]

## 2023-06-05 NOTE — Telephone Encounter (Signed)
Noted-this has already been changed

## 2023-07-21 DIAGNOSIS — H11153 Pinguecula, bilateral: Secondary | ICD-10-CM | POA: Diagnosis not present

## 2023-07-21 DIAGNOSIS — H401132 Primary open-angle glaucoma, bilateral, moderate stage: Secondary | ICD-10-CM | POA: Diagnosis not present

## 2023-07-21 DIAGNOSIS — H2513 Age-related nuclear cataract, bilateral: Secondary | ICD-10-CM | POA: Diagnosis not present

## 2023-08-08 ENCOUNTER — Other Ambulatory Visit: Payer: Self-pay | Admitting: Cardiovascular Disease

## 2023-09-12 DIAGNOSIS — H6063 Unspecified chronic otitis externa, bilateral: Secondary | ICD-10-CM | POA: Diagnosis not present

## 2023-09-12 DIAGNOSIS — H903 Sensorineural hearing loss, bilateral: Secondary | ICD-10-CM | POA: Diagnosis not present

## 2023-09-26 ENCOUNTER — Other Ambulatory Visit: Payer: Self-pay | Admitting: Cardiovascular Disease

## 2023-09-26 ENCOUNTER — Telehealth: Payer: Self-pay | Admitting: Cardiovascular Disease

## 2023-09-26 MED ORDER — ROSUVASTATIN CALCIUM 5 MG PO TABS: 5.0000 mg | ORAL_TABLET | Freq: Every day | ORAL | 2 refills | Status: DC

## 2023-09-26 NOTE — Telephone Encounter (Signed)
*  STAT* If patient is at the pharmacy, call can be transferred to refill team.   1. Which medications need to be refilled? (please list name of each medication and dose if known)   rosuvastatin (CRESTOR) 5 MG tablet    2. Which pharmacy/location (including street and city if local pharmacy) is medication to be sent to?  [id number] [address], [place] [phone]    3. Do they need a 30 day or 90 day supply? 90

## 2023-09-26 NOTE — Telephone Encounter (Signed)
 Pt's medication was sent to pt's pharmacy as requested. Confirmation received.

## 2023-10-04 NOTE — Progress Notes (Signed)
 Cardiology Clinic Note   Date: 10/06/2023 ID: Derek Jackson, DOB Aug 30, 1950, MRN [id number]  Primary Cardiologist:  Belva Boyden, MD  Chief Complaint   Derek Jackson is a 73 y.o. male who presents to the clinic today for routine annual follow up.   Patient Profile   Derek Jackson is followed by Dr. Gollan for the history outlined below.      Past medical history significant for: Chronic systolic heart failure. Echo 03/18/2022 performed at [facility]: EF 45%.  Mild concentric LVH.  Grade I DD.  Normal RV function.  Mild MR.  Trivial AI.  Unchanged from April 2021. LBBB. Hepatic cysts. Aorta duplex 04/16/2022: Maximum aortic diameter 3.1 cm.  Recommend follow-up ultrasound every 3 years.  6.1 cm hypoechoic mass in the right hepatic lobe/porta hepatis, stable to slightly decreased present on multiple prior and presumably benign.  6.4 cm solid-appearing left hepatic lobe mass recommend further assessment with MRI. Hyperlipidemia. Lipid panel 05/06/2023: LDL 77, HDL 46, TG 195, total 156.  In summary, patient was previously followed by Dr. Bary Likes at [facility].  He has history of prior catheterization in 2015 performed at an outside facility showing no significant coronary artery disease.  Echo in June 2017 showed EF 35%.  Repeat echo July 2018, March 2020, April 2021 demonstrated EF 45%.  Aortic duplex August 2023 showed mass.  Further assessment with MRI showed no concerning liver lesions and stable hepatic cysts.  Patient was first evaluated by Dr. Gollan on 10/01/2022 to establish care as requested by Dr. Shann Darnel.  Patient reported dyspnea when first getting up in the morning but no significant symptoms throughout the day.  He was euvolemic on exam and no further testing was indicated.  BP was well-controlled.  Patient was last seen in the office by Dr. Gollan on 04/07/2023 for routine follow-up.  He was doing well at that time with continued chronic mild  shortness of breath with heavy exertion.  Metoprolol  tartrate was changed to metoprolol  succinate.  He declines SGLT2i.  He reported rare sensation of fluttering lasting less than a minute occurring once a month or less.  No further testing was indicated.     History of Present Illness    Today, patient is doing well. Patient denies shortness of breath, dyspnea on exertion, lower extremity edema, orthopnea or PND. He keeps a close watch for edema. No chest pain, pressure, or tightness. No palpitations.  He walks 2 miles a day weather permitting. He mentions his brother recently had a carotid procedure "to blow out some blockages." He denies lightheadedness, dizziness, presyncope or syncope.     ROS: All other systems reviewed and are otherwise negative except as noted in History of Present Illness.  EKGs/Labs Reviewed    EKG Interpretation Date/Time:  Monday October 06 2023 08:28:46 EST Ventricular Rate:  68 PR Interval:  154 QRS Duration:  164 QT Interval:  456 QTC Calculation: 484 R Axis:   -11  Text Interpretation: Normal sinus rhythm Left bundle branch block When compared with ECG of 07-Apr-2023 08:07, No significant change was found Confirmed by Morey Ar [phone]) on 10/06/2023 8:44:39 AM   05/06/2023: ALT 53; AST 41; BUN 12; Creatinine, Ser 0.75; Potassium 4.9; Sodium 137   05/06/2023: Hemoglobin 14.3; WBC 6.7    Physical Exam    VS:  BP 118/80   Pulse 68   Ht 6\' 1"  (1.854 m)   Wt 220 lb 9.6 oz (100.1 kg)   SpO2  98%   BMI 29.10 kg/m  , BMI Body mass index is 29.1 kg/m.  GEN: Well nourished, well developed, in no acute distress. Neck: No JVD or carotid bruits. Cardiac:  RRR. No murmurs. No rubs or gallops.   Respiratory:  Respirations regular and unlabored. Clear to auscultation without rales, wheezing or rhonchi. GI: Soft, nontender, nondistended. Extremities: Radials/DP/PT 2+ and equal bilaterally. No clubbing or cyanosis. No edema.  Skin: Warm and dry, no  rash. Neuro: Strength intact.  Assessment & Plan   Chronic HFmrEF Echo July 2023 performed at [facility] showed EF 45%, Grade I DD, normal RV function, mild MR, trivial AI.  Patient denies DOE, lower extremity edema, orthopnea or PND.  Euvolemic and well compensated on exam. -Continue lisinopril , Toprol , spironolactone . Not interested in starting SGLT2i.   LBBB EKG today shows NSR with LBBB unchanged from previous. He denies chest pain, pressure or tightness.  -No cardiac testing indicated.   Hyperlipidemia LDL September 2024 77, at goal. -Continue rosuvastatin .  Hypertension BP today 118/80. No report of headache or dizziness.  -Continue lisinopril , Toprol , spironolactone .  Disposition: Return in 1 year or sooner as needed.          Signed, Lonell Rives. Odesser Tourangeau, DNP, NP-C

## 2023-10-06 ENCOUNTER — Encounter: Payer: Self-pay | Admitting: Student

## 2023-10-06 ENCOUNTER — Ambulatory Visit: Payer: PPO | Attending: Student | Admitting: Student

## 2023-10-06 VITALS — BP 118/80 | HR 68 | Ht 73.0 in | Wt 220.6 lb

## 2023-10-06 DIAGNOSIS — I10 Essential (primary) hypertension: Secondary | ICD-10-CM

## 2023-10-06 DIAGNOSIS — I447 Left bundle-branch block, unspecified: Secondary | ICD-10-CM | POA: Diagnosis not present

## 2023-10-06 DIAGNOSIS — E7800 Pure hypercholesterolemia, unspecified: Secondary | ICD-10-CM

## 2023-10-06 DIAGNOSIS — I5022 Chronic systolic (congestive) heart failure: Secondary | ICD-10-CM | POA: Diagnosis not present

## 2023-10-06 NOTE — Patient Instructions (Signed)
 Medication Instructions:  No changes at this time.   *If you need a refill on your cardiac medications before your next appointment, please call your pharmacy*   Lab Work: None  If you have labs (blood work) drawn today and your tests are completely normal, you will receive your results only by: MyChart Message (if you have MyChart) OR A paper copy in the mail If you have any lab test that is abnormal or we need to change your treatment, we will call you to review the results.   Testing/Procedures: None   Follow-Up: At [facility], you and your health needs are our priority.  As part of our continuing mission to provide you with exceptional heart care, we have created designated Provider Care Teams.  These Care Teams include your primary Cardiologist (physician) and Advanced Practice Providers (APPs -  Physician Assistants and Nurse Practitioners) who all work together to provide you with the care you need, when you need it.   Your next appointment:   1 year(s)  Provider:   Belva Boyden, MD or Morey Ar, NP

## 2023-11-17 ENCOUNTER — Telehealth: Payer: Self-pay | Admitting: Cardiovascular Disease

## 2023-11-17 ENCOUNTER — Other Ambulatory Visit: Payer: Self-pay | Admitting: Cardiovascular Disease

## 2023-11-17 NOTE — Telephone Encounter (Signed)
*  STAT* If patient is at the pharmacy, call can be transferred to refill team.   1. Which medications need to be refilled? (please list name of each medication and dose if known) lisinopril (ZESTRIL) 20 MG tablet    2. Would you like to learn more about the convenience, safety, & potential cost savings by using the [facility]? No    3. Are you open to using the [facility] (Type [facility]. ). No   4. Which pharmacy/location (including street and city if local pharmacy) is medication to be sent to? [id number] [address], [place] [phone]    5. Do they need a 30 day or 90 day supply? 90 day

## 2023-11-21 DIAGNOSIS — D2261 Melanocytic nevi of right upper limb, including shoulder: Secondary | ICD-10-CM | POA: Diagnosis not present

## 2023-11-21 DIAGNOSIS — L565 Disseminated superficial actinic porokeratosis (DSAP): Secondary | ICD-10-CM | POA: Diagnosis not present

## 2023-11-21 DIAGNOSIS — D2262 Melanocytic nevi of left upper limb, including shoulder: Secondary | ICD-10-CM | POA: Diagnosis not present

## 2023-11-21 DIAGNOSIS — L570 Actinic keratosis: Secondary | ICD-10-CM | POA: Diagnosis not present

## 2023-11-21 DIAGNOSIS — Z85828 Personal history of other malignant neoplasm of skin: Secondary | ICD-10-CM | POA: Diagnosis not present

## 2023-11-21 DIAGNOSIS — D225 Melanocytic nevi of trunk: Secondary | ICD-10-CM | POA: Diagnosis not present

## 2023-11-21 NOTE — Telephone Encounter (Signed)
 Disp Refills Start End   lisinopril (ZESTRIL) 20 MG tablet 90 tablet 3 11/17/2023 --   Sig - Route: TAKE 1 TABLET BY MOUTH EVERY DAY - Oral   Sent to pharmacy as: lisinopril (ZESTRIL) 20 MG tablet   E-Prescribing Status: Receipt confirmed by pharmacy (11/17/2023  3:07 PM EDT)

## 2023-12-19 ENCOUNTER — Other Ambulatory Visit: Payer: Self-pay | Admitting: Cardiovascular Disease

## 2024-01-05 DIAGNOSIS — H401132 Primary open-angle glaucoma, bilateral, moderate stage: Secondary | ICD-10-CM | POA: Diagnosis not present

## 2024-01-06 DIAGNOSIS — H401132 Primary open-angle glaucoma, bilateral, moderate stage: Secondary | ICD-10-CM | POA: Diagnosis not present

## 2024-01-06 DIAGNOSIS — H2513 Age-related nuclear cataract, bilateral: Secondary | ICD-10-CM | POA: Diagnosis not present

## 2024-03-15 ENCOUNTER — Other Ambulatory Visit: Payer: Self-pay | Admitting: Family Medicine

## 2024-03-15 DIAGNOSIS — R3911 Hesitancy of micturition: Secondary | ICD-10-CM

## 2024-03-15 NOTE — Telephone Encounter (Signed)
 Pt called to check the status of his refill, says he is almost out

## 2024-04-01 ENCOUNTER — Other Ambulatory Visit: Payer: Self-pay

## 2024-04-01 DIAGNOSIS — N401 Enlarged prostate with lower urinary tract symptoms: Secondary | ICD-10-CM | POA: Diagnosis not present

## 2024-04-02 ENCOUNTER — Ambulatory Visit (INDEPENDENT_AMBULATORY_CARE_PROVIDER_SITE_OTHER): Payer: Self-pay | Admitting: Urology

## 2024-04-02 ENCOUNTER — Encounter: Payer: Self-pay | Admitting: Urology

## 2024-04-02 VITALS — BP 116/76 | HR 78 | Ht 73.0 in | Wt 216.0 lb

## 2024-04-02 DIAGNOSIS — N401 Enlarged prostate with lower urinary tract symptoms: Secondary | ICD-10-CM | POA: Diagnosis not present

## 2024-04-02 DIAGNOSIS — Z87898 Personal history of other specified conditions: Secondary | ICD-10-CM | POA: Diagnosis not present

## 2024-04-02 LAB — PSA: Prostate Specific Ag, Serum: 3.8 ng/mL (ref 0.0–4.0)

## 2024-04-02 MED ORDER — TAMSULOSIN HCL 0.4 MG PO CAPS: 0.8000 mg | ORAL_CAPSULE | Freq: Every day | ORAL | 3 refills | Status: AC

## 2024-04-02 NOTE — Progress Notes (Signed)
 04/02/2024 9:24 AM   Derek Jackson [birth year] [id number]  Referring provider: Gasper Nancyann BRAVO, MD [address] [place],  [place] [address]  Chief Complaint  Patient presents with   Benign Prostatic Hyperplasia with lower urinary track sympt   Urologic history:  1.  Elevated PSA Prostate biopsy 03/2018; PSA 4.7; volume 53 cc; benign pathology   2.  BPH with lower urinary tract symptoms On tamsulosin    HPI: Derek Jackson is a 73 y.o. fe male  Worsening voiding symptoms at last year's visit.  He was interested in a trial of silodosin  however when he went to pick up that was going to be cost prohibitive and he remains on tamsulosin  Seems to have worsening obstructive voiding symptoms at nighttime Denies dysuria, gross hematuria No flank, abdominal pain PSA 04/01/2024 was 3.8   PSA trend   Prostate Specific Ag, Serum  Latest Ref Rng 0.0 - 4.0 ng/mL  05/20/2018 3.7   11/09/2018 4.3 (H)   02/07/2020 3.4   02/19/2021 3.1   03/11/2022 3.3   03/31/2023 3.2   04/01/2024 3.8      PMH: Past Medical History:  Diagnosis Date   BPH (benign prostatic hypertrophy)    COVID-19 04/15/2019   Esophagitis, reflux    Glaucoma    History of 2019 novel coronavirus disease (COVID-19) 04/20/2019   History of kidney stones    History of shingles 04/27/2015   Rosacea    Systolic CHF (HCC) 2016    Surgical History: Past Surgical History:  Procedure Laterality Date   Abdominal wall mass excision  2010   Dr. Dessa   CARDIAC CATHETERIZATION  07/2014   No blockages. per patient one side of heart was not functioning well, CHF-Dr. Hester   CHOLECYSTECTOMY  2005   Lapraroscopic   COLON SURGERY     COLONOSCOPY  [id number]   COLONOSCOPY WITH PROPOFOL  N/A 04/16/2017   Procedure: COLONOSCOPY WITH PROPOFOL ;  Surgeon: Dessa Reyes ORN, MD;  Location: [facility] ENDOSCOPY;  Service: Endoscopy;  Laterality: N/A;   COLONOSCOPY WITH PROPOFOL  N/A 04/24/2022   Procedure: COLONOSCOPY WITH  PROPOFOL ;  Surgeon: Dessa Reyes ORN, MD;  Location: [facility] ENDOSCOPY;  Service: Endoscopy;  Laterality: N/A;   CT Scan of Abdomen  12/05/2008   Soft Tissue mass 2.42cm x 3.98 cm of Abdominal wall lateral to umbilicus, pathology shows fibromatosis. Multiple large liver cysts up to 9cm x 9cm   ESOPHAGOGASTRODUODENOSCOPY (EGD) WITH PROPOFOL  N/A 04/24/2022   Procedure: ESOPHAGOGASTRODUODENOSCOPY (EGD) WITH PROPOFOL ;  Surgeon: Dessa Reyes ORN, MD;  Location: [facility] ENDOSCOPY;  Service: Endoscopy;  Laterality: N/A;   KNEE SURGERY  1994   Arthroscopy. Surgery by Dr. Mardee   RIGHT COLECTOMY  2006   Dr.Byrnett   TONSILLECTOMY     TONSILLECTOMY AND ADENOIDECTOMY  1960's   UPPER GI ENDOSCOPY  2010    Home Medications:  Allergies as of 04/02/2024   No Known Allergies      Medication List        Accurate as of April 02, 2024  9:24 AM. If you have any questions, ask your nurse or doctor.          dorzolamide-timolol 2-0.5 % ophthalmic solution Commonly known as: COSOPT 1 drop 2 (two) times daily.   lisinopril  20 MG tablet Commonly known as: ZESTRIL  TAKE 1 TABLET BY MOUTH EVERY DAY   MENS 50+ MULTI VITAMIN/MIN PO Take by mouth daily.   metoprolol  succinate 25 MG 24 hr tablet Commonly known as: TOPROL -XL Take  1 tablet (25 mg total) by mouth daily. Take with or immediately following a meal.   omeprazole  40 MG capsule Commonly known as: PRILOSEC TAKE ONE CAPSULE BY MOUTH ONCE DAILY   OVER THE COUNTER MEDICATION 1 capsule daily. Take one capsule by mouth daily   rosuvastatin  5 MG tablet Commonly known as: CRESTOR  Take 1 tablet (5 mg total) by mouth daily.   spironolactone  25 MG tablet Commonly known as: ALDACTONE  TAKE 1/2 TABLET BY MOUTH DAILY   tamsulosin  0.4 MG Caps capsule Commonly known as: FLOMAX  TAKE 1 CAPSULE BY MOUTH EVERY DAY        Allergies: No Known Allergies  Family History: Family History  Problem Relation Age of Onset   Colon polyps Father     Diabetes Father    CAD Father        had CABG   Stroke Father    Dementia Father    Congestive Heart Failure Mother    Heart attack Maternal Grandfather    Diabetes Paternal Grandmother    Heart attack Paternal Grandfather    Heart disease Brother        also had history of VSD   Aneurysm Brother    Prostate cancer Neg Hx    Bladder Cancer Neg Hx    Kidney cancer Neg Hx     Social History:  reports that he quit smoking about 33 years ago. His smoking use included cigarettes. He started smoking about 41 years ago. He has a 8 pack-year smoking history. He has never used smokeless tobacco. He reports current alcohol use. He reports that he does not use drugs.   Physical Exam: BP 116/76 (BP Location: Left Arm, Patient Position: Sitting, Cuff Size: Normal)   Pulse 78   Ht 6' 1 (1.854 m)   Wt 216 lb (98 kg)   BMI 28.50 kg/m   Constitutional:  Alert and oriented, No acute distress. HEENT: [place] AT Respiratory: Normal respiratory effort, no increased work of breathing. Psychiatric: Normal mood and affect.   Assessment & Plan:    BPH with LUTS Will increase tamsulosin  to 0.4 mg twice daily Continue annual follow-up  2.  History elevated PSA Stable PSA   Glendia JAYSON Barba, MD  [facility] [address] [place], [place] [address] [phone]

## 2024-04-21 ENCOUNTER — Other Ambulatory Visit: Payer: Self-pay | Admitting: Cardiovascular Disease

## 2024-05-10 ENCOUNTER — Ambulatory Visit (INDEPENDENT_AMBULATORY_CARE_PROVIDER_SITE_OTHER): Payer: Self-pay | Admitting: Family Medicine

## 2024-05-10 VITALS — BP 113/76 | HR 64 | Resp 16 | Ht 73.0 in | Wt 218.0 lb

## 2024-05-10 DIAGNOSIS — Z23 Encounter for immunization: Secondary | ICD-10-CM

## 2024-05-10 DIAGNOSIS — I77819 Aortic ectasia, unspecified site: Secondary | ICD-10-CM

## 2024-05-10 DIAGNOSIS — K760 Fatty (change of) liver, not elsewhere classified: Secondary | ICD-10-CM

## 2024-05-10 DIAGNOSIS — Z0000 Encounter for general adult medical examination without abnormal findings: Secondary | ICD-10-CM | POA: Diagnosis not present

## 2024-05-10 DIAGNOSIS — I10 Essential (primary) hypertension: Secondary | ICD-10-CM

## 2024-05-10 DIAGNOSIS — I5022 Chronic systolic (congestive) heart failure: Secondary | ICD-10-CM

## 2024-05-10 DIAGNOSIS — E782 Mixed hyperlipidemia: Secondary | ICD-10-CM

## 2024-05-10 DIAGNOSIS — R7303 Prediabetes: Secondary | ICD-10-CM

## 2024-05-10 NOTE — Progress Notes (Signed)
 Complete physical exam   Patient: Derek Jackson   DOB: [birth year]   73 y.o. Male  MRN: [id number] Visit Date: 05/10/2024  Today's healthcare provider: Nancyann Perry, MD   Chief Complaint  Patient presents with   Annual Exam    CPE/   Subjective    Discussed the use of AI scribe software for clinical note transcription with the patient, who gave verbal consent to proceed.  History of Present Illness   Derek Jackson is a 73 year old male who presents for an annual physical exam.  He feels well overall and maintains regular physical activity, walking about two miles daily, weather permitting. He recently had satisfactory urology and audiology check-ups, with follow-ups scheduled for next year.  He has a history of polyps and is due for a colonoscopy next year, following a three-year surveillance interval due to the number of polyps found during his last procedure in 2023. He recalls issues with insurance coverage for the anesthesiologist during his last colonoscopy.  No chest pain or heart flutters. His last cardiac evaluation, including an EKG, was normal. He takes Metamucil and fiber supplements, which have helped with his bowel movements. No stomach problems, cramping, or changes in bowel habits.  He received a flu shot recently and had the shingles vaccine last year. He does not plan to get a COVID shot this year.  No swelling in his hands, feet, or ankles. He mentions a past episode of feeling terrible in October but does not provide further details.  The ringing in his ears has resolved after treatment for ear itching, which was successful.     He continues to follow up Gollan for CHF and Dr. Twylla for surveillance of elevated PSA.  Lab Results  Component Value Date   HGBA1C 6.0 (H) 05/06/2023   Lab Results  Component Value Date   NA 137 05/06/2023   K 4.9 05/06/2023   CREATININE 0.75 (L) 05/06/2023   EGFR 96 05/06/2023   GLUCOSE 120 (H) 05/06/2023   Lab  Results  Component Value Date   CHOL 156 05/06/2023   HDL 46 05/06/2023   LDLCALC 77 05/06/2023   TRIG 195 (H) 05/06/2023   CHOLHDL 3.4 05/06/2023   Lab Results  Component Value Date   PSA1 3.8 04/01/2024   PSA1 3.2 03/31/2023   PSA1 3.3 03/11/2022   PSA 2.3 12/14/2014        Past Medical History:  Diagnosis Date   BPH (benign prostatic hypertrophy)    COVID-19 04/15/2019   Esophagitis, reflux    Glaucoma    History of 2019 novel coronavirus disease (COVID-19) 04/20/2019   History of kidney stones    History of shingles 04/27/2015   Rosacea    Systolic CHF (HCC) 2016   Past Surgical History:  Procedure Laterality Date   Abdominal wall mass excision  2010   Dr. Dessa   CARDIAC CATHETERIZATION  07/2014   No blockages. per patient one side of heart was not functioning well, CHF-Dr. Hester   CHOLECYSTECTOMY  2005   Lapraroscopic   COLON SURGERY     COLONOSCOPY  [id number]   COLONOSCOPY WITH PROPOFOL  N/A 04/16/2017   Procedure: COLONOSCOPY WITH PROPOFOL ;  Surgeon: Dessa Reyes ORN, MD;  Location: [facility] ENDOSCOPY;  Service: Endoscopy;  Laterality: N/A;   COLONOSCOPY WITH PROPOFOL  N/A 04/24/2022   Procedure: COLONOSCOPY WITH PROPOFOL ;  Surgeon: Dessa Reyes ORN, MD;  Location: [facility] ENDOSCOPY;  Service: Endoscopy;  Laterality: N/A;   CT  Scan of Abdomen  12/05/2008   Soft Tissue mass 2.42cm x 3.98 cm of Abdominal wall lateral to umbilicus, pathology shows fibromatosis. Multiple large liver cysts up to 9cm x 9cm   ESOPHAGOGASTRODUODENOSCOPY (EGD) WITH PROPOFOL  N/A 04/24/2022   Procedure: ESOPHAGOGASTRODUODENOSCOPY (EGD) WITH PROPOFOL ;  Surgeon: Dessa Reyes ORN, MD;  Location: [facility] ENDOSCOPY;  Service: Endoscopy;  Laterality: N/A;   KNEE SURGERY  1994   Arthroscopy. Surgery by Dr. Mardee   RIGHT COLECTOMY  2006   Dr.Byrnett   TONSILLECTOMY     TONSILLECTOMY AND ADENOIDECTOMY  1960's   UPPER GI ENDOSCOPY  2010   Social History   Socioeconomic History   Marital  status: Significant Other    Spouse name: Not on file   Number of children: 3   Years of education: Not on file   Highest education level: Some college, no degree  Occupational History   Occupation: Employed    Comment: Works at [employer]: part time  Tobacco Use   Smoking status: Former    Current packs/day: 0.00    Average packs/day: 1 pack/day for 8.0 years (8.0 ttl pk-yrs)    Types: Cigarettes    Start date: 04/26/1982    Quit date: 04/26/1990    Years since quitting: 34.0   Smokeless tobacco: Never  Vaping Use   Vaping status: Never Used  Substance and Sexual Activity   Alcohol use: Yes    Alcohol/week: 0.0 standard drinks of alcohol    Comment: 6 pack a month/socially   Drug use: No   Sexual activity: Yes    Birth control/protection: None  Other Topics Concern   Not on file  Social History Narrative   Retired April 2017, still working 3 days a week   Social Drivers of Corporate investment banker Strain: Low Risk  (01/31/2022)   Overall Financial Resource Strain (CARDIA)    Difficulty of Paying Living Expenses: Not hard at all  Food Insecurity: No Food Insecurity (01/31/2022)   Hunger Vital Sign    Worried About Running Out of Food in the Last Year: Never true    Ran Out of Food in the Last Year: Never true  Transportation Needs: No Transportation Needs (01/31/2022)   PRAPARE - Administrator, Civil Service (Medical): No    Lack of Transportation (Non-Medical): No  Physical Activity: Sufficiently Active (01/31/2022)   Exercise Vital Sign    Days of Exercise per Week: 6 days    Minutes of Exercise per Session: 40 min  Stress: No Stress Concern Present (01/31/2022)   [employer] of Occupational Health - Occupational Stress Questionnaire    Feeling of Stress : Not at all  Social Connections: Moderately Isolated (01/31/2022)   Social Connection and Isolation Panel    Frequency of Communication with Friends and Family: Three times a  week    Frequency of Social Gatherings with Friends and Family: Once a week    Attends Religious Services: Never    Database administrator or Organizations: Not on file    Attends Banker Meetings: Never    Marital Status: Living with partner  Intimate Partner Violence: Not At Risk (01/31/2022)   Humiliation, Afraid, Rape, and Kick questionnaire    Fear of Current or Ex-Partner: No    Emotionally Abused: No    Physically Abused: No    Sexually Abused: No   Family Status  Relation Name Status   Father  Deceased at  age 33       Cause of death: CVA   Mother  Deceased at age 41       Cause of Death: CHF. was also prediabetic   MGF  Deceased       MI   PGM  Deceased       Cause of death: Complications of Diabetes   PGF  Deceased       MI   Brother  Deceased at age 79       Cause of Death: Heart Disease   Sister  Alive   Brother  Alive       PRE- Diabetes   Daughter  Alive       has had surgery for VSD   MGM  Deceased       OLD age   Neg Hx  (Not Specified)  No partnership data on file   Family History  Problem Relation Age of Onset   Colon polyps Father    Diabetes Father    CAD Father        had CABG   Stroke Father    Dementia Father    Congestive Heart Failure Mother    Heart attack Maternal Grandfather    Diabetes Paternal Grandmother    Heart attack Paternal Grandfather    Heart disease Brother        also had history of VSD   Aneurysm Brother    Prostate cancer Neg Hx    Bladder Cancer Neg Hx    Kidney cancer Neg Hx    No Known Allergies  Patient Care Team: Gasper Nancyann BRAVO, MD as PCP - General (Family Medicine) Perla, Evalene PARAS, MD as PCP - Cardiology (Cardiology) Byrnett, Reyes ORN, MD (General Surgery) Twylla Glendia BROCKS, MD as Consulting Physician (Urology) [facility] (Optometry) Ferol Rogue, MD (Inactive) as Consulting Physician (Ophthalmology) Isenstein, Arin L, MD (Dermatology) Gollan, Timothy J, MD as Consulting  Physician (Cardiology)   Medications: Outpatient Medications Prior to Visit  Medication Sig   dorzolamide-timolol (COSOPT) 22.3-6.8 MG/ML ophthalmic solution 1 drop 2 (two) times daily.   lisinopril  (ZESTRIL ) 20 MG tablet TAKE 1 TABLET BY MOUTH EVERY DAY   metoprolol  succinate (TOPROL -XL) 25 MG 24 hr tablet TAKE 1 TABLET BY MOUTH EVERY DAY WITH OR IMMEDIATELY FOLLOWING A MEAL   Multiple Vitamins-Minerals (MENS 50+ MULTI VITAMIN/MIN PO) Take by mouth daily.   omeprazole  (PRILOSEC) 40 MG capsule TAKE ONE CAPSULE BY MOUTH ONCE DAILY   OVER THE COUNTER MEDICATION 1 capsule daily. Take one capsule by mouth daily   rosuvastatin  (CRESTOR ) 5 MG tablet Take 1 tablet (5 mg total) by mouth daily.   spironolactone  (ALDACTONE ) 25 MG tablet TAKE 1/2 TABLET BY MOUTH DAILY   tamsulosin  (FLOMAX ) 0.4 MG CAPS capsule Take 2 capsules (0.8 mg total) by mouth daily.   No facility-administered medications prior to visit.    Review of Systems  Constitutional:  Negative for appetite change, chills and fever.  Respiratory:  Negative for chest tightness, shortness of breath and wheezing.   Cardiovascular:  Negative for chest pain and palpitations.  Gastrointestinal:  Negative for abdominal pain, nausea and vomiting.      Objective    BP 113/76 (BP Location: Right Arm, Patient Position: Sitting, Cuff Size: Normal)   Pulse 64   Resp 16   Ht 6' 1 (1.854 m)   Wt 218 lb (98.9 kg)   SpO2 98%   BMI 28.76 kg/m    Physical Exam  General Appearance:    Well developed, well nourished male. Alert, cooperative, in no acute distress, appears stated age  Head:    Normocephalic, without obvious abnormality, atraumatic  Eyes:    PERRL, conjunctiva/corneas clear, EOM's intact, fundi    benign, both eyes       Ears:    Normal TM's and external ear canals, both ears  Nose:   Nares normal, septum midline, mucosa normal, no drainage   or sinus tenderness  Throat:   Lips, mucosa, and tongue normal; teeth and gums  normal  Neck:   Supple, symmetrical, trachea midline, no adenopathy;       thyroid :  No enlargement/tenderness/nodules; no carotid   bruit or JVD  Back:     Symmetric, no curvature, ROM normal, no CVA tenderness  Lungs:     Clear to auscultation bilaterally, respirations unlabored  Chest wall:    No tenderness or deformity  Heart:    Normal heart rate. Normal rhythm. No murmurs, rubs, or gallops.  S1 and S2 normal  Abdomen:     Soft, non-tender, bowel sounds active all four quadrants,    no masses, no organomegaly  Genitalia:    deferred  Rectal:    deferred  Extremities:   All extremities are intact. No cyanosis or edema  Pulses:   2+ and symmetric all extremities  Skin:   Skin color, texture, turgor normal, no rashes or lesions  Lymph nodes:   Cervical, supraclavicular, and axillary nodes normal  Neurologic:   CNII-XII intact. Normal strength, sensation and reflexes      throughout       Last depression screening scores    05/10/2024    8:23 AM 04/22/2022    2:15 PM 01/31/2022    2:38 PM  PHQ 2/9 Scores  PHQ - 2 Score 0 1 0  PHQ- 9 Score 0 2 0   Last fall risk screening    09/25/2022    1:10 PM  Fall Risk   Falls in the past year? 0  Risk for fall due to : No Fall Risks  Follow up Falls evaluation completed   Last Audit-C alcohol use screening    01/31/2022    2:37 PM  Alcohol Use Disorder Test (AUDIT)  1. How often do you have a drink containing alcohol? 1  2. How many drinks containing alcohol do you have on a typical day when you are drinking? 0  3. How often do you have six or more drinks on one occasion? 0  AUDIT-C Score 1   A score of 3 or more in women, and 4 or more in men indicates increased risk for alcohol abuse, EXCEPT if all of the points are from question 1     Assessment & Plan    Routine Health Maintenance and Physical Exam  Exercise Activities and Dietary recommendations  Goals      DIET - EAT MORE FRUITS AND VEGETABLES     DIET - INCREASE  WATER INTAKE     Recommend increasing water intake to 4 glasses a day.          Immunization History  Administered Date(s) Administered   Fluad Quad(high Dose 65+) 05/06/2023   H1N1 07/13/2008   INFLUENZA, HIGH DOSE SEASONAL PF 06/11/2019, 05/15/2020, 05/10/2024   Influenza-Unspecified 06/04/2018, 06/05/2022   PFIZER(Purple Top)SARS-COV-2 Vaccination 10/11/2019, 11/01/2019, 06/12/2020   Pneumococcal Conjugate-13 12/18/2015   Pneumococcal Polysaccharide-23 12/23/2016   Td 03/21/1999, 11/25/2003   Zoster, Live 10/22/2010  Health Maintenance  Topic Date Due   DTaP/Tdap/Td (3 - Tdap) 11/24/2013   COVID-19 Vaccine (4 - 2025-26 season) 04/26/2024   Medicare Annual Wellness (AWV)  05/05/2024   Colonoscopy  04/24/2025   Pneumococcal Vaccine: 50+ Years  Completed   Influenza Vaccine  Completed   Hepatitis C Screening  Completed   Zoster Vaccines- Shingrix  Completed   HPV VACCINES  Aged Out   Meningococcal B Vaccine  Aged Out    Discussed health benefits of physical activity, and encouraged him to engage in regular exercise appropriate for his age and condition. He does not want to get recommended Covid Vaccine. States had two dose of Shingles vaccine at [facility] pharmacy a year or two ago.   Will refer for colonoscopy next spring, which is due in Sept, 2026   2. Primary hypertension Well controlled.  Continue current medications.    3. Aortic dilatation (HCC) Aortic ultrasound due 04/2025  4. Hyperlipemia, mixed He is tolerating rosuvastatin  well with no adverse effects.   - CBC - Comprehensive metabolic panel with GFR - Lipid panel  5. Prediabetes  - Hemoglobin A1c  6. Chronic systolic congestive heart failure (HCC) Well compensated, followed by Dr. Gollan. Continue current medications.    7. Hepatic steatosis Checking liver functions today.   Other orders - Flu vaccine HIGH DOSE PF(Fluzone Trivalent)    Return in about 1 year (around 05/10/2025) for  prediabetes, Yearly Physical.        Nancyann Perry, MD  [facility] [phone] (phone) [phone] (fax)  [facility]

## 2024-05-10 NOTE — Patient Instructions (Addendum)
Please review the attached list of medications and notify my office if there are any errors.   You are due for a Tdap (tetanus-diptheria-pertussis vaccine) which protects you from tetanus and whooping cough. Please check with your insurance plan or pharmacy regarding coverage for this vaccine.   

## 2024-05-11 ENCOUNTER — Ambulatory Visit: Payer: Self-pay | Admitting: Family Medicine

## 2024-05-11 LAB — CBC
Hematocrit: 40.7 % (ref 37.5–51.0)
Hemoglobin: 13.6 g/dL (ref 13.0–17.7)
MCH: 31.3 pg (ref 26.6–33.0)
MCHC: 33.4 g/dL (ref 31.5–35.7)
MCV: 94 fL (ref 79–97)
Platelets: 277 x10E3/uL (ref 150–450)
RBC: 4.35 x10E6/uL (ref 4.14–5.80)
RDW: 13 % (ref 11.6–15.4)
WBC: 6.7 x10E3/uL (ref 3.4–10.8)

## 2024-05-11 LAB — COMPREHENSIVE METABOLIC PANEL WITH GFR
ALT: 72 IU/L — ABNORMAL HIGH (ref 0–44)
AST: 52 IU/L — ABNORMAL HIGH (ref 0–40)
Albumin: 4.4 g/dL (ref 3.8–4.8)
Alkaline Phosphatase: 89 IU/L (ref 49–135)
BUN/Creatinine Ratio: 16 (ref 10–24)
BUN: 16 mg/dL (ref 8–27)
Bilirubin Total: 0.7 mg/dL (ref 0.0–1.2)
CO2: 21 mmol/L (ref 20–29)
Calcium: 9.7 mg/dL (ref 8.6–10.2)
Chloride: 104 mmol/L (ref 96–106)
Creatinine, Ser: 0.98 mg/dL (ref 0.76–1.27)
Globulin, Total: 1.6 g/dL (ref 1.5–4.5)
Glucose: 130 mg/dL — ABNORMAL HIGH (ref 70–99)
Potassium: 4.7 mmol/L (ref 3.5–5.2)
Sodium: 139 mmol/L (ref 134–144)
Total Protein: 6 g/dL (ref 6.0–8.5)
eGFR: 81 mL/min/1.73 (ref >59)

## 2024-05-11 LAB — LIPID PANEL
Chol/HDL Ratio: 2.8 ratio (ref 0.0–5.0)
Cholesterol, Total: 142 mg/dL (ref 100–199)
HDL: 51 mg/dL (ref >39)
LDL Chol Calc (NIH): 69 mg/dL (ref 0–99)
Triglycerides: 126 mg/dL (ref 0–149)
VLDL Cholesterol Cal: 22 mg/dL (ref 5–40)

## 2024-05-11 LAB — HEMOGLOBIN A1C
Est. average glucose Bld gHb Est-mCnc: 128 mg/dL
Hgb A1c MFr Bld: 6.1 % — ABNORMAL HIGH (ref 4.8–5.6)

## 2024-06-09 ENCOUNTER — Other Ambulatory Visit: Payer: Self-pay | Admitting: Cardiovascular Disease

## 2024-07-01 ENCOUNTER — Other Ambulatory Visit: Payer: Self-pay | Admitting: Family Medicine

## 2024-07-01 DIAGNOSIS — K2100 Gastro-esophageal reflux disease with esophagitis, without bleeding: Secondary | ICD-10-CM

## 2024-09-04 ENCOUNTER — Other Ambulatory Visit: Payer: Self-pay | Admitting: Cardiovascular Disease

## 2024-10-25 ENCOUNTER — Ambulatory Visit: Admitting: Cardiovascular Disease

## 2025-03-31 ENCOUNTER — Other Ambulatory Visit

## 2025-04-08 ENCOUNTER — Ambulatory Visit: Admitting: Urology

## 2025-05-16 ENCOUNTER — Encounter: Admitting: Family Medicine
